# Patient Record
Sex: Female | Born: 1935 | Race: White | Hispanic: No | Marital: Single | Smoking: Never smoker
Health system: Southern US, Community
[De-identification: ages and names within clinical notes are randomized; demographics above are authoritative.]

## PROBLEM LIST (undated history)

## (undated) DIAGNOSIS — C55 Malignant neoplasm of uterus, part unspecified: Secondary | ICD-10-CM

## (undated) DIAGNOSIS — E039 Hypothyroidism, unspecified: Secondary | ICD-10-CM

## (undated) DIAGNOSIS — I6529 Occlusion and stenosis of unspecified carotid artery: Secondary | ICD-10-CM

## (undated) DIAGNOSIS — Z923 Personal history of irradiation: Secondary | ICD-10-CM

## (undated) DIAGNOSIS — G319 Degenerative disease of nervous system, unspecified: Secondary | ICD-10-CM

## (undated) DIAGNOSIS — N189 Chronic kidney disease, unspecified: Secondary | ICD-10-CM

## (undated) HISTORY — PX: EYE SURGERY: SHX253

## (undated) HISTORY — PX: ABDOMINAL HYSTERECTOMY: SHX81

## (undated) HISTORY — PX: THYROID SURGERY: SHX805

## (undated) HISTORY — DX: Malignant neoplasm of uterus, part unspecified: C55

---

## 1998-06-21 ENCOUNTER — Other Ambulatory Visit: Admission: RE | Admit: 1998-06-21 | Discharge: 1998-06-21 | Payer: Self-pay | Admitting: Family Medicine

## 2004-12-27 ENCOUNTER — Ambulatory Visit: Payer: Self-pay | Admitting: Internal Medicine

## 2005-01-27 ENCOUNTER — Other Ambulatory Visit: Payer: Self-pay

## 2005-01-27 ENCOUNTER — Emergency Department: Payer: Self-pay | Admitting: General Practice

## 2005-11-15 ENCOUNTER — Encounter: Payer: Self-pay | Admitting: Unknown Physician Specialty

## 2005-11-19 ENCOUNTER — Encounter: Payer: Self-pay | Admitting: Unknown Physician Specialty

## 2005-12-29 ENCOUNTER — Ambulatory Visit: Payer: Self-pay | Admitting: Internal Medicine

## 2006-01-08 ENCOUNTER — Ambulatory Visit: Payer: Self-pay | Admitting: Internal Medicine

## 2006-04-24 ENCOUNTER — Ambulatory Visit: Payer: Self-pay | Admitting: Gastroenterology

## 2006-07-13 ENCOUNTER — Ambulatory Visit: Payer: Self-pay | Admitting: Internal Medicine

## 2006-12-05 ENCOUNTER — Encounter: Payer: Self-pay | Admitting: Unknown Physician Specialty

## 2006-12-27 ENCOUNTER — Encounter: Payer: Self-pay | Admitting: Unknown Physician Specialty

## 2007-01-07 ENCOUNTER — Ambulatory Visit: Payer: Self-pay | Admitting: Internal Medicine

## 2007-01-08 ENCOUNTER — Ambulatory Visit: Payer: Self-pay | Admitting: Ophthalmology

## 2007-01-21 ENCOUNTER — Encounter: Payer: Self-pay | Admitting: Unknown Physician Specialty

## 2008-01-08 ENCOUNTER — Ambulatory Visit: Payer: Self-pay | Admitting: Internal Medicine

## 2008-11-18 ENCOUNTER — Ambulatory Visit: Payer: Self-pay | Admitting: Internal Medicine

## 2008-11-24 ENCOUNTER — Ambulatory Visit: Payer: Self-pay | Admitting: Unknown Physician Specialty

## 2009-01-11 ENCOUNTER — Ambulatory Visit: Payer: Self-pay | Admitting: Internal Medicine

## 2009-04-22 ENCOUNTER — Ambulatory Visit: Payer: Self-pay | Admitting: Ophthalmology

## 2009-04-28 ENCOUNTER — Ambulatory Visit: Payer: Self-pay | Admitting: Ophthalmology

## 2009-07-13 ENCOUNTER — Ambulatory Visit: Payer: Self-pay | Admitting: Gastroenterology

## 2009-08-09 ENCOUNTER — Other Ambulatory Visit: Payer: Self-pay | Admitting: Gastroenterology

## 2010-01-14 ENCOUNTER — Ambulatory Visit: Payer: Self-pay | Admitting: Internal Medicine

## 2011-01-16 ENCOUNTER — Ambulatory Visit: Payer: Self-pay | Admitting: Internal Medicine

## 2012-01-23 ENCOUNTER — Ambulatory Visit: Payer: Self-pay | Admitting: Internal Medicine

## 2013-01-21 DIAGNOSIS — R498 Other voice and resonance disorders: Secondary | ICD-10-CM | POA: Insufficient documentation

## 2013-01-23 ENCOUNTER — Ambulatory Visit: Payer: Self-pay | Admitting: Internal Medicine

## 2013-11-02 DIAGNOSIS — I739 Peripheral vascular disease, unspecified: Secondary | ICD-10-CM

## 2013-11-02 DIAGNOSIS — G250 Essential tremor: Secondary | ICD-10-CM | POA: Insufficient documentation

## 2013-11-02 DIAGNOSIS — I779 Disorder of arteries and arterioles, unspecified: Secondary | ICD-10-CM | POA: Insufficient documentation

## 2013-11-02 DIAGNOSIS — E039 Hypothyroidism, unspecified: Secondary | ICD-10-CM | POA: Insufficient documentation

## 2013-11-02 DIAGNOSIS — G629 Polyneuropathy, unspecified: Secondary | ICD-10-CM | POA: Insufficient documentation

## 2013-11-18 DIAGNOSIS — N183 Chronic kidney disease, stage 3 unspecified: Secondary | ICD-10-CM | POA: Insufficient documentation

## 2014-01-27 ENCOUNTER — Ambulatory Visit: Payer: Self-pay | Admitting: Internal Medicine

## 2014-12-01 DIAGNOSIS — F5101 Primary insomnia: Secondary | ICD-10-CM | POA: Insufficient documentation

## 2014-12-07 ENCOUNTER — Encounter: Payer: Self-pay | Admitting: *Deleted

## 2014-12-08 ENCOUNTER — Ambulatory Visit: Payer: Medicare Other | Admitting: Anesthesiology

## 2014-12-08 ENCOUNTER — Encounter: Payer: Self-pay | Admitting: Anesthesiology

## 2014-12-08 ENCOUNTER — Ambulatory Visit
Admission: RE | Admit: 2014-12-08 | Discharge: 2014-12-08 | Disposition: A | Discharge status: Home / Self Care | Payer: Medicare Other | Source: Ambulatory Visit | Attending: Gastroenterology | Admitting: Gastroenterology

## 2014-12-08 ENCOUNTER — Encounter
Admission: RE | Disposition: A | Discharge status: Home / Self Care | Payer: Self-pay | Source: Ambulatory Visit | Attending: Gastroenterology

## 2014-12-08 DIAGNOSIS — G319 Degenerative disease of nervous system, unspecified: Secondary | ICD-10-CM | POA: Diagnosis not present

## 2014-12-08 DIAGNOSIS — K5730 Diverticulosis of large intestine without perforation or abscess without bleeding: Secondary | ICD-10-CM | POA: Diagnosis not present

## 2014-12-08 DIAGNOSIS — K621 Rectal polyp: Secondary | ICD-10-CM | POA: Insufficient documentation

## 2014-12-08 DIAGNOSIS — E039 Hypothyroidism, unspecified: Secondary | ICD-10-CM | POA: Insufficient documentation

## 2014-12-08 DIAGNOSIS — Z86010 Personal history of colonic polyps: Secondary | ICD-10-CM | POA: Diagnosis present

## 2014-12-08 DIAGNOSIS — Z79899 Other long term (current) drug therapy: Secondary | ICD-10-CM | POA: Insufficient documentation

## 2014-12-08 DIAGNOSIS — D128 Benign neoplasm of rectum: Secondary | ICD-10-CM | POA: Insufficient documentation

## 2014-12-08 DIAGNOSIS — K5900 Constipation, unspecified: Secondary | ICD-10-CM | POA: Diagnosis not present

## 2014-12-08 DIAGNOSIS — K635 Polyp of colon: Secondary | ICD-10-CM | POA: Diagnosis not present

## 2014-12-08 DIAGNOSIS — Z8542 Personal history of malignant neoplasm of other parts of uterus: Secondary | ICD-10-CM | POA: Diagnosis not present

## 2014-12-08 DIAGNOSIS — N183 Chronic kidney disease, stage 3 (moderate): Secondary | ICD-10-CM | POA: Insufficient documentation

## 2014-12-08 HISTORY — PX: COLONOSCOPY WITH PROPOFOL: SHX5780

## 2014-12-08 HISTORY — DX: Occlusion and stenosis of unspecified carotid artery: I65.29

## 2014-12-08 HISTORY — DX: Hypothyroidism, unspecified: E03.9

## 2014-12-08 HISTORY — DX: Degenerative disease of nervous system, unspecified: G31.9

## 2014-12-08 HISTORY — DX: Chronic kidney disease, unspecified: N18.9

## 2014-12-08 SURGERY — COLONOSCOPY WITH PROPOFOL
Anesthesia: General

## 2014-12-08 MED ORDER — MIDAZOLAM HCL 5 MG/5ML IJ SOLN
INTRAMUSCULAR | Status: DC | PRN
  Administered 2014-12-08: 1 mg via INTRAVENOUS

## 2014-12-08 MED ORDER — PROPOFOL INFUSION 10 MG/ML OPTIME
INTRAVENOUS | Status: DC | PRN
  Administered 2014-12-08: 140 ug/kg/min via INTRAVENOUS

## 2014-12-08 MED ORDER — FENTANYL CITRATE (PF) 100 MCG/2ML IJ SOLN
INTRAMUSCULAR | Status: DC | PRN
  Administered 2014-12-08: 50 ug via INTRAVENOUS

## 2014-12-08 MED ORDER — EPHEDRINE SULFATE 50 MG/ML IJ SOLN
INTRAMUSCULAR | Status: DC | PRN
  Administered 2014-12-08 (×2): 10 mg via INTRAVENOUS

## 2014-12-08 MED ORDER — LIDOCAINE HCL (CARDIAC) 20 MG/ML IV SOLN
INTRAVENOUS | Status: DC | PRN
  Administered 2014-12-08: 30 mg via INTRAVENOUS

## 2014-12-08 MED ORDER — PROPOFOL INFUSION 10 MG/ML OPTIME: INTRAVENOUS | Status: DC | PRN

## 2014-12-08 MED ORDER — PROPOFOL 10 MG/ML IV BOLUS
INTRAVENOUS | Status: DC | PRN
  Administered 2014-12-08: 20 mg via INTRAVENOUS
  Administered 2014-12-08: 40 mg via INTRAVENOUS
  Administered 2014-12-08: 20 mg via INTRAVENOUS

## 2014-12-08 MED ORDER — SODIUM CHLORIDE 0.9 % IV SOLN
INTRAVENOUS | Status: DC
  Administered 2014-12-08: 10:00:00 via INTRAVENOUS
  Administered 2014-12-08: 1000 mL via INTRAVENOUS

## 2014-12-08 MED ORDER — SODIUM CHLORIDE 0.9 % IV SOLN: INTRAVENOUS | Status: DC

## 2014-12-08 NOTE — Anesthesia Procedure Notes (Addendum)
Date/Time: 12/08/2014 10:15 AM Performed by: Allean Found Pre-anesthesia Checklist: Patient identified, Emergency Drugs available, Suction available, Patient being monitored and Timeout performed Intubation Type: IV induction   Performed by: Marcelle Hepner Oxygen Delivery Method: Nasal cannula

## 2014-12-08 NOTE — H&P (Signed)
Outpatient short stay form Pre-procedure 12/08/2014 10:13 AM Lollie Sails MD  Primary Physician: Dr. Frazier Richards  Reason for visit:  Colonoscopy  History of present illness:  Patient is a 81 year old female with a personal history of adenomatous colon polyps. She states that about 6 months ago noticed a change in her bowel habits being more irregular area she had been taking MiraLAX previously due to constipation problems however has discontinue that agent. He does eat yogurt on a daily basis. He is seen no black stool or blood in the stools. She held her 81 mg aspirin and she does not take any anticoagulates medications. She tolerated her prep except sHe did have some emesis at the end of the prep.    Current facility-administered medications:  .  0.9 %  sodium chloride infusion, , Intravenous, Continuous, Lollie Sails, MD, Last Rate: 20 mL/hr at 12/08/14 0926 .  0.9 %  sodium chloride infusion, , Intravenous, Continuous, Lollie Sails, MD  Prescriptions prior to admission  Medication Sig Dispense Refill Last Dose  . azelastine (ASTELIN) 0.1 % nasal spray Place into both nostrils 2 (two) times daily. Use in each nostril as directed   12/06/2014  . calcium carbonate (OS-CAL) 600 MG TABS tablet Take 600 mg by mouth 2 (two) times daily with a meal.   12/06/2014  . Dextromethorphan-Quinidine 20-10 MG CAPS Take by mouth.   12/06/2014  . levothyroxine (SYNTHROID, LEVOTHROID) 75 MCG tablet Take 75 mcg by mouth daily before breakfast.   12/06/2014  . Lutein 10 MG TABS Take by mouth.   12/06/2014  . Multiple Vitamin (MULTIVITAMIN) tablet Take 1 tablet by mouth daily.   12/06/2014  . primidone (MYSOLINE) 50 MG tablet Take by mouth 4 (four) times daily.   12/06/2014  . simvastatin (ZOCOR) 40 MG tablet Take 40 mg by mouth daily.   12/07/2014 at 0600     No Known Allergies   Past Medical History  Diagnosis Date  . Carotid stenosis   . Cancer     uterine  . Neuro-degenerative  disorders   . Chronic kidney disease     Stage 3, GFR30-59 ml-min  . CKD (chronic kidney disease)   . Hypothyroidism     Review of systems:      Physical Exam    Heart and lungs: Regular rate and rhythm without rub or gallop lungs bilaterally clear    HEENT: Normocephalic atraumatic eyes are anicteric    Other:     Pertinant exam for procedure: Soft nontender nondistended bowel sounds positive normoactive    Planned proceedures: Anoscopy and indicated procedures I have discussed the risks benefits and complications of procedures to include not limited to bleeding, infection, perforation and the risk of sedation and the patient wishes to proceed.    Lollie Sails, MD Gastroenterology 12/08/2014  10:13 AM

## 2014-12-08 NOTE — Op Note (Signed)
[facility] Gastroenterology Patient Name: Shelley Mahoney Procedure Date: 12/08/2014 9:49 AM MRN: [id number] Account #: [id number] Date of Birth: 16-Jul-1931 Admit Type: Outpatient Age: 81 Room: [facility] ENDO ROOM 3 Gender: Female Note Status: Finalized Procedure:         Colonoscopy Indications:       Personal history of colonic polyps, Change in bowel habits Providers:         Lollie Sails, MD Referring MD:      Ocie Cornfield. Ouida Sills, MD (Referring MD) Medicines:         Monitored Anesthesia Care Complications:     No immediate complications. Procedure:         Pre-Anesthesia Assessment:                    - ASA Grade Assessment: III - A patient with severe                     systemic disease.                    After obtaining informed consent, the colonoscope was                     passed under direct vision. Throughout the procedure, the                     patient's blood pressure, pulse, and oxygen saturations                     were monitored continuously. The Colonoscope was                     introduced through the anus and advanced to the the cecum,                     identified by appendiceal orifice and ileocecal valve. The                     quality of the bowel preparation was good. Findings:      Two sessile polyps were found in the rectum. The polyps were 1 to 2 mm       in size. These polyps were removed with a cold biopsy forceps. Resection       and retrieval were complete.      A 1 mm polyp was found in the transverse colon. The polyp was sessile.       The polyp was removed with a cold biopsy forceps. Resection and       retrieval were complete.      A 3 mm polyp was found in the rectum At the anal verge. The polyp was       sessile. The polyp was removed with a cold biopsy forceps. Resection and       retrieval were complete.      Two flat polyps were found in the rectum. The polyps were 1 to 2 mm in       size. These polyps were  removed with a cold biopsy forceps. Resection       and retrieval were complete.      Biopsies for histology were taken with a cold forceps from the right       colon and left colon for evaluation of microscopic colitis.      Multiple small-mouthed diverticula were found in the sigmoid  colon and       in the descending colon.      The retroflexed view of the distal rectum and anal verge was normal and       showed no anal or rectal abnormalities otherwise. Impression:        - Two 1 to 2 mm polyps in the rectum. Resected and                     retrieved.                    - One 1 mm polyp in the transverse colon. Resected and                     retrieved.                    - One 3 mm polyp in the rectum. Resected and retrieved.                    - Two 1 to 2 mm polyps in the rectum. Resected and                     retrieved.                    - Diverticulosis in the sigmoid colon and in the                     descending colon.                    - The distal rectum and anal verge are normal on                     retroflexion view.                    - Biopsies were taken with a cold forceps from the right                     colon and left colon for evaluation of microscopic colitis. Recommendation:    - Await pathology results.                    - [place] Procedure Code(s): --- Professional ---                    [phone], Colonoscopy, flexible; with biopsy, single or                     multiple Diagnosis Code(s): --- Professional ---                    569.0, Anal and rectal polyp                    211.3, Benign neoplasm of colon                    V12.72, Personal history of colonic polyps                    787.99, Other symptoms involving digestive system                    562.10, Diverticulosis of colon (without mention of  hemorrhage) CPT copyright 2014 American Medical Association. All rights reserved. The codes documented in this  report are preliminary and upon coder review may  be revised to meet current compliance requirements. Lollie Sails, MD 12/08/2014 10:54:43 AM This report has been signed electronically. Number of Addenda: 0 Note Initiated On: 12/08/2014 9:49 AM Scope Withdrawal Time: 0 hours 14 minutes 14 seconds  Total Procedure Duration: 0 hours 28 minutes 27 seconds       [facility]

## 2014-12-08 NOTE — Anesthesia Postprocedure Evaluation (Signed)
  Anesthesia Post-op Note  Patient: Shelley Mahoney  Procedure(s) Performed: Procedure(s): COLONOSCOPY WITH PROPOFOL (N/A)  Anesthesia type:General  Patient location: PACU  Post pain: Pain level controlled  Post assessment: Post-op Vital signs reviewed, Patient's Cardiovascular Status Stable, Respiratory Function Stable, Patent Airway and No signs of Nausea or vomiting  Post vital signs: Reviewed and stable  Last Vitals:  Filed Vitals:   12/08/14 1130  BP: 123/64  Pulse: 72  Temp:   Resp: 13    Level of consciousness: awake, alert  and patient cooperative  Complications: No apparent anesthesia complications

## 2014-12-08 NOTE — Transfer of Care (Signed)
Immediate Anesthesia Transfer of Care Note  Patient: Shelley Mahoney  Procedure(s) Performed: Procedure(s): COLONOSCOPY WITH PROPOFOL (N/A)  Patient Location: PACU  Anesthesia Type:General  Level of Consciousness: awake  Airway & Oxygen Therapy: Patient Spontanous Breathing and Patient connected to nasal cannula oxygen  Post-op Assessment: Report given to RN and Post -op Vital signs reviewed and stable  Post vital signs: Reviewed and stable  Last Vitals:  Filed Vitals:   12/08/14 0914  BP: 114/95  Pulse: 76  Temp: 36 C  Resp: 16    Complications: No apparent anesthesia complications

## 2014-12-08 NOTE — Anesthesia Preprocedure Evaluation (Addendum)
Anesthesia Evaluation  Patient identified by MRN, date of birth, ID band Patient awake    Reviewed: Allergy & Precautions, H&P , NPO status , Patient's Chart, lab work & pertinent test results, reviewed documented beta blocker date and time   Airway Mallampati: III  TM Distance: >3 FB Neck ROM: limited    Dental no notable dental hx. (+) Teeth Intact   Pulmonary neg pulmonary ROS,  breath sounds clear to auscultation  Pulmonary exam normal       Cardiovascular negative cardio ROS Normal cardiovascular examRhythm:regular Rate:Normal     Neuro/Psych negative neurological ROS  negative psych ROS   GI/Hepatic negative GI ROS, Neg liver ROS,   Endo/Other  Hypothyroidism   Renal/GU CRFRenal disease  negative genitourinary   Musculoskeletal   Abdominal   Peds  Hematology negative hematology ROS (+)   Anesthesia Other Findings Past Medical History:   Carotid stenosis                                             Cancer                                                         Comment:uterine   Neuro-degenerative disorders                                 Chronic kidney disease                                         Comment:Stage 3, GFR30-59 ml-min   CKD (chronic kidney disease)                                 Hypothyroidism                                               Reproductive/Obstetrics negative OB ROS                             Anesthesia Physical Anesthesia Plan  ASA: III  Anesthesia Plan: General   Post-op Pain Management:    Induction:   Airway Management Planned:   Additional Equipment:   Intra-op Plan:   Post-operative Plan:   Informed Consent: I have reviewed the patients History and Physical, chart, labs and discussed the procedure including the risks, benefits and alternatives for the proposed anesthesia with the patient or authorized representative who has indicated  his/her understanding and acceptance.   Dental Advisory Given  Plan Discussed with: Anesthesiologist, CRNA and Surgeon  Anesthesia Plan Comments:        Anesthesia Quick Evaluation

## 2014-12-10 LAB — SURGICAL PATHOLOGY

## 2014-12-14 ENCOUNTER — Encounter: Payer: Self-pay | Admitting: Gastroenterology

## 2016-01-13 DIAGNOSIS — G2581 Restless legs syndrome: Secondary | ICD-10-CM | POA: Insufficient documentation

## 2016-02-08 DIAGNOSIS — Z0000 Encounter for general adult medical examination without abnormal findings: Secondary | ICD-10-CM | POA: Insufficient documentation

## 2016-03-28 ENCOUNTER — Other Ambulatory Visit: Payer: Self-pay | Admitting: Internal Medicine

## 2016-03-28 DIAGNOSIS — R918 Other nonspecific abnormal finding of lung field: Secondary | ICD-10-CM

## 2016-03-31 ENCOUNTER — Ambulatory Visit
Admission: RE | Admit: 2016-03-31 | Discharge: 2016-03-31 | Disposition: A | Discharge status: Home / Self Care | Payer: Medicare Other | Source: Ambulatory Visit | Attending: Internal Medicine | Admitting: Internal Medicine

## 2016-03-31 DIAGNOSIS — R932 Abnormal findings on diagnostic imaging of liver and biliary tract: Secondary | ICD-10-CM | POA: Diagnosis not present

## 2016-03-31 DIAGNOSIS — R918 Other nonspecific abnormal finding of lung field: Secondary | ICD-10-CM | POA: Insufficient documentation

## 2016-03-31 MED ORDER — IOPAMIDOL (ISOVUE-300) INJECTION 61%
75.0000 mL | Freq: Once | INTRAVENOUS | Status: AC | PRN
  Administered 2016-03-31: 75 mL via INTRAVENOUS

## 2016-04-11 ENCOUNTER — Inpatient Hospital Stay: Payer: Medicare Other | Attending: Internal Medicine | Admitting: Internal Medicine

## 2016-04-11 ENCOUNTER — Other Ambulatory Visit: Payer: Self-pay

## 2016-04-11 ENCOUNTER — Telehealth: Payer: Self-pay | Admitting: Internal Medicine

## 2016-04-11 ENCOUNTER — Encounter: Payer: Self-pay | Admitting: Internal Medicine

## 2016-04-11 DIAGNOSIS — N183 Chronic kidney disease, stage 3 (moderate): Secondary | ICD-10-CM | POA: Insufficient documentation

## 2016-04-11 DIAGNOSIS — Z90710 Acquired absence of both cervix and uterus: Secondary | ICD-10-CM

## 2016-04-11 DIAGNOSIS — R918 Other nonspecific abnormal finding of lung field: Secondary | ICD-10-CM | POA: Diagnosis present

## 2016-04-11 DIAGNOSIS — Z8542 Personal history of malignant neoplasm of other parts of uterus: Secondary | ICD-10-CM | POA: Diagnosis not present

## 2016-04-11 DIAGNOSIS — J984 Other disorders of lung: Secondary | ICD-10-CM

## 2016-04-11 DIAGNOSIS — Z79899 Other long term (current) drug therapy: Secondary | ICD-10-CM | POA: Diagnosis not present

## 2016-04-11 DIAGNOSIS — Z7982 Long term (current) use of aspirin: Secondary | ICD-10-CM | POA: Diagnosis not present

## 2016-04-11 DIAGNOSIS — E039 Hypothyroidism, unspecified: Secondary | ICD-10-CM | POA: Diagnosis not present

## 2016-04-11 NOTE — Telephone Encounter (Signed)
Shelley Mahoney- Can you please check if pt could be seen sooner? Thx

## 2016-04-11 NOTE — Progress Notes (Signed)
  Oncology Nurse Navigator Documentation Per Dr. Rogue Bussing referral sent to Dr. Mortimer Fries for ENB biopsy of lung mass. Will follow up on appt. She has already been placed on tumor conference for 11/29 as requested.  Navigator Location: [facility] (04/11/16 1400)   )Navigator Encounter Type: Other (04/11/16 1400)                       Treatment Phase: Abnormal Scans (04/11/16 1400) Barriers/Navigation Needs: Coordination of Care (04/11/16 1400)   Interventions: Coordination of Care;Referrals (04/11/16 1400)   Coordination of Care:  (ENB) (04/11/16 1400)                  Time Spent with Patient: 15 (04/11/16 1400)

## 2016-04-11 NOTE — Progress Notes (Signed)
Patient is here today for a new evaluation

## 2016-04-11 NOTE — Progress Notes (Signed)
[place] NOTE  Patient Care Team: Kirk Ruths, MD as PCP - General (Internal Medicine)  CHIEF COMPLAINTS/PURPOSE OF CONSULTATION:    # NOV 2017- Left Upper Lobe lung mass 2.8 x 2.6 cm spiculated nodule [incidental]  # UTERINE CANCER Shelley Mahoney; [id number] s/p TAH & BSO s/p RT; No chemo]; Essential Tremor   No history exists.     HISTORY OF PRESENTING ILLNESS:  Shelley Mahoney 82 y.o.  female with a remote history of uterine cancer noted to have incidental left upper lobe lung nodule- referred to [place] for further evaluation and recommendations. Patient was in [place] for mission through church- when she had episode of gastroenteritis; when she had a chest x-ray at that showed the lung nodule. This further followed by further imaging with the PCP that showed a lung mass.   Patient denies history of smoking. No cough no dyspnea; no swelling in legs. She has chronic essential benign tremors.   ROS: A complete 10 point review of system is done which is negative except mentioned above in history of present illness  MEDICAL HISTORY:  Past Medical History:  Diagnosis Date  . Carotid stenosis   . Chronic kidney disease    Stage 3, GFR30-59 ml-min  . CKD (chronic kidney disease)   . Hypothyroidism   . Neuro-degenerative disorders   . Uterine cancer [facility])     SURGICAL HISTORY: Past Surgical History:  Procedure Laterality Date  . ABDOMINAL HYSTERECTOMY    . COLONOSCOPY WITH PROPOFOL N/A 12/08/2014   Procedure: COLONOSCOPY WITH PROPOFOL;  Surgeon: Lollie Sails, MD;  Location: [facility] ENDOSCOPY;  Service: Endoscopy;  Laterality: N/A;  . EYE SURGERY     cataracts ou  . THYROID SURGERY      SOCIAL HISTORY: no smoking/ no alcohol; lives in [place]. 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  Social History   Social History  . Marital status: Single    Spouse name: N/A  . Number of children: N/A  . Years of education: N/A   Occupational History   . Not on file.   Social History Main Topics  . Smoking status: Never Smoker  . Smokeless tobacco: Never Used  . Alcohol use No  . Drug use: No  . Sexual activity: Not on file   Other Topics Concern  . Not on file   Social History Narrative  . No narrative on file    FAMILY HISTORY: mom- breast early stage/ 90s.  Family History  Problem Relation Age of Onset  . Breast cancer Mother   . Other Father     erythrocytosis   . Colon cancer Maternal Grandmother     ALLERGIES:  has No Known Allergies.  MEDICATIONS:  Current Outpatient Prescriptions  Medication Sig Dispense Refill  . ASPIRIN 81 PO Take 81 mg by mouth daily.    Marland Kitchen azelastine (ASTELIN) 0.1 % nasal spray Place into both nostrils 2 (two) times daily. Use in each nostril as directed    . cholecalciferol (VITAMIN D) 1000 units tablet Take 1,000 Units by mouth daily.    Marland Kitchen Dextromethorphan-Quinidine 20-10 MG CAPS Take by mouth.    . gabapentin (NEURONTIN) 100 MG capsule Take by mouth.    . levothyroxine (SYNTHROID, LEVOTHROID) 75 MCG tablet Take 75 mcg by mouth daily before breakfast.    . Lutein 10 MG TABS Take by mouth.    Marland Kitchen MAGNESIUM GLUCONATE PO Take 400 mg by mouth 2 (two) times daily.    Marland Kitchen  Multiple Vitamin (MULTIVITAMIN) tablet Take 1 tablet by mouth daily.    . primidone (MYSOLINE) 50 MG tablet Take by mouth 3 (three) times daily.     . Probiotic Product (Dyess) Take by mouth.    . simvastatin (ZOCOR) 40 MG tablet Take 40 mg by mouth daily.    Marland Kitchen zolpidem (AMBIEN) 5 MG tablet Take 5 mg by mouth daily as needed.      No current facility-administered medications for this visit.       Marland Kitchen  PHYSICAL EXAMINATION: ECOG PERFORMANCE STATUS: 0 - Asymptomatic  Vitals:   04/11/16 1103  BP: 120/71  Pulse: 73  Resp: 18  Temp: 97.3 F (36.3 C)   Filed Weights   04/11/16 1103  Weight: 165 lb 10.8 oz (75.1 kg)    GENERAL: Well-nourished well-developed; Alert, no distress and comfortable.   With  friend. Essential tremors noted. Patient is able to sit on the exam table by herself. EYES: no pallor or icterus OROPHARYNX: no thrush or ulceration; good dentition  NECK: supple, no masses felt LYMPH:  no palpable lymphadenopathy in the cervical, axillary or inguinal regions LUNGS: clear to auscultation and  No wheeze or crackles HEART/CVS: regular rate & rhythm and no murmurs; No lower extremity edema ABDOMEN: abdomen soft, non-tender and normal bowel sounds Musculoskeletal:no cyanosis of digits and no clubbing  PSYCH: alert & oriented x 3 with fluent speech NEURO: no focal motor/sensory deficits SKIN:  no rashes or significant lesions  LABORATORY DATA:  I have reviewed the data as listed No results found for: WBC, HGB, HCT, MCV, PLT No results for input(s): NA, K, CL, CO2, GLUCOSE, BUN, CREATININE, CALCIUM, GFRNONAA, GFRAA, PROT, ALBUMIN, AST, ALT, ALKPHOS, BILITOT, BILIDIR, IBILI in the last 8760 hours.  RADIOGRAPHIC STUDIES: I have personally reviewed the radiological images as listed and agreed with the findings in the report. Ct Chest W Contrast  Result Date: 03/31/2016 CLINICAL DATA:  History of uterine carcinoma with hysterectomy, abnormal chest x-ray EXAM: CT CHEST WITH CONTRAST TECHNIQUE: Multidetector CT imaging of the chest was performed during intravenous contrast administration. CONTRAST:  34m ISOVUE-300 IOPAMIDOL (ISOVUE-300) INJECTION 61% COMPAR[facility]  [facility] FINDINGS: Cardiovascular: The heart is within normal limits in size. No pericardial effusion is seen. The mid ascending thoracic aorta measures 30 mm in diameter. The pulmonary arteries are well opacified with no central abnormality noted. Mediastinum/Nodes: No mediastinal or hilar adenopathy is seen. Lungs/Pleura: On lung window images, there is a spiculated solid appearing mass within the anterior inferior left upper lobe of 2.8 x 2.3 x 2.6 cm, consistent with primary  lung carcinoma. There is a linear extension from this lesion toward the pleura and also toward the right heart border. A 3 mm noncalcified nodule is noted subpleural in location within the left lower lobe on image 118 series 3. Calcified granuloma is present within the right lower lobe, and there are calcified right hilar nodes from prior granulomatous disease. No additional lung lesion is seen. No pleural effusion is noted. The central airway is patent. Upper Abdomen: There are several low-attenuation liver lesions most consistent with cysts but difficult to characterize on this CT chest exam. No calcified gallstones are seen. The pancreas appears unremarkable. The adrenal glands are symmetrical and normal. Musculoskeletal: The thoracic vertebrae are in normal alignment with mild degenerative disc disease within the mid to lower thoracic spine. IMPRESSION: 1. 2.8 cm irregularly marginated solid appearing lesion within the anterior inferior left  upper lobe consistent with primary lung carcinoma. 2. 3 mm noncalcified nodule subpleural within the left lower lobe. 3. Changes of prior granulomatous disease with calcified right lower lobe granuloma and calcified right hilar nodes. 4. Probable small hepatic cysts, difficult to characterize on this exam. Electronically Signed   By: Ivar Drape M.D.   On: 03/31/2016 16:23    ASSESSMENT & PLAN:   Mass of upper lobe of left lung # Left upper lobe lung mass highly concerning for malignancy based on imaging. Recommend a PET scan for further evaluation. Discussed with Dr.Kasa from pulmonary; who feels ENB could be done to confirm the diagnosis of malignancy.  # Discussed with the patient that if it is truly malignancy the option would be surgery versus radiation [if patient is not a surgical candidate for any reason]. Based upon current imaging- appears to be stage I malignancy. We'll check PFT. Will also present the tumor conference next week.   # Will refer pt to  Dr.Kasa; follow-up with me in 2 weeks/ labs. PET scan ASAP.  Ph: [phone] [cell/ leaving to [facility] tomorrow].   All questions were answered. The patient knows to call the clinic with any problems, questions or concerns.   Cammie Sickle, MD 04/11/2016 1:28 PM

## 2016-04-11 NOTE — Telephone Encounter (Signed)
Call from [facility]. They have pt scheduled to see Dr. Alva Garnet 11/30 @ 12 noon. I contacted pt and lvm instructing her of appt date and time and that they are located on lower level of [facility]. I also left their phone number: [phone].

## 2016-04-11 NOTE — Telephone Encounter (Signed)
Spoke with Misty at [facility]. Dr. Alva Garnet can see her 11/27 at 1530. I have left Ms. Yeung a VM on her cell as requested regarding the change in her appt. Phone number left for callback with any questions.

## 2016-04-11 NOTE — Assessment & Plan Note (Addendum)
#   Left upper lobe lung mass highly concerning for malignancy based on imaging. Recommend a PET scan for further evaluation. Discussed with Dr.Kasa from pulmonary; who feels ENB could be done to confirm the diagnosis of malignancy.  # Discussed with the patient that if it is truly malignancy the option would be surgery versus radiation [if patient is not a surgical candidate for any reason]. Based upon current imaging- appears to be stage I malignancy. We'll check PFT. Will also present the tumor conference next week.   # Will refer pt to Dr.Kasa; follow-up with me in 2 weeks/ labs. PET scan ASAP.  Ph: [phone] [cell/ leaving to [facility] tomorrow].

## 2016-04-17 ENCOUNTER — Encounter: Payer: Self-pay | Admitting: Pulmonary Disease

## 2016-04-17 ENCOUNTER — Ambulatory Visit (INDEPENDENT_AMBULATORY_CARE_PROVIDER_SITE_OTHER): Payer: Medicare Other | Admitting: Pulmonary Disease

## 2016-04-17 VITALS — BP 128/78 | HR 79 | Ht 69.0 in | Wt 166.2 lb

## 2016-04-17 DIAGNOSIS — R918 Other nonspecific abnormal finding of lung field: Secondary | ICD-10-CM

## 2016-04-17 NOTE — Patient Instructions (Signed)
Bronchoscopy is scheduled for 1 PM Friday Dec 1 Stop aspirin now until the procedure is done  I will call you the next week when we know results of the biopsies  I have not scheduled follow up but if we need to, we will arrange follow up

## 2016-04-19 ENCOUNTER — Encounter
Admission: RE | Admit: 2016-04-19 | Discharge: 2016-04-19 | Disposition: A | Discharge status: Home / Self Care | Payer: Medicare Other | Source: Ambulatory Visit | Attending: Internal Medicine | Admitting: Internal Medicine

## 2016-04-19 DIAGNOSIS — J984 Other disorders of lung: Secondary | ICD-10-CM | POA: Diagnosis present

## 2016-04-19 DIAGNOSIS — R918 Other nonspecific abnormal finding of lung field: Secondary | ICD-10-CM

## 2016-04-19 LAB — GLUCOSE, CAPILLARY: GLUCOSE-CAPILLARY: 89 mg/dL (ref 65–99)

## 2016-04-19 MED ORDER — FLUDEOXYGLUCOSE F - 18 (FDG) INJECTION
13.0000 | Freq: Once | INTRAVENOUS | Status: AC | PRN
  Administered 2016-04-19: 12.35 via INTRAVENOUS

## 2016-04-19 NOTE — Progress Notes (Signed)
PULMONARY CONSULT NOTE  Requesting MD/Service: Rogue Bussing Date of initial consultation: 04/17/16 Reason for consultation: Lung mass  PT PROFILE: 67 F never smoker referred for eval of newly discovered lingular lung mass  HPI:  43 F who was on a chruch mission to [place] recently and suffered a GI illness. In the evaluation of this, she underwent a CXR which revealed a L lung mass. She returned to the [place] and was referred to Dr Rogue Bussing who has ordered a PET scan and referred to me for diagnostic bronchoscopy. She has no prior pulmonary disease and denies all chest and respiratory symptoms. She also has noted no unexplained weight loss.   Past Medical History:  Diagnosis Date  . Carotid stenosis   . Chronic kidney disease    Stage 3, GFR30-59 ml-min  . CKD (chronic kidney disease)   . Hypothyroidism   . Neuro-degenerative disorders   . Uterine cancer (HCC)   S/P thyroidectomy for thyroid cyst - reportedly noncancerous  Past Surgical History:  Procedure Laterality Date  . ABDOMINAL HYSTERECTOMY    . COLONOSCOPY WITH PROPOFOL N/A 12/08/2014   Procedure: COLONOSCOPY WITH PROPOFOL;  Surgeon: Lollie Sails, MD;  Location: [facility] ENDOSCOPY;  Service: Endoscopy;  Laterality: N/A;  . EYE SURGERY     cataracts ou  . THYROID SURGERY      MEDICATIONS: I have reviewed all medications and confirmed regimen as documented  Social History   Social History  . Marital status: Single    Spouse name: N/A  . Number of children: N/A  . Years of education: N/A   Occupational History  . Not on file.   Social History Main Topics  . Smoking status: Never Smoker  . Smokeless tobacco: Never Used  . Alcohol use No  . Drug use: No  . Sexual activity: Not on file   Other Topics Concern  . Not on file   Social History Narrative  . No narrative on file    Family History  Problem Relation Age of Onset  . Breast cancer Mother   . Other Father     erythrocytosis   . Colon cancer  Maternal Grandmother     ROS: No fever, myalgias/arthralgias, unexplained weight loss or weight gain No new focal weakness or sensory deficits No otalgia, hearing loss, visual changes, nasal and sinus symptoms, mouth and throat problems No neck pain or adenopathy No abdominal pain, N/V/D, diarrhea, change in bowel pattern No dysuria, change in urinary pattern   Vitals:   04/17/16 1543  BP: 128/78  Pulse: 79  SpO2: 100%  Weight: 166 lb 3.2 oz (75.4 kg)  Height: '5\' 9"'$  (1.753 m)     EXAM:  Gen: WDWN, No overt respiratory distress HEENT: NCAT, sclera white, oropharynx normal Neck: Supple without LAN, thyromegaly, JVD Lungs: breath sounds: full, percussion: normal, No adventitious sounds Cardiovascular: RRR, no murmurs noted Abdomen: Soft, nontender, normal BS Ext: without clubbing, cyanosis, edema Neuro: CNs grossly intact, motor and sensory intact Skin: Limited exam, no lesions noted  DATA:   No flowsheet data found.  No flowsheet data found.  CT chest 03/31/16:  3 cm lingular mass. Prior granulomatous disease  IMPRESSION:   Lung mass - appearance worrisome for malignancy. Given evidence of old granulomatous disease and given her history of repeated trips to endemic areas, this could be a tuberculoma  PLAN:  1) Bronchoscopy scheduled for 04/21/16. I have discussed the procedure, its indications, risks and alternatives in detail with the patient 2) Although it  wasn't done this visit, we will get a quantiferon gold blood test 12/01 3) Follow up will be determined after procedure if needed   Merton Border, MD PCCM service Mobile [phone] Pager [phone] 04/19/2016

## 2016-04-20 ENCOUNTER — Institutional Professional Consult (permissible substitution): Payer: Medicare Other | Admitting: Pulmonary Disease

## 2016-04-20 ENCOUNTER — Telehealth: Payer: Self-pay | Admitting: Pulmonary Disease

## 2016-04-20 DIAGNOSIS — G4761 Periodic limb movement disorder: Secondary | ICD-10-CM | POA: Insufficient documentation

## 2016-04-20 DIAGNOSIS — E611 Iron deficiency: Secondary | ICD-10-CM | POA: Insufficient documentation

## 2016-04-20 NOTE — Telephone Encounter (Signed)
I have made pt aware of DS recommendations (verbally). Pt aware and voiced understanding. Pt also had questions about her upcoming PFT, pt wanted to know if DS has communicated this with Dr. Jacinto Reap?  DS please advise. Thanks.

## 2016-04-20 NOTE — Telephone Encounter (Signed)
Pt calling stating she has a Bronc scheduled Friday and she was to stop taking her baby Asprin...and she did not take it Tuesday. But on accidentally took it on Wednesday Would like to know if it is still okay to do the bronc. Please advise.

## 2016-04-21 ENCOUNTER — Ambulatory Visit: Payer: Medicare Other

## 2016-04-21 ENCOUNTER — Ambulatory Visit
Admission: RE | Admit: 2016-04-21 | Discharge: 2016-04-21 | Disposition: A | Discharge status: Home / Self Care | Payer: Medicare Other | Source: Ambulatory Visit | Attending: Pulmonary Disease | Admitting: Pulmonary Disease

## 2016-04-21 ENCOUNTER — Encounter: Payer: Self-pay | Admitting: *Deleted

## 2016-04-21 ENCOUNTER — Encounter
Admission: RE | Disposition: A | Discharge status: Home / Self Care | Payer: Self-pay | Source: Ambulatory Visit | Attending: Pulmonary Disease

## 2016-04-21 DIAGNOSIS — Z8542 Personal history of malignant neoplasm of other parts of uterus: Secondary | ICD-10-CM | POA: Diagnosis not present

## 2016-04-21 DIAGNOSIS — Z9889 Other specified postprocedural states: Secondary | ICD-10-CM

## 2016-04-21 DIAGNOSIS — C3412 Malignant neoplasm of upper lobe, left bronchus or lung: Secondary | ICD-10-CM | POA: Insufficient documentation

## 2016-04-21 DIAGNOSIS — N189 Chronic kidney disease, unspecified: Secondary | ICD-10-CM | POA: Diagnosis not present

## 2016-04-21 DIAGNOSIS — R918 Other nonspecific abnormal finding of lung field: Secondary | ICD-10-CM | POA: Diagnosis not present

## 2016-04-21 HISTORY — PX: FLEXIBLE BRONCHOSCOPY: SHX5094

## 2016-04-21 SURGERY — BRONCHOSCOPY, FLEXIBLE
Anesthesia: Moderate Sedation

## 2016-04-21 MED ORDER — FENTANYL CITRATE (PF) 100 MCG/2ML IJ SOLN
INTRAMUSCULAR | Status: DC | PRN
  Administered 2016-04-21 (×2): 25 ug via INTRAVENOUS

## 2016-04-21 MED ORDER — FENTANYL CITRATE (PF) 100 MCG/2ML IJ SOLN
INTRAMUSCULAR | Status: AC
  Filled 2016-04-21: qty 2

## 2016-04-21 MED ORDER — MIDAZOLAM HCL 2 MG/2ML IJ SOLN
INTRAMUSCULAR | Status: DC | PRN
  Administered 2016-04-21: 2 mg via INTRAVENOUS

## 2016-04-21 MED ORDER — BUTAMBEN-TETRACAINE-BENZOCAINE 2-2-14 % EX AERO
INHALATION_SPRAY | CUTANEOUS | Status: AC
  Filled 2016-04-21: qty 20

## 2016-04-21 MED ORDER — MIDAZOLAM HCL 2 MG/2ML IJ SOLN
INTRAMUSCULAR | Status: AC
  Filled 2016-04-21: qty 10

## 2016-04-21 MED ORDER — PHENYLEPHRINE HCL 0.25 % NA SOLN
2.0000 | Freq: Once | NASAL | Status: DC
  Filled 2016-04-21: qty 15

## 2016-04-21 MED ORDER — LIDOCAINE HCL 2 % EX GEL: 1.0000 "application " | Freq: Once | CUTANEOUS | Status: DC

## 2016-04-21 MED ORDER — BUTAMBEN-TETRACAINE-BENZOCAINE 2-2-14 % EX AERO
1.0000 | INHALATION_SPRAY | Freq: Once | CUTANEOUS | Status: DC
  Filled 2016-04-21: qty 20

## 2016-04-21 MED ORDER — MIDAZOLAM HCL 5 MG/5ML IJ SOLN
INTRAMUSCULAR | Status: DC | PRN
  Administered 2016-04-21: 1 mg via INTRAVENOUS

## 2016-04-21 NOTE — H&P (View-Only) (Signed)
PULMONARY CONSULT NOTE  Requesting MD/Service: Rogue Bussing Date of initial consultation: 04/17/16 Reason for consultation: Lung mass  PT PROFILE: 42 F never smoker referred for eval of newly discovered lingular lung mass  HPI:  65 F who was on a chruch mission to [place] recently and suffered a GI illness. In the evaluation of this, she underwent a CXR which revealed a L lung mass. She returned to the [place] and was referred to Dr Rogue Bussing who has ordered a PET scan and referred to me for diagnostic bronchoscopy. She has no prior pulmonary disease and denies all chest and respiratory symptoms. She also has noted no unexplained weight loss.   Past Medical History:  Diagnosis Date  . Carotid stenosis   . Chronic kidney disease    Stage 3, GFR30-59 ml-min  . CKD (chronic kidney disease)   . Hypothyroidism   . Neuro-degenerative disorders   . Uterine cancer (HCC)   S/P thyroidectomy for thyroid cyst - reportedly noncancerous  Past Surgical History:  Procedure Laterality Date  . ABDOMINAL HYSTERECTOMY    . COLONOSCOPY WITH PROPOFOL N/A 12/08/2014   Procedure: COLONOSCOPY WITH PROPOFOL;  Surgeon: Lollie Sails, MD;  Location: [facility] ENDOSCOPY;  Service: Endoscopy;  Laterality: N/A;  . EYE SURGERY     cataracts ou  . THYROID SURGERY      MEDICATIONS: I have reviewed all medications and confirmed regimen as documented  Social History   Social History  . Marital status: Single    Spouse name: N/A  . Number of children: N/A  . Years of education: N/A   Occupational History  . Not on file.   Social History Main Topics  . Smoking status: Never Smoker  . Smokeless tobacco: Never Used  . Alcohol use No  . Drug use: No  . Sexual activity: Not on file   Other Topics Concern  . Not on file   Social History Narrative  . No narrative on file    Family History  Problem Relation Age of Onset  . Breast cancer Mother   . Other Father     erythrocytosis   . Colon cancer  Maternal Grandmother     ROS: No fever, myalgias/arthralgias, unexplained weight loss or weight gain No new focal weakness or sensory deficits No otalgia, hearing loss, visual changes, nasal and sinus symptoms, mouth and throat problems No neck pain or adenopathy No abdominal pain, N/V/D, diarrhea, change in bowel pattern No dysuria, change in urinary pattern   Vitals:   04/17/16 1543  BP: 128/78  Pulse: 79  SpO2: 100%  Weight: 166 lb 3.2 oz (75.4 kg)  Height: '5\' 9"'$  (1.753 m)     EXAM:  Gen: WDWN, No overt respiratory distress HEENT: NCAT, sclera white, oropharynx normal Neck: Supple without LAN, thyromegaly, JVD Lungs: breath sounds: full, percussion: normal, No adventitious sounds Cardiovascular: RRR, no murmurs noted Abdomen: Soft, nontender, normal BS Ext: without clubbing, cyanosis, edema Neuro: CNs grossly intact, motor and sensory intact Skin: Limited exam, no lesions noted  DATA:   No flowsheet data found.  No flowsheet data found.  CT chest 03/31/16:  3 cm lingular mass. Prior granulomatous disease  IMPRESSION:   Lung mass - appearance worrisome for malignancy. Given evidence of old granulomatous disease and given her history of repeated trips to endemic areas, this could be a tuberculoma  PLAN:  1) Bronchoscopy scheduled for 04/21/16. I have discussed the procedure, its indications, risks and alternatives in detail with the patient 2) Although it  wasn't done this visit, we will get a quantiferon gold blood test 12/01 3) Follow up will be determined after procedure if needed   Merton Border, MD PCCM service Mobile [phone] Pager [phone] 04/19/2016

## 2016-04-21 NOTE — Procedures (Signed)
Indication:   Lung mass  Premedication:   Fentanyl 50 mcg Midaz 3 mg  Anesthesia: Topical to nose and throat 30 cc of 1% lidocaine used during the course of procedure  Procedure: After adequate sedation and anesthesia, the bronchoscope was introduced via the R naris and advanced into the posterior pharynx. Further anesthesia was obtained with 1% lidocaine and the scope was advanced into the trachea. Complete airway anesthesia was achieved with 1% lidocaine and a thorough airway examination was performed. This revealed the following findings:  Findings:  Upper airway - normal Tracheobronchial anatomy - normal Bronchial mucosa - normal Other - no abnormalities noted on airway exam  Specimens:   BAL from lingula Cytology brushings from lingula Transbronchial biopsy (with flouroscopic guidance) from lingula (7 or 8 passes)   Post procedure evaluation:  The patient tolerated the procedure well with no moderate bleeding that resolved prior to removal of the scope CXR - pending   Merton Border, MD PCCM service Mobile [phone] Pager [phone]  04/21/2016

## 2016-04-21 NOTE — Interval H&P Note (Signed)
History and Physical Interval Note:  04/21/2016 1:12 PM  Shelley Mahoney  has presented today for surgery, with the diagnosis of Flexible bronchoscopy per Tammy B    [facility] Yes C-arm Yes per [facility]     The various methods of treatment have been discussed with the patient and family. After consideration of risks, benefits and other options for treatment, the patient has consented to  Procedure(s): FLEXIBLE BRONCHOSCOPY (N/A) as a surgical intervention .  The patient's history has been reviewed, patient examined, no change in status, stable for surgery.  I have reviewed the patient's chart and labs.  Questions were answered to the patient's satisfaction.     Wilhelmina Mcardle

## 2016-04-22 LAB — ACID FAST SMEAR (AFB): ACID FAST SMEAR - AFSCU2: NEGATIVE

## 2016-04-22 LAB — ACID FAST SMEAR (AFB, MYCOBACTERIA)

## 2016-04-24 ENCOUNTER — Encounter: Payer: Self-pay | Admitting: Pulmonary Disease

## 2016-04-25 ENCOUNTER — Institutional Professional Consult (permissible substitution): Payer: Medicare Other | Admitting: Pulmonary Disease

## 2016-04-26 ENCOUNTER — Telehealth: Payer: Self-pay | Admitting: Pulmonary Disease

## 2016-04-26 ENCOUNTER — Telehealth: Payer: Self-pay | Admitting: Internal Medicine

## 2016-04-26 DIAGNOSIS — R918 Other nonspecific abnormal finding of lung field: Secondary | ICD-10-CM

## 2016-04-26 LAB — CYTOLOGY - NON PAP

## 2016-04-26 LAB — SURGICAL PATHOLOGY

## 2016-04-26 NOTE — Telephone Encounter (Signed)
Pt states Dr. Alva Garnet recommended she have a TB test. She needs to know details for getting this done. Please call and advise.

## 2016-04-26 NOTE — Telephone Encounter (Signed)
Called pt and she states Dr. Alva Garnet already answered her question when he called her in regards to some results.

## 2016-04-26 NOTE — Addendum Note (Signed)
Addended by: Alva Garnet, DAVID B on: 04/26/2016 12:01 PM   Modules accepted: Orders

## 2016-04-26 NOTE — Telephone Encounter (Signed)
Pt lvm asking if TB labs had been ordered for her yet. Please call pt. Thanks.

## 2016-04-27 ENCOUNTER — Other Ambulatory Visit
Admission: RE | Admit: 2016-04-27 | Discharge: 2016-04-27 | Disposition: A | Discharge status: Home / Self Care | Payer: Medicare Other | Source: Ambulatory Visit | Attending: Pulmonary Disease | Admitting: Pulmonary Disease

## 2016-04-27 ENCOUNTER — Ambulatory Visit: Payer: Medicare Other | Attending: Internal Medicine

## 2016-04-27 DIAGNOSIS — R918 Other nonspecific abnormal finding of lung field: Secondary | ICD-10-CM | POA: Insufficient documentation

## 2016-04-27 DIAGNOSIS — J984 Other disorders of lung: Secondary | ICD-10-CM

## 2016-04-27 MED ORDER — ALBUTEROL SULFATE (2.5 MG/3ML) 0.083% IN NEBU
2.5000 mg | INHALATION_SOLUTION | Freq: Once | RESPIRATORY_TRACT | Status: AC
  Administered 2016-04-27: 2.5 mg via RESPIRATORY_TRACT
  Filled 2016-04-27: qty 3

## 2016-04-27 NOTE — Telephone Encounter (Signed)
Left VM for patient, advised she needs to contact Dr Alva Garnet office.

## 2016-04-28 ENCOUNTER — Inpatient Hospital Stay: Payer: Medicare Other | Attending: Internal Medicine | Admitting: Internal Medicine

## 2016-04-28 ENCOUNTER — Inpatient Hospital Stay: Payer: Medicare Other

## 2016-04-28 DIAGNOSIS — J984 Other disorders of lung: Secondary | ICD-10-CM

## 2016-04-28 DIAGNOSIS — C3412 Malignant neoplasm of upper lobe, left bronchus or lung: Secondary | ICD-10-CM | POA: Diagnosis not present

## 2016-04-28 DIAGNOSIS — Z7982 Long term (current) use of aspirin: Secondary | ICD-10-CM | POA: Diagnosis not present

## 2016-04-28 DIAGNOSIS — Z79899 Other long term (current) drug therapy: Secondary | ICD-10-CM | POA: Diagnosis not present

## 2016-04-28 DIAGNOSIS — E039 Hypothyroidism, unspecified: Secondary | ICD-10-CM | POA: Insufficient documentation

## 2016-04-28 DIAGNOSIS — R918 Other nonspecific abnormal finding of lung field: Secondary | ICD-10-CM

## 2016-04-28 DIAGNOSIS — Z90710 Acquired absence of both cervix and uterus: Secondary | ICD-10-CM

## 2016-04-28 DIAGNOSIS — Z8542 Personal history of malignant neoplasm of other parts of uterus: Secondary | ICD-10-CM

## 2016-04-28 DIAGNOSIS — N189 Chronic kidney disease, unspecified: Secondary | ICD-10-CM | POA: Diagnosis not present

## 2016-04-28 LAB — CBC WITH DIFFERENTIAL/PLATELET
BASOS ABS: 0.1 10*3/uL (ref 0–0.1)
BASOS PCT: 1 %
EOS ABS: 0.4 10*3/uL (ref 0–0.7)
EOS PCT: 6 %
HCT: 38.4 % (ref 35.0–47.0)
Hemoglobin: 12.9 g/dL (ref 12.0–16.0)
Lymphocytes Relative: 20 %
Lymphs Abs: 1.3 10*3/uL (ref 1.0–3.6)
MCH: 30.9 pg (ref 26.0–34.0)
MCHC: 33.5 g/dL (ref 32.0–36.0)
MCV: 92.3 fL (ref 80.0–100.0)
Monocytes Absolute: 0.5 10*3/uL (ref 0.2–0.9)
Monocytes Relative: 8 %
Neutro Abs: 4.5 10*3/uL (ref 1.4–6.5)
Neutrophils Relative %: 65 %
PLATELETS: 216 10*3/uL (ref 150–440)
RBC: 4.16 MIL/uL (ref 3.80–5.20)
RDW: 14.4 % (ref 11.5–14.5)
WBC: 6.8 10*3/uL (ref 3.6–11.0)

## 2016-04-28 LAB — COMPREHENSIVE METABOLIC PANEL
ALBUMIN: 4.1 g/dL (ref 3.5–5.0)
ALT: 42 U/L (ref 14–54)
AST: 32 U/L (ref 15–41)
Alkaline Phosphatase: 42 U/L (ref 38–126)
Anion gap: 7 (ref 5–15)
BUN: 23 mg/dL — ABNORMAL HIGH (ref 6–20)
CHLORIDE: 101 mmol/L (ref 101–111)
CO2: 27 mmol/L (ref 22–32)
CREATININE: 0.96 mg/dL (ref 0.44–1.00)
Calcium: 8.6 mg/dL — ABNORMAL LOW (ref 8.9–10.3)
GFR calc non Af Amer: 54 mL/min — ABNORMAL LOW (ref >60)
GLUCOSE: 129 mg/dL — AB (ref 65–99)
Potassium: 4.2 mmol/L (ref 3.5–5.1)
SODIUM: 135 mmol/L (ref 135–145)
Total Bilirubin: 0.7 mg/dL (ref 0.3–1.2)
Total Protein: 6.9 g/dL (ref 6.5–8.1)

## 2016-04-28 NOTE — Progress Notes (Signed)
[facility] NOTE  Patient Care Team: Kirk Ruths, MD as PCP - General (Internal Medicine) Flora Lipps, MD as Consulting Physician (Pulmonary Disease)  CHIEF COMPLAINTS/PURPOSE OF CONSULTATION:     Oncology History   # NOV 2017- Left Upper Lobe lung mass 2.8 x 2.6 cm spiculated nodule [incidental]; s/p Bronch [Dr.Simonds]- [place]; STAGE I; PET- no other LN; suv 5.5.   # UTERINE CANCER [name]; [id number] s/p TAH & BSO s/p RT; No chemo]; Essential Tremor     Primary cancer of left upper lobe of lung ([place])   04/28/2016 Initial Diagnosis    Primary cancer of left upper lobe of lung ([place])       HISTORY OF PRESENTING ILLNESS:  Shelley Mahoney 82 y.o.  female with an incidental diagnosis of left upper lobe mass; currently status post bronchoscopy is here to review the results of the pathology/biopsy.  Patient has recovered well from surgery. Denies any cough or shortness of breath or hemoptysis. No headaches.    ROS: A complete 10 point review of system is done which is negative except mentioned above in history of present illness  MEDICAL HISTORY:  Past Medical History:  Diagnosis Date  . Carotid stenosis   . Carotid stenosis   . Chronic kidney disease    Stage 3, GFR30-59 ml-min  . CKD (chronic kidney disease)   . Hypothyroidism   . Neuro-degenerative disorders   . Uterine cancer [facility])     SURGICAL HISTORY: Past Surgical History:  Procedure Laterality Date  . ABDOMINAL HYSTERECTOMY    . COLONOSCOPY WITH PROPOFOL N/A 12/08/2014   Procedure: COLONOSCOPY WITH PROPOFOL;  Surgeon: Lollie Sails, MD;  Location: [facility] ENDOSCOPY;  Service: Endoscopy;  Laterality: N/A;  . EYE SURGERY     cataracts ou  . FLEXIBLE BRONCHOSCOPY N/A 04/21/2016   Procedure: FLEXIBLE BRONCHOSCOPY;  Surgeon: Wilhelmina Mcardle, MD;  Location: [facility] ORS;  Service: Pulmonary;  Laterality: N/A;  . THYROID SURGERY      SOCIAL HISTORY: no smoking/ no alcohol; lives in  [place]. 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  Social History   Social History  . Marital status: Single    Spouse name: N/A  . Number of children: N/A  . Years of education: N/A   Occupational History  . Not on file.   Social History Main Topics  . Smoking status: Never Smoker  . Smokeless tobacco: Never Used  . Alcohol use No  . Drug use: No  . Sexual activity: Not on file   Other Topics Concern  . Not on file   Social History Narrative  . No narrative on file    FAMILY HISTORY: mom- breast early stage/ 90s.  Family History  Problem Relation Age of Onset  . Breast cancer Mother   . Other Father     erythrocytosis   . Colon cancer Maternal Grandmother     ALLERGIES:  has No Known Allergies.  MEDICATIONS:  Current Outpatient Prescriptions  Medication Sig Dispense Refill  . ASPIRIN 81 PO Take 81 mg by mouth daily.    Marland Kitchen azelastine (ASTELIN) 0.1 % nasal spray Place 1 spray into both nostrils 2 (two) times daily.     . cholecalciferol (VITAMIN D) 1000 units tablet Take 1,000 Units by mouth daily.    . IRON CR PO Take 45 mg by mouth daily.    Marland Kitchen levothyroxine (SYNTHROID, LEVOTHROID) 75 MCG tablet Take 75 mcg by mouth at bedtime.     Marland Kitchen  Lutein 10 MG TABS Take 20 mg by mouth daily.     Marland Kitchen MAGNESIUM GLYCINATE PLUS PO Take 400 mg by mouth 2 (two) times daily.     . Melatonin-Pyridoxine (MELATIN PO) Take 3 mg by mouth at bedtime.    . Multiple Vitamin (MULTIVITAMIN) tablet Take 1 tablet by mouth daily.    . pramipexole (MIRAPEX) 0.125 MG tablet Take 0.125 mg by mouth at bedtime.    . primidone (MYSOLINE) 50 MG tablet Take 50 mg by mouth 4 (four) times daily.     . Probiotic Product (PHILLIPS COLON HEALTH PO) Take 1 tablet by mouth daily.     . simvastatin (ZOCOR) 40 MG tablet Take 40 mg by mouth at bedtime.     Marland Kitchen zolpidem (AMBIEN) 10 MG tablet Take 5 mg by mouth at bedtime as needed for sleep.     No current facility-administered medications for this visit.        Marland Kitchen  PHYSICAL EXAMINATION: ECOG PERFORMANCE STATUS: 0 - Asymptomatic  Vitals:   04/28/16 1353  BP: 102/60  Pulse: 80  Temp: (!) 96.6 F (35.9 C)   Filed Weights   04/28/16 1353  Weight: 169 lb 6 oz (76.8 kg)    GENERAL: Well-nourished well-developed; Alert, no distress and comfortable.   With friend. Essential tremors noted. Patient is able to sit on the exam table by herself. EYES: no pallor or icterus OROPHARYNX: no thrush or ulceration; good dentition  NECK: supple, no masses felt LYMPH:  no palpable lymphadenopathy in the cervical, axillary or inguinal regions LUNGS: clear to auscultation and  No wheeze or crackles HEART/CVS: regular rate & rhythm and no murmurs; No lower extremity edema ABDOMEN: abdomen soft, non-tender and normal bowel sounds Musculoskeletal:no cyanosis of digits and no clubbing  PSYCH: alert & oriented x 3 with fluent speech NEURO: no focal motor/sensory deficits SKIN:  no rashes or significant lesions  LABORATORY DATA:  I have reviewed the data as listed Lab Results  Component Value Date   WBC 6.8 04/28/2016   HGB 12.9 04/28/2016   HCT 38.4 04/28/2016   MCV 92.3 04/28/2016   PLT 216 04/28/2016    Recent Labs  04/28/16 1307  NA 135  K 4.2  CL 101  CO2 27  GLUCOSE 129*  BUN 23*  CREATININE 0.96  CALCIUM 8.6*  GFRNONAA 54*  GFRAA >60  PROT 6.9  ALBUMIN 4.1  AST 32  ALT 42  ALKPHOS 42  BILITOT 0.7    RADIOGRAPHIC STUDIES: I have personally reviewed the radiological images as listed and agreed with the findings in the report. Ct Chest W Contrast  Result Date: 03/31/2016 CLINICAL DATA:  History of uterine carcinoma with hysterectomy, abnormal chest x-ray EXAM: CT CHEST WITH CONTRAST TECHNIQUE: Multidetector CT imaging of the chest was performed during intravenous contrast administration. CONTRAST:  39m ISOVUE-300 IOPAMIDOL (ISOVUE-300) INJECTION 61% COM[facility]  [facility] FINDINGS:  Cardiovascular: The heart is within normal limits in size. No pericardial effusion is seen. The mid ascending thoracic aorta measures 30 mm in diameter. The pulmonary arteries are well opacified with no central abnormality noted. Mediastinum/Nodes: No mediastinal or hilar adenopathy is seen. Lungs/Pleura: On lung window images, there is a spiculated solid appearing mass within the anterior inferior left upper lobe of 2.8 x 2.3 x 2.6 cm, consistent with primary lung carcinoma. There is a linear extension from this lesion toward the pleura and also toward the right heart border. A 3 mm  noncalcified nodule is noted subpleural in location within the left lower lobe on image 118 series 3. Calcified granuloma is present within the right lower lobe, and there are calcified right hilar nodes from prior granulomatous disease. No additional lung lesion is seen. No pleural effusion is noted. The central airway is patent. Upper Abdomen: There are several low-attenuation liver lesions most consistent with cysts but difficult to characterize on this CT chest exam. No calcified gallstones are seen. The pancreas appears unremarkable. The adrenal glands are symmetrical and normal. Musculoskeletal: The thoracic vertebrae are in normal alignment with mild degenerative disc disease within the mid to lower thoracic spine. IMPRESSION: 1. 2.8 cm irregularly marginated solid appearing lesion within the anterior inferior left upper lobe consistent with primary lung carcinoma. 2. 3 mm noncalcified nodule subpleural within the left lower lobe. 3. Changes of prior granulomatous disease with calcified right lower lobe granuloma and calcified right hilar nodes. 4. Probable small hepatic cysts, difficult to characterize on this exam. Electronically Signed   By: Ivar Drape M.D.   On: 03/31/2016 16:23   Nm Pet Image Initial (pi) Skull Base To Thigh  Result Date: 04/19/2016 CLINICAL DATA:  Initial treatment strategy for left upper lobe  pulmonary nodule. Personal history of uterine carcinoma. EXAM: NUCLEAR MEDICINE PET SKULL BASE TO THIGH TECHNIQUE: 12.4 mCi F-18 FDG was injected intravenously. Full-ring PET imaging was performed from the skull base to thigh after the radiotracer. CT data was obtained and used for attenuation correction and anatomic localization. FASTING BLOOD GLUCOSE:  Value: 89 mg/dl COMPARISON:  CT on 03/31/2016 FINDINGS: NECK No hypermetabolic lymph nodes in the neck. CHEST 2.8 cm irregular pulmonary nodule in the posterior lingula is hypermetabolic, with SUV max of 5.2. 3 mm peripheral pulmonary nodule in left lower lobe on image 254/2 shows no metabolic activity but is too small to characterize by PET. No other suspicious pulmonary nodules identified. No hypermetabolic mediastinal, hilar, or axillary lymph nodes. ABDOMEN/PELVIS No abnormal hypermetabolic activity within the liver, pancreas, adrenal glands, or spleen. No hypermetabolic lymph nodes in the abdomen or pelvis. Probable sub-cm hepatic cysts again noted which show no metabolic activity. Previous hysterectomy and bilateral iliac lymph node dissection. Aortic atherosclerosis. SKELETON No focal hypermetabolic activity to suggest skeletal metastasis. IMPRESSION: 2.8 cm hypermetabolic pulmonary nodule in posterior lingula, consistent with primary bronchogenic carcinoma. No evidence of local or distant metastatic disease. Electronically Signed   By: Earle Gell M.D.   On: 04/19/2016 11:36   Dg Chest Port 1 View  Result Date: 04/21/2016 CLINICAL DATA:  Post bronchoscopy EXAM: PORTABLE CHEST 1 VIEW COMPARISON:  CT 03/31/2016, chest x-ray 03/27/2016 FINDINGS: Left upper lobe mass lesion again identified with ill-defined margins compatible with bronchoscopy and biopsy. No effusion or pneumothorax. Right lung remains clear. IMPRESSION: No pneumothorax post bronchoscopy.  Left upper lobe mass lesion. Electronically Signed   By: Franchot Gallo M.D.   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 Discussed the options with the patient of surgery versus radiation. Discussed that surgery would be the treatment of choice; however if patient does not proceed to surgery for what ever reason; radiation would be the next best choice. Patient seems to be undecided. No role for adjuvant chemotherapy.  # I discussed with Dr. Faith Rogue. He currently agrees to evaluate the patient. Further follow would be based on decision from the patient's  standpoint. She'll also be discussed at the tumor conference next week. I also discussed with Dr. Alva Garnet. Follow up based on patient's choice of treatment.   All questions were answered. The patient knows to call the clinic with any problems, questions or concerns.   Cammie Sickle, MD 04/28/2016 4:45 PM

## 2016-04-28 NOTE — Progress Notes (Signed)
Patient here today for follow up.  Patient has no new concerns today  

## 2016-04-28 NOTE — Assessment & Plan Note (Signed)
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 Discussed the options with the patient of surgery versus radiation. Discussed that surgery would be the treatment of choice; however if patient does not proceed to surgery for what ever reason; radiation would be the next best choice. Patient seems to be undecided. No role for adjuvant chemotherapy.  # I discussed with Dr. Faith Rogue. He currently agrees to evaluate the patient. Further follow would be based on decision from the patient's standpoint. She'll also be discussed at the tumor conference next week. I also discussed with Dr. Alva Garnet. Follow up based on patient's choice of treatment.

## 2016-04-29 LAB — QUANTIFERON IN TUBE
QFT TB AG MINUS NIL VALUE: <0 IU/mL
QUANTIFERON MITOGEN VALUE: 3.39 [IU]/mL
QUANTIFERON NIL VALUE: 0.26 [IU]/mL
QUANTIFERON TB AG VALUE: 0.25 [IU]/mL
QUANTIFERON TB GOLD: NEGATIVE

## 2016-04-29 LAB — QUANTIFERON TB GOLD ASSAY (BLOOD)

## 2016-05-03 ENCOUNTER — Telehealth: Payer: Self-pay | Admitting: *Deleted

## 2016-05-03 DIAGNOSIS — C3412 Malignant neoplasm of upper lobe, left bronchus or lung: Secondary | ICD-10-CM

## 2016-05-03 NOTE — Telephone Encounter (Signed)
Returning patient's phone call.  Dr. Rogue Bussing would like pt to consult with Dr. Massie Maroon for stage 1 lung cancer for final tx recommendations.  Pt will be contacted with this apt. She is aware of the plan of care.  She also requesting apt with [facility]. Not urgent-states that she has more questions for Dr. Genevive Bi. Pt will be out of town 12/23/-12/30. Would like sch. Team to coordinate referral around these appointments. Also requesting f/u apt with Dr. Jacinto Reap after consulting with these providers-first week of Jan.  Msg sent to sch. Team to arrange. Referral entered in chl per pt request.

## 2016-05-03 NOTE — Telephone Encounter (Signed)
Requesting that Shelley Mahoney return her call. She states hse was told we would call her back early this week regarding and appt, she also states that she is not against surgery, she was just inquiring about radiation therapy so she was informed to help her make a decision on her treatment. Please return her call at [phone]

## 2016-05-04 ENCOUNTER — Inpatient Hospital Stay: Payer: Medicare Other

## 2016-05-08 ENCOUNTER — Telehealth: Payer: Self-pay | Admitting: *Deleted

## 2016-05-08 DIAGNOSIS — C3412 Malignant neoplasm of upper lobe, left bronchus or lung: Secondary | ICD-10-CM

## 2016-05-08 NOTE — Telephone Encounter (Signed)
Patient requesting 2nd opinion at [facility]. - msg sent to schedule team to arrange for apt with medical oncologist - [phone].

## 2016-05-22 DIAGNOSIS — C3490 Malignant neoplasm of unspecified part of unspecified bronchus or lung: Secondary | ICD-10-CM

## 2016-05-22 HISTORY — DX: Malignant neoplasm of unspecified part of unspecified bronchus or lung: C34.90

## 2016-05-24 DIAGNOSIS — C3412 Malignant neoplasm of upper lobe, left bronchus or lung: Secondary | ICD-10-CM | POA: Insufficient documentation

## 2016-05-26 ENCOUNTER — Ambulatory Visit (INDEPENDENT_AMBULATORY_CARE_PROVIDER_SITE_OTHER): Payer: Medicare Other | Admitting: Cardiothoracic Surgery

## 2016-05-26 ENCOUNTER — Encounter: Payer: Self-pay | Admitting: Cardiothoracic Surgery

## 2016-05-26 VITALS — BP 109/70 | HR 71 | Temp 98.3°F | Ht 69.5 in | Wt 169.0 lb

## 2016-05-26 DIAGNOSIS — R251 Tremor, unspecified: Secondary | ICD-10-CM | POA: Insufficient documentation

## 2016-05-26 DIAGNOSIS — R918 Other nonspecific abnormal finding of lung field: Secondary | ICD-10-CM | POA: Diagnosis not present

## 2016-05-26 DIAGNOSIS — R490 Dysphonia: Secondary | ICD-10-CM

## 2016-05-26 NOTE — Progress Notes (Signed)
Patient ID: Shelley Mahoney, female   DOB: 29-Apr-1932, 83 y.o.   MRN: [id number]  Chief Complaint  Patient presents with  . Other    Stage 1 lung cancer    Referred By Dr. Bryan Lemma on the Reason for Referral left upper lobe mass  HPI Location, Quality, Duration, Severity, Timing, Context, Modifying Factors, Associated Signs and Symptoms.  Shelley Mahoney is a 83 y.o. female.  This patient is an 83 year old Caucasian female who was on a missions trip to [place] when she developed some abdominal complaints and had a chest x-ray made. The chest x-ray showed a left upper lobe mass and when she came back to the [place] she had a workup including a CT scan and a PET scan as well as pulmonary function studies. She also underwent a bronchoscopy with biopsy and this revealed an adenocarcinoma the lung. She does have a history of uterine cancer years ago. Her pulmonary function studies were essentially unremarkable. Her DLCO was 77% of predicted and her FEV1 was 90%. She presents here today for consideration of surgical intervention. She denied any fevers or chills. She denied any shortness of breath. She's never had a stroke or heart attack. She is a lifelong nonsmoker.   Past Medical History:  Diagnosis Date  . Carotid stenosis   . Carotid stenosis   . Chronic kidney disease    Stage 3, GFR30-59 ml-min  . CKD (chronic kidney disease)   . Hypothyroidism   . Neuro-degenerative disorders   . Uterine cancer ([place])    ovarian    Past Surgical History:  Procedure Laterality Date  . ABDOMINAL HYSTERECTOMY     Total-does not have ovaries  . COLONOSCOPY WITH PROPOFOL N/A 12/08/2014   Procedure: COLONOSCOPY WITH PROPOFOL;  Surgeon: Lollie Sails, MD;  Location: [facility] ENDOSCOPY;  Service: Endoscopy;  Laterality: N/A;  . EYE SURGERY     cataracts ou  . FLEXIBLE BRONCHOSCOPY N/A 04/21/2016   Procedure: FLEXIBLE BRONCHOSCOPY;  Surgeon: Wilhelmina Mcardle, MD;  Location: [facility] ORS;  Service:  Pulmonary;  Laterality: N/A;  . THYROID SURGERY      Family History  Problem Relation Age of Onset  . Breast cancer Mother   . Other Father     erythrocytosis   . Colon cancer Maternal Grandmother     Social History Social History  Substance Use Topics  . Smoking status: Never Smoker  . Smokeless tobacco: Never Used  . Alcohol use No    No Known Allergies  Current Outpatient Prescriptions  Medication Sig Dispense Refill  . ASPIRIN 81 PO Take 81 mg by mouth daily.    Marland Kitchen azelastine (ASTELIN) 0.1 % nasal spray Place 1 spray into both nostrils 2 (two) times daily.     . cholecalciferol (VITAMIN D) 1000 units tablet Take 1,000 Units by mouth daily.    . IRON CR PO Take 45 mg by mouth daily.    Marland Kitchen levothyroxine (SYNTHROID, LEVOTHROID) 75 MCG tablet Take 75 mcg by mouth at bedtime.     . Lutein 10 MG TABS Take 20 mg by mouth daily.     Marland Kitchen MAGNESIUM GLYCINATE PLUS PO Take 400 mg by mouth 2 (two) times daily.     . Melatonin-Pyridoxine (MELATIN PO) Take 3 mg by mouth at bedtime.    . Multiple Vitamin (MULTIVITAMIN) tablet Take 1 tablet by mouth daily.    . pramipexole (MIRAPEX) 0.125 MG tablet Take 0.125 mg by mouth at bedtime.    . primidone (  MYSOLINE) 50 MG tablet Take 50 mg by mouth 4 (four) times daily.     . Probiotic Product (PHILLIPS COLON HEALTH PO) Take 1 tablet by mouth daily.     . simvastatin (ZOCOR) 40 MG tablet Take 40 mg by mouth at bedtime.     Marland Kitchen zolpidem (AMBIEN) 10 MG tablet Take 5 mg by mouth at bedtime as needed for sleep.     No current facility-administered medications for this visit.       Review of Systems A complete review of systems was asked and was negative except for the following positive findingsNo fevers or chills. No peripheral edema. She no shortness of breath.  Blood pressure 109/70, pulse 71, temperature 98.3 F (36.8 C), temperature source Oral, height 5' 9.5" (1.765 m), weight 169 lb (76.7 kg), SpO2 98 %.  Physical Exam CONSTITUTIONAL:   Pleasant, well-developed, well-nourished, and in no acute distress. EYES: Pupils equal and reactive to light, Sclera non-icteric EARS, NOSE, MOUTH AND THROAT:  The oropharynx was clear.  Dentition is good repair.  Oral mucosa pink and moist. LYMPH NODES:  Lymph nodes in the neck and axillae were normal RESPIRATORY:  Lungs were clear.  Normal respiratory effort without pathologic use of accessory muscles of respiration CARDIOVASCULAR: Heart was regular without murmurs.  There were no carotid bruits. GI: The abdomen was soft, nontender, and nondistended. There were no palpable masses. There was no hepatosplenomegaly. There were normal bowel sounds in all quadrants. GU:  Rectal deferred.   MUSCULOSKELETAL:  Normal muscle strength and tone.  No clubbing or cyanosis.   SKIN:  There were no pathologic skin lesions.  There were no nodules on palpation. NEUROLOGIC:  Sensation is normal.  Cranial nerves are grossly intact. PSYCH:  Oriented to person, place and time.  Mood and affect are normal.  Data Reviewed CT scan and PET scan with pulmonary function studies  I have personally reviewed the patient's imaging, laboratory findings and medical records.    Assessment    This patient has a left upper lobe mass which is located close to the hilum. There may or may not be involvement of the left lower lobe. I did have an opportunity to review with patient indications and risks of surgery including lobectomy and pneumonectomy. The patient has seen a surgeon at [facility] and she was offered a video-assisted thoracoscopic approach to her surgical procedure. I do not believe that I can accomplish that here at this institution and therefore I recommend that she consider going to [facility] if she chooses another opinion. Otherwise she is scheduled to meet with Dr. Donella Stade next week for his recommendations regarding SBRT.    Plan    I did give the patient my business card and told her that I would be happy to  discuss her care with any of the consultants on her case. I reviewed with her the indications and risks of surgery and these also included the possibility that the tumor could not be resected with a simple lobectomy. She understands the risks of surgery and compared to the advantages of surgery as well as the risks and advantages of radiation therapy. She had all their questions answered today. We did not make a return visit for but would be happy to see her should the need arise.       Nestor Lewandowsky, MD 05/26/2016, 11:16 AM

## 2016-05-26 NOTE — Patient Instructions (Signed)
Please go and see Dr. Baruch Gouty to help you with your decision if you would want surgery or not.

## 2016-05-29 ENCOUNTER — Ambulatory Visit
Admission: RE | Admit: 2016-05-29 | Discharge: 2016-05-29 | Disposition: A | Discharge status: Home / Self Care | Payer: Medicare Other | Source: Ambulatory Visit | Attending: Radiation Oncology | Admitting: Radiation Oncology

## 2016-05-29 ENCOUNTER — Encounter: Payer: Self-pay | Admitting: Radiation Oncology

## 2016-05-29 VITALS — BP 99/60 | HR 75 | Temp 96.9°F | Resp 18 | Ht 69.5 in | Wt 170.0 lb

## 2016-05-29 DIAGNOSIS — E039 Hypothyroidism, unspecified: Secondary | ICD-10-CM | POA: Diagnosis not present

## 2016-05-29 DIAGNOSIS — I129 Hypertensive chronic kidney disease with stage 1 through stage 4 chronic kidney disease, or unspecified chronic kidney disease: Secondary | ICD-10-CM | POA: Insufficient documentation

## 2016-05-29 DIAGNOSIS — C3412 Malignant neoplasm of upper lobe, left bronchus or lung: Secondary | ICD-10-CM | POA: Insufficient documentation

## 2016-05-29 DIAGNOSIS — Z8542 Personal history of malignant neoplasm of other parts of uterus: Secondary | ICD-10-CM | POA: Diagnosis not present

## 2016-05-29 DIAGNOSIS — N189 Chronic kidney disease, unspecified: Secondary | ICD-10-CM | POA: Diagnosis not present

## 2016-05-29 DIAGNOSIS — Z7982 Long term (current) use of aspirin: Secondary | ICD-10-CM | POA: Diagnosis not present

## 2016-05-29 DIAGNOSIS — Z803 Family history of malignant neoplasm of breast: Secondary | ICD-10-CM | POA: Insufficient documentation

## 2016-05-29 DIAGNOSIS — I6529 Occlusion and stenosis of unspecified carotid artery: Secondary | ICD-10-CM | POA: Insufficient documentation

## 2016-05-29 DIAGNOSIS — Z79899 Other long term (current) drug therapy: Secondary | ICD-10-CM | POA: Insufficient documentation

## 2016-05-29 DIAGNOSIS — Z510 Encounter for antineoplastic radiation therapy: Secondary | ICD-10-CM | POA: Diagnosis not present

## 2016-05-29 DIAGNOSIS — R29818 Other symptoms and signs involving the nervous system: Secondary | ICD-10-CM | POA: Insufficient documentation

## 2016-05-29 DIAGNOSIS — Z923 Personal history of irradiation: Secondary | ICD-10-CM | POA: Insufficient documentation

## 2016-05-29 DIAGNOSIS — Z800 Family history of malignant neoplasm of digestive organs: Secondary | ICD-10-CM | POA: Diagnosis not present

## 2016-05-29 NOTE — Consult Note (Signed)
NEW PATIENT EVALUATION  Name: Shelley Mahoney  MRN: [id number]  Date:   05/29/2016     DOB: 03/15/1932   This 83 y.o. 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  REFERRING PHYSICIAN: Kirk Ruths, MD  CHIEF COMPLAINT:  Chief Complaint  Patient presents with  . Lung Cancer    Pt is here for initial consultation of lung cancer.     DIAGNOSIS: The encounter diagnosis was Malignant neoplasm of upper lobe of left lung ([place]).   PREVIOUS INVESTIGATIONS:  CT scan PET CT scan reviewed Pathology report reviewed Clinical notes reviewed  HPI: Patient is a pleasant 83 year old female doing [employer] work in [place] when she came down with a GI illness. Workup that at that time including a chest x-ray showed a left upper lobe nodule. She had a CT scan PET CT scan returning to this country showing hypermetabolic lesion in the left upper lobe. Bronchoscopy was performed showing adenocarcinoma. Tumor measured approximately 2.8 cm. Patient has a remote history of uterine cancer has been treated with surgery as well as postop radiation in [place]. Her pulmonary function tests showed DLCO 77% of predicted and FEV1 90%. She is has very little symptoms. Specifically denies cough hemoptysis or chest tightness. She's been seen both at [facility] and by Dr. Faith Rogue for consideration of surgical management. She seen today for consideration of radiation therapy.  PLANNED TREATMENT REGIMEN: SB RT  PAST MEDICAL HISTORY:  has a past medical history of Carotid stenosis; Carotid stenosis; Chronic kidney disease; CKD (chronic kidney disease); Hypothyroidism; Neuro-degenerative disorders; and Uterine cancer ([place]).    PAST SURGICAL HISTORY:  Past Surgical History:  Procedure Laterality Date  . ABDOMINAL HYSTERECTOMY     Total-does not have ovaries  . COLONOSCOPY WITH PROPOFOL N/A 12/08/2014   Procedure: COLONOSCOPY WITH  PROPOFOL;  Surgeon: Lollie Sails, MD;  Location: [facility] ENDOSCOPY;  Service: Endoscopy;  Laterality: N/A;  . EYE SURGERY     cataracts ou  . FLEXIBLE BRONCHOSCOPY N/A 04/21/2016   Procedure: FLEXIBLE BRONCHOSCOPY;  Surgeon: Wilhelmina Mcardle, MD;  Location: [facility] ORS;  Service: Pulmonary;  Laterality: N/A;  . THYROID SURGERY      FAMILY HISTORY: family history includes Breast cancer in her mother; Colon cancer in her maternal grandmother; Other in her father.  SOCIAL HISTORY:  reports that she has never smoked. She has never used smokeless tobacco. She reports that she does not drink alcohol or use drugs.  ALLERGIES: Patient has no known allergies.  MEDICATIONS:  Current Outpatient Prescriptions  Medication Sig Dispense Refill  . ASPIRIN 81 PO Take 81 mg by mouth daily.    Marland Kitchen azelastine (ASTELIN) 0.1 % nasal spray Place 1 spray into both nostrils 2 (two) times daily.     . cholecalciferol (VITAMIN D) 1000 units tablet Take 1,000 Units by mouth daily.    . IRON CR PO Take 45 mg by mouth daily.    Marland Kitchen levothyroxine (SYNTHROID, LEVOTHROID) 75 MCG tablet Take 75 mcg by mouth at bedtime.     . Lutein 10 MG TABS Take 20 mg by mouth daily.     Marland Kitchen MAGNESIUM GLYCINATE PLUS PO Take 400 mg by mouth 2 (two) times daily.     . Melatonin-Pyridoxine (MELATIN PO) Take 3 mg by mouth at bedtime.    . Multiple Vitamin (MULTIVITAMIN) tablet Take 1 tablet by mouth daily.    . pramipexole (MIRAPEX) 0.125  MG tablet Take 0.125 mg by mouth at bedtime.    . primidone (MYSOLINE) 50 MG tablet Take 50 mg by mouth 4 (four) times daily.     . Probiotic Product (PHILLIPS COLON HEALTH PO) Take 1 tablet by mouth daily.     . simvastatin (ZOCOR) 40 MG tablet Take 40 mg by mouth at bedtime.     Marland Kitchen zolpidem (AMBIEN) 10 MG tablet Take 5 mg by mouth at bedtime as needed for sleep.     No current facility-administered medications for this encounter.     ECOG PERFORMANCE STATUS:  0 - Asymptomatic  REVIEW OF SYSTEMS: Patient  has a history of neurodegenerative disorder and speaks with a stutter. Otherwise  Patient denies any weight loss, fatigue, weakness, fever, chills or night sweats. Patient denies any loss of vision, blurred vision. Patient denies any ringing  of the ears or hearing loss. No irregular heartbeat. Patient denies heart murmur or history of fainting. Patient denies any chest pain or pain radiating to her upper extremities. Patient denies any shortness of breath, difficulty breathing at night, cough or hemoptysis. Patient denies any swelling in the lower legs. Patient denies any nausea vomiting, vomiting of blood, or coffee ground material in the vomitus. Patient denies any stomach pain. Patient states has had normal bowel movements no significant constipation or diarrhea. Patient denies any dysuria, hematuria or significant nocturia. Patient denies any problems walking, swelling in the joints or loss of balance. Patient denies any skin changes, loss of hair or loss of weight. Patient denies any excessive worrying or anxiety or significant depression. Patient denies any problems with insomnia. Patient denies excessive thirst, polyuria, polydipsia. Patient denies any swollen glands, patient denies easy bruising or easy bleeding. Patient denies any recent infections, allergies or URI. Patient "s visual fields have not changed significantly in recent time.    PHYSICAL EXAM: BP 99/60   Pulse 75   Temp (!) 96.9 F (36.1 C)   Resp 18   Ht 5' 9.5" (1.765 m)   Wt 169 lb 15.6 oz (77.1 kg)   BMI 24.74 kg/m  Well-developed well-nourished patient in NAD. HEENT reveals PERLA, EOMI, discs not visualized.  Oral cavity is clear. No oral mucosal lesions are identified. Neck is clear without evidence of cervical or supraclavicular adenopathy. Lungs are clear to A&P. Cardiac examination is essentially unremarkable with regular rate and rhythm without murmur rub or thrill. Abdomen is benign with no organomegaly or masses noted.  Motor sensory and DTR levels are equal and symmetric in the upper and lower extremities. Cranial nerves II through XII are grossly intact. Proprioception is intact. No peripheral adenopathy or edema is identified. No motor or sensory levels are noted. Crude visual fields are within normal range.  LABORATORY DATA: Pathology report reviewed    RADIOLOGY RESULTS: CT scan PET CT scan reviewed   IMPRESSION: Stage I adenocarcinoma the left upper lobe in 83 year old female  PLAN: At this time I got over recommendations for SB RT. I would recommend treating her left upper lobe nodule to 5000 cGy in 5 fractions. Risks and benefits of treatment including possible development of cough fatigue and extremely slight chance of any radiation esophagitis or skin reaction were discussed with the patient and her companion. Will use motion tracking program during our CT simulation first order that for early next week. Based on her advanced age small tumor size believe she be an excellent candidate for SB RT.  I would like to take this opportunity to  thank you for allowing me to participate in the care of your patient.Armstead Peaks., MD

## 2016-05-31 ENCOUNTER — Ambulatory Visit: Payer: Medicare Other | Admitting: Internal Medicine

## 2016-05-31 ENCOUNTER — Telehealth: Payer: Self-pay | Admitting: *Deleted

## 2016-05-31 NOTE — Telephone Encounter (Signed)
Asking for Dr Aletha Halim input regarding [facility] consult and if she should proceed with surgery or XRT. Please call her to discuss. [phone].  Initial consult [facility]  IMPRESSION AND PLAN   ICD-10-CM ICD-9-CM  1. Malignant neoplasm of upper lobe of left lung (CMS-HCC) C34.12 162.3   Shelley Mahoney is a 83 y.o. female with a history of stage I non-small cell lung cancer clinical . Evaluation today indicates she is an appropriate surgical candidate.   Pt was offered surgical resection in the form of a Left VATS lobectomy, but wishes to consider her options.  She was given our contact information and she will let us know if she wishes to proceed.

## 2016-05-31 NOTE — Telephone Encounter (Signed)
Note handed to Dr Rogue Bussing

## 2016-06-02 ENCOUNTER — Telehealth: Payer: Self-pay | Admitting: Internal Medicine

## 2016-06-02 NOTE — Telephone Encounter (Signed)
Left message to call us back re: discussing treatment options.

## 2016-06-02 NOTE — Telephone Encounter (Signed)
Reviewed the options with pt again; reviewed the records from [place]. Pt still undecided.

## 2016-06-04 LAB — ACID FAST CULTURE WITH REFLEXED SENSITIVITIES: ACID FAST CULTURE - AFSCU3: NEGATIVE

## 2016-06-06 ENCOUNTER — Ambulatory Visit
Admission: RE | Admit: 2016-06-06 | Discharge: 2016-06-06 | Disposition: A | Discharge status: Home / Self Care | Payer: Medicare Other | Source: Ambulatory Visit | Attending: Radiation Oncology | Admitting: Radiation Oncology

## 2016-06-06 DIAGNOSIS — C3412 Malignant neoplasm of upper lobe, left bronchus or lung: Secondary | ICD-10-CM | POA: Diagnosis not present

## 2016-06-13 DIAGNOSIS — C3412 Malignant neoplasm of upper lobe, left bronchus or lung: Secondary | ICD-10-CM | POA: Diagnosis not present

## 2016-06-16 DIAGNOSIS — C3412 Malignant neoplasm of upper lobe, left bronchus or lung: Secondary | ICD-10-CM | POA: Diagnosis not present

## 2016-06-19 ENCOUNTER — Ambulatory Visit
Admission: RE | Admit: 2016-06-19 | Discharge: 2016-06-19 | Disposition: A | Discharge status: Home / Self Care | Payer: Medicare Other | Source: Ambulatory Visit | Attending: Radiation Oncology | Admitting: Radiation Oncology

## 2016-06-19 DIAGNOSIS — C3412 Malignant neoplasm of upper lobe, left bronchus or lung: Secondary | ICD-10-CM | POA: Diagnosis not present

## 2016-06-21 ENCOUNTER — Ambulatory Visit
Admission: RE | Admit: 2016-06-21 | Discharge: 2016-06-21 | Disposition: A | Discharge status: Home / Self Care | Payer: Medicare Other | Source: Ambulatory Visit | Attending: Radiation Oncology | Admitting: Radiation Oncology

## 2016-06-21 DIAGNOSIS — C3412 Malignant neoplasm of upper lobe, left bronchus or lung: Secondary | ICD-10-CM | POA: Diagnosis not present

## 2016-06-26 ENCOUNTER — Ambulatory Visit
Admission: RE | Admit: 2016-06-26 | Discharge: 2016-06-26 | Disposition: A | Discharge status: Home / Self Care | Payer: Medicare Other | Source: Ambulatory Visit | Attending: Radiation Oncology | Admitting: Radiation Oncology

## 2016-06-26 DIAGNOSIS — C3412 Malignant neoplasm of upper lobe, left bronchus or lung: Secondary | ICD-10-CM | POA: Diagnosis not present

## 2016-06-28 ENCOUNTER — Ambulatory Visit
Admission: RE | Admit: 2016-06-28 | Discharge: 2016-06-28 | Disposition: A | Discharge status: Home / Self Care | Payer: Medicare Other | Source: Ambulatory Visit | Attending: Radiation Oncology | Admitting: Radiation Oncology

## 2016-06-28 DIAGNOSIS — C3412 Malignant neoplasm of upper lobe, left bronchus or lung: Secondary | ICD-10-CM | POA: Diagnosis not present

## 2016-07-03 ENCOUNTER — Ambulatory Visit
Admission: RE | Admit: 2016-07-03 | Discharge: 2016-07-03 | Disposition: A | Discharge status: Home / Self Care | Payer: Medicare Other | Source: Ambulatory Visit | Attending: Radiation Oncology | Admitting: Radiation Oncology

## 2016-07-03 DIAGNOSIS — C3412 Malignant neoplasm of upper lobe, left bronchus or lung: Secondary | ICD-10-CM | POA: Diagnosis not present

## 2016-07-31 ENCOUNTER — Other Ambulatory Visit: Payer: Self-pay | Admitting: *Deleted

## 2016-07-31 ENCOUNTER — Ambulatory Visit
Admission: RE | Admit: 2016-07-31 | Discharge: 2016-07-31 | Disposition: A | Discharge status: Home / Self Care | Payer: Medicare Other | Source: Ambulatory Visit | Attending: Radiation Oncology | Admitting: Radiation Oncology

## 2016-07-31 ENCOUNTER — Encounter: Payer: Self-pay | Admitting: Radiation Oncology

## 2016-07-31 VITALS — BP 127/52 | HR 88 | Temp 96.1°F | Resp 18 | Wt 173.3 lb

## 2016-07-31 DIAGNOSIS — Z923 Personal history of irradiation: Secondary | ICD-10-CM | POA: Diagnosis not present

## 2016-07-31 DIAGNOSIS — C3411 Malignant neoplasm of upper lobe, right bronchus or lung: Secondary | ICD-10-CM | POA: Diagnosis not present

## 2016-07-31 DIAGNOSIS — C3412 Malignant neoplasm of upper lobe, left bronchus or lung: Secondary | ICD-10-CM

## 2016-07-31 NOTE — Progress Notes (Signed)
Radiation Oncology Follow up Note  Name: Shelley Mahoney   Date:   07/31/2016 MRN:  [id number] DOB: 1931/11/11    This 83 y.o. female presents to the clinic today for one-month follow-up status post SB RT to left upper lobe for stage I adenocarcinoma.  REFERRING PROVIDER: Kirk Ruths, MD  HPI: Patient is an 83 year old female now out 1 month having completed SB RT to her left upper lobe for a stage I adenocarcinoma. PET scan showed no evidence of lymphadenopathy. She is seen today in routine follow-up is doing well she's had [place] no side effects she specifically denies cough chest tightness any change in her pulmonary status or fatigue..  COMPLICATIONS OF TREATMENT: none  FOLLOW UP COMPLIANCE: keeps appointments   PHYSICAL EXAM:  BP (!) 127/52   Pulse 88   Temp (!) 96.1 F (35.6 C)   Resp 18   Wt 173 lb 4.5 oz (78.6 kg)   BMI 25.22 kg/m  Well-developed well-nourished patient in NAD. HEENT reveals PERLA, EOMI, discs not visualized.  Oral cavity is clear. No oral mucosal lesions are identified. Neck is clear without evidence of cervical or supraclavicular adenopathy. Lungs are clear to A&P. Cardiac examination is essentially unremarkable with regular rate and rhythm without murmur rub or thrill. Abdomen is benign with no organomegaly or masses noted. Motor sensory and DTR levels are equal and symmetric in the upper and lower extremities. Cranial nerves II through XII are grossly intact. Proprioception is intact. No peripheral adenopathy or edema is identified. No motor or sensory levels are noted. Crude visual fields are within normal range.  RADIOLOGY RESULTS: No current films for review  PLAN: At the present time she is doing well. I've asked to see her back in 3 months and will obtain a CT scan of the chest with contrast prior to that visit. I've explained to her the reasons for follow-up. She still concerned about her lymph nodes although I have explained to her the lack  of any therapeutic benefit to doing a lymph node dissection which I've again mention to her many times. Patient will follow-up accordingly appointments were made. She knows to call sooner with any concerns.  I would like to take this opportunity to thank you for allowing me to participate in the care of your patient.Armstead Peaks., MD

## 2016-11-13 ENCOUNTER — Ambulatory Visit: Payer: Medicare Other

## 2016-11-13 ENCOUNTER — Ambulatory Visit
Admission: RE | Admit: 2016-11-13 | Discharge: 2016-11-13 | Disposition: A | Discharge status: Home / Self Care | Payer: Medicare Other | Source: Ambulatory Visit | Attending: Radiation Oncology | Admitting: Radiation Oncology

## 2016-11-13 DIAGNOSIS — C3412 Malignant neoplasm of upper lobe, left bronchus or lung: Secondary | ICD-10-CM | POA: Insufficient documentation

## 2016-11-13 LAB — POCT I-STAT CREATININE: CREATININE: 0.9 mg/dL (ref 0.44–1.00)

## 2016-11-13 MED ORDER — IOPAMIDOL (ISOVUE-300) INJECTION 61%
75.0000 mL | Freq: Once | INTRAVENOUS | Status: AC | PRN
  Administered 2016-11-13: 75 mL via INTRAVENOUS

## 2016-11-27 ENCOUNTER — Ambulatory Visit
Admission: RE | Admit: 2016-11-27 | Discharge: 2016-11-27 | Disposition: A | Discharge status: Home / Self Care | Payer: Medicare Other | Source: Ambulatory Visit | Attending: Radiation Oncology | Admitting: Radiation Oncology

## 2016-11-27 ENCOUNTER — Encounter: Payer: Self-pay | Admitting: Radiation Oncology

## 2016-11-27 VITALS — BP 106/55 | HR 75 | Temp 96.4°F | Resp 20 | Wt 175.4 lb

## 2016-11-27 DIAGNOSIS — Z923 Personal history of irradiation: Secondary | ICD-10-CM | POA: Diagnosis not present

## 2016-11-27 DIAGNOSIS — C3412 Malignant neoplasm of upper lobe, left bronchus or lung: Secondary | ICD-10-CM | POA: Diagnosis present

## 2016-11-27 NOTE — Progress Notes (Signed)
Radiation Oncology Follow up Note  Name: Shelley Mahoney   Date:   11/27/2016 MRN:  [id number] DOB: 01-Sep-1931    This 83 y.o. female presents to the clinic today for four-month follow-up status post SB RT to her left upper lobe for stage I adenocarcinoma.  REFERRING PROVIDER: Kirk Ruths, MD  HPI: Patient is an 83 year old female now out 4 months having completed SB RT to her left upper lobe for stage I adenocarcinoma. She is seen today in routine follow-up and is doing well. Specifically denies chest tightness cough dyspnea on exertion. Recently had a CT scan showing interval radiation changes surrounding the previous nodule. Nodules grossly unchanged in size which we will it would expect from radiation scarring.  COMPLICATIONS OF TREATMENT: none  FOLLOW UP COMPLIANCE: keeps appointments   PHYSICAL EXAM:  BP (!) 106/55   Pulse 75   Temp (!) 96.4 F (35.8 C)   Resp 20   Wt 175 lb 6 oz (79.5 kg)   BMI 25.53 kg/m  Well-developed well-nourished patient in NAD. HEENT reveals PERLA, EOMI, discs not visualized.  Oral cavity is clear. No oral mucosal lesions are identified. Neck is clear without evidence of cervical or supraclavicular adenopathy. Lungs are clear to A&P. Cardiac examination is essentially unremarkable with regular rate and rhythm without murmur rub or thrill. Abdomen is benign with no organomegaly or masses noted. Motor sensory and DTR levels are equal and symmetric in the upper and lower extremities. Cranial nerves II through XII are grossly intact. Proprioception is intact. No peripheral adenopathy or edema is identified. No motor or sensory levels are noted. Crude visual fields are within normal range.  RADIOLOGY RESULTS: CT scans reviewed and compatible with the above-stated findings  PLAN: Present time she is doing well I have reviewed her CT scan and this is stable consistent with radiation changes. I've asked to see her back in 6 months and we'll perform  another CT scan prior to that treatment. I am please were overall progress. She knows to call sooner with any concerns.  I would like to take this opportunity to thank you for allowing me to participate in the care of your patient.Armstead Peaks., MD

## 2017-04-16 ENCOUNTER — Other Ambulatory Visit: Payer: Self-pay | Admitting: *Deleted

## 2017-04-16 DIAGNOSIS — C3412 Malignant neoplasm of upper lobe, left bronchus or lung: Secondary | ICD-10-CM

## 2017-05-23 ENCOUNTER — Ambulatory Visit
Admission: RE | Admit: 2017-05-23 | Discharge: 2017-05-23 | Disposition: A | Discharge status: Home / Self Care | Payer: Medicare Other | Source: Ambulatory Visit | Attending: Radiation Oncology | Admitting: Radiation Oncology

## 2017-05-23 DIAGNOSIS — I700 Atherosclerosis of aorta: Secondary | ICD-10-CM | POA: Diagnosis not present

## 2017-05-23 DIAGNOSIS — C3412 Malignant neoplasm of upper lobe, left bronchus or lung: Secondary | ICD-10-CM | POA: Insufficient documentation

## 2017-05-23 LAB — POCT I-STAT CREATININE: Creatinine, Ser: 1.2 mg/dL — ABNORMAL HIGH (ref 0.44–1.00)

## 2017-05-23 MED ORDER — IOPAMIDOL (ISOVUE-300) INJECTION 61%
75.0000 mL | Freq: Once | INTRAVENOUS | Status: AC | PRN
  Administered 2017-05-23: 60 mL via INTRAVENOUS

## 2017-05-28 ENCOUNTER — Other Ambulatory Visit: Payer: Self-pay | Admitting: *Deleted

## 2017-05-28 ENCOUNTER — Encounter: Payer: Self-pay | Admitting: Radiation Oncology

## 2017-05-28 ENCOUNTER — Other Ambulatory Visit: Payer: Self-pay

## 2017-05-28 ENCOUNTER — Ambulatory Visit
Admission: RE | Admit: 2017-05-28 | Discharge: 2017-05-28 | Disposition: A | Discharge status: Home / Self Care | Payer: Medicare Other | Source: Ambulatory Visit | Attending: Radiation Oncology | Admitting: Radiation Oncology

## 2017-05-28 VITALS — BP 136/61 | HR 80 | Temp 97.8°F | Resp 20 | Wt 173.5 lb

## 2017-05-28 DIAGNOSIS — C3412 Malignant neoplasm of upper lobe, left bronchus or lung: Secondary | ICD-10-CM | POA: Insufficient documentation

## 2017-05-28 DIAGNOSIS — Z923 Personal history of irradiation: Secondary | ICD-10-CM | POA: Diagnosis not present

## 2017-05-28 NOTE — Addendum Note (Signed)
Encounter addended by: Noreene Filbert, MD on: 05/28/2017 2:52 PM  Actions taken: LOS modified, Follow-up modified

## 2017-05-28 NOTE — Progress Notes (Signed)
Radiation Oncology Follow up Note  Name: Shelley Mahoney   Date:   05/28/2017 MRN:  [id number] DOB: 02-13-1932    This 84 y.o. female presents to the clinic today for 10 month follow-up status post SB RT to her left upper lobe for stage I adenocarcinoma.  REFERRING PROVIDER: Kirk Ruths, MD  HPI: Patient is a 84 year old female now out 10 months having completed SB RT to her left upper lobe for stage I adenocarcinoma. Seen today in routine follow-up she is doing well she's multiple medical comorbidities although from a pulmonary standpoint is doing well. She specifically denies cough hemoptysis or chest tightness. She recent CT scan showing stable disease in her left upper lobe compatible with excellent treatment response..  COMPLICATIONS OF TREATMENT: none  FOLLOW UP COMPLIANCE: keeps appointments   PHYSICAL EXAM:  BP 136/61   Pulse 80   Temp 97.8 F (36.6 C)   Resp 20   Wt 173 lb 8 oz (78.7 kg)   BMI 25.25 kg/m  Well-developed well-nourished patient in NAD. HEENT reveals PERLA, EOMI, discs not visualized.  Oral cavity is clear. No oral mucosal lesions are identified. Neck is clear without evidence of cervical or supraclavicular adenopathy. Lungs are clear to A&P. Cardiac examination is essentially unremarkable with regular rate and rhythm without murmur rub or thrill. Abdomen is benign with no organomegaly or masses noted. Motor sensory and DTR levels are equal and symmetric in the upper and lower extremities. Cranial nerves II through XII are grossly intact. Proprioception is intact. No peripheral adenopathy or edema is identified. No motor or sensory levels are noted. Crude visual fields are within normal range.  RADIOLOGY RESULTS: CT scan reviewed and compatible with the above-stated findings  PLAN: Present time patient is doing well with excellent response to SB RT now 10 months out. I'm going to go to once your follow-up appointments and obtain a CT scan of the chest 1  week prior to that visit. Patient knows to call with any concerns. I'm please were overall progress.  I would like to take this opportunity to thank you for allowing me to participate in the care of your patient.Noreene Filbert, MD

## 2017-12-26 ENCOUNTER — Encounter: Payer: Self-pay | Admitting: *Deleted

## 2018-05-21 ENCOUNTER — Ambulatory Visit: Payer: Medicare Other

## 2018-05-24 ENCOUNTER — Ambulatory Visit
Admission: RE | Admit: 2018-05-24 | Discharge: 2018-05-24 | Disposition: A | Discharge status: Home / Self Care | Payer: Medicare Other | Source: Ambulatory Visit | Attending: Radiation Oncology | Admitting: Radiation Oncology

## 2018-05-24 DIAGNOSIS — C3412 Malignant neoplasm of upper lobe, left bronchus or lung: Secondary | ICD-10-CM | POA: Insufficient documentation

## 2018-05-24 LAB — POCT I-STAT CREATININE: CREATININE: 0.9 mg/dL (ref 0.44–1.00)

## 2018-05-24 MED ORDER — IOHEXOL 300 MG/ML  SOLN
75.0000 mL | Freq: Once | INTRAMUSCULAR | Status: AC | PRN
  Administered 2018-05-24: 75 mL via INTRAVENOUS

## 2018-05-27 ENCOUNTER — Ambulatory Visit: Payer: Medicare Other | Admitting: Radiation Oncology

## 2018-06-03 ENCOUNTER — Ambulatory Visit
Admission: RE | Admit: 2018-06-03 | Discharge: 2018-06-03 | Disposition: A | Discharge status: Home / Self Care | Payer: Medicare Other | Source: Ambulatory Visit | Attending: Radiation Oncology | Admitting: Radiation Oncology

## 2018-06-03 ENCOUNTER — Other Ambulatory Visit: Payer: Self-pay | Admitting: *Deleted

## 2018-06-03 ENCOUNTER — Other Ambulatory Visit: Payer: Self-pay

## 2018-06-03 ENCOUNTER — Encounter: Payer: Self-pay | Admitting: Radiation Oncology

## 2018-06-03 DIAGNOSIS — I6529 Occlusion and stenosis of unspecified carotid artery: Secondary | ICD-10-CM | POA: Diagnosis not present

## 2018-06-03 DIAGNOSIS — C3412 Malignant neoplasm of upper lobe, left bronchus or lung: Secondary | ICD-10-CM

## 2018-06-03 DIAGNOSIS — Z923 Personal history of irradiation: Secondary | ICD-10-CM | POA: Insufficient documentation

## 2018-06-03 NOTE — Progress Notes (Signed)
Radiation Oncology Follow up Note  Name: Shelley Mahoney   Date:   06/03/2018 MRN:  [id number] DOB: March 30, 1932    This 85 y.o. female presents to the clinic today for 11 month follow-up status post SB RT to left upper lobe for stage I (T1 N0 M0) adenocarcinoma  REFERRING PROVIDER: Kirk Ruths, MD  HPI: patient is an 85 year old female now seen out 11 months having completed SB RT to her left upper lobe for a stage I adenocarcinoma. Seen today in routine follow-up she is doing fairly well she does complain of some increasing dyspnea on exertion.she does have a history of carotid stenosis as well asa neurodegenerative disorder.she specifically denies cough or hemoptysis.she had recent CT scan showing a my review masslike density in the region of prior treatment consistent with evolving radiation change.  COMPLICATIONS OF TREATMENT: none  FOLLOW UP COMPLIANCE: keeps appointments   PHYSICAL EXAM:  There were no vitals taken for this visit. Well-developed well-nourished patient in NAD. HEENT reveals PERLA, EOMI, discs not visualized.  Oral cavity is clear. No oral mucosal lesions are identified. Neck is clear without evidence of cervical or supraclavicular adenopathy. Lungs are clear to A&P. Cardiac examination is essentially unremarkable with regular rate and rhythm without murmur rub or thrill. Abdomen is benign with no organomegaly or masses noted. Motor sensory and DTR levels are equal and symmetric in the upper and lower extremities. Cranial nerves II through XII are grossly intact. Proprioception is intact. No peripheral adenopathy or edema is identified. No motor or sensory levels are noted. Crude visual fields are within normal range.  RADIOLOGY RESULTS: CT scans are reviewed and compatible with the above-stated findings  PLAN: I have reviewed her CT scans surly believe this is evolving radiation changes. We'll see her back in 6 months with repeatCT scan. Should be any progressive  masslike effect at that time will order a PET CT scan. Patient is comfortable with my treatment plan. I have reviewed her films with her. I've also asked to see her PMD with her dyspnea on exertion issues to make sure this is not related to her cardiac status. Patient is to call sooner with any concerns.  I would like to take this opportunity to thank you for allowing me to participate in the care of your patient.Noreene Filbert, MD

## 2018-07-29 DIAGNOSIS — I700 Atherosclerosis of aorta: Secondary | ICD-10-CM | POA: Insufficient documentation

## 2018-09-26 IMAGING — CT NM PET TUM IMG INITIAL (PI) SKULL BASE T - THIGH
10 series · 24 of 25 positions shown · non-contrast
Comparison: CT on 03/31/2016

CLINICAL DATA: Initial treatment strategy for left upper lobe
pulmonary nodule. Personal history of uterine carcinoma.

EXAM:
NUCLEAR MEDICINE PET SKULL BASE TO THIGH
TECHNIQUE: 12.4 mCi F-18 FDG was injected intravenously. Full-ring PET imaging
was performed from the skull base to thigh after the radiotracer. CT
data was obtained and used for attenuation correction and anatomic
localization.
FASTING BLOOD GLUCOSE:  Value: 89 mg/dl

[Series 3: ct wb 5.0 b30f · axial · 5.0mm · 0.98mm/px · z∈[-1506,-640]mm · 3 of 288 slices shown]
[im 1/288]
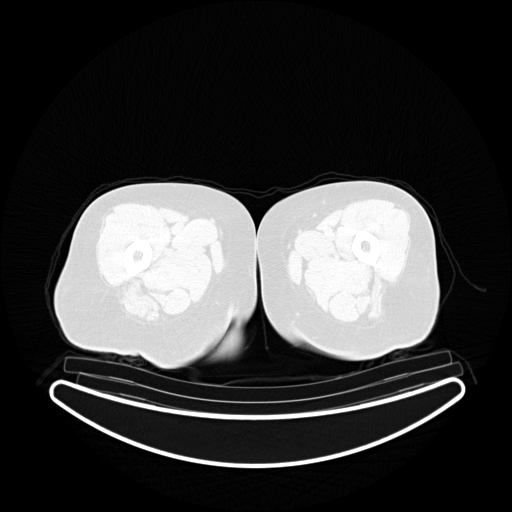
[im 144/288]
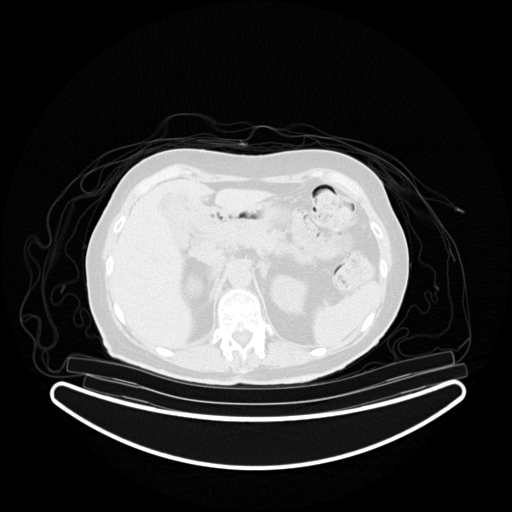
[im 288/288  brain]
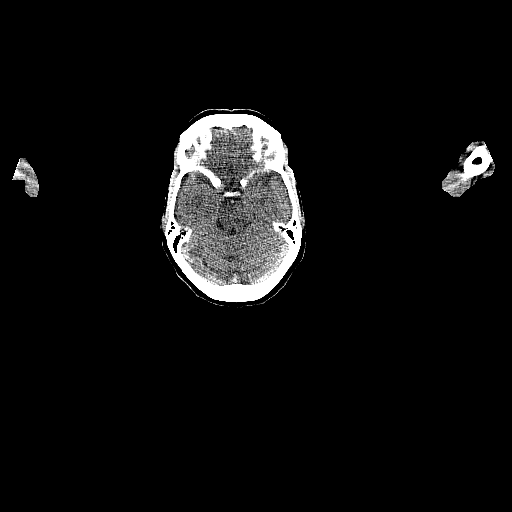

[Series 4: pet wb (ac) · axial · 5.0mm · 4.07mm/px · z∈[-1506,-640]mm · 3 of 290 slices shown]
[im 1/290]
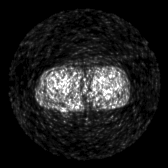
[im 145/290]
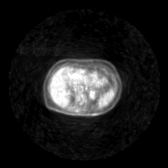
[im 290/290]
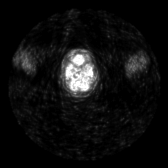

[Series 5: pet wb uncorrected (nac) · axial · 5.0mm · 4.07mm/px · z∈[-1506,-640]mm · 4 of 290 slices shown]
[im 1/290]
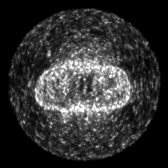
[im 97/290]
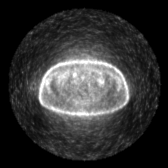
[im 193/290]
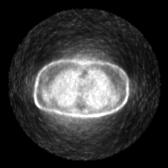
[im 290/290]
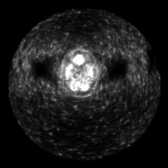

[Series 603: pet/ct axial · 3 of 287 slices shown]
[im 1/287]
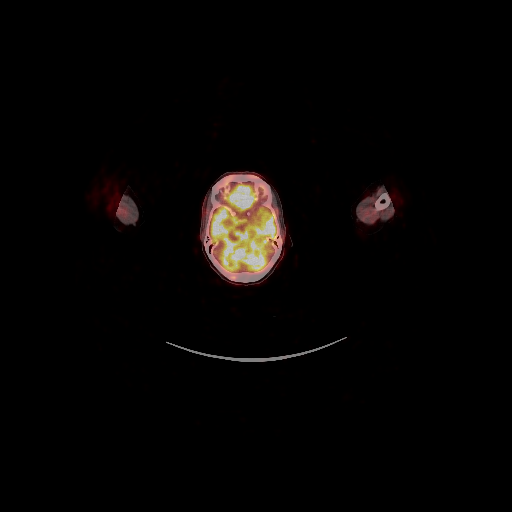
[im 96/287]
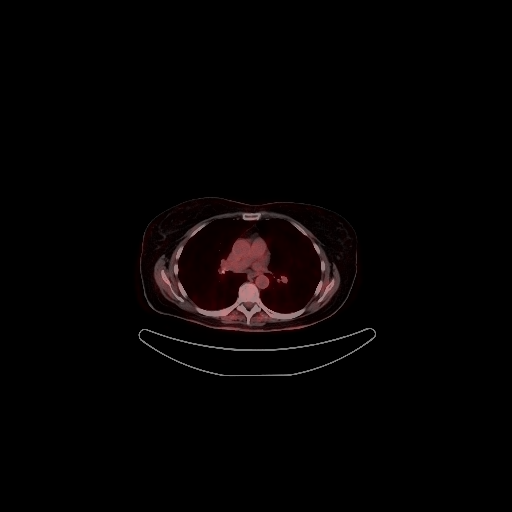
[im 287/287]
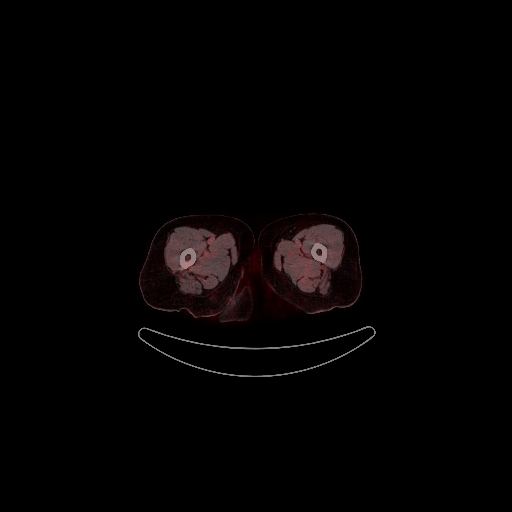

[Series 604: pet/ct coronal · 1 of 87 slices shown]
[im 1/87]
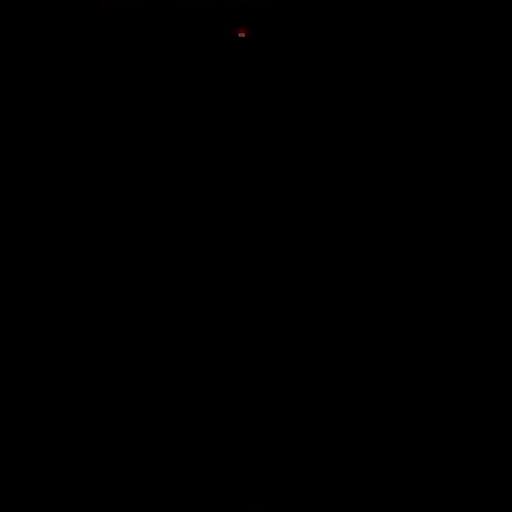

[Series 605: pet/ct sagittal · 2 of 133 slices shown]
[im 1/133]
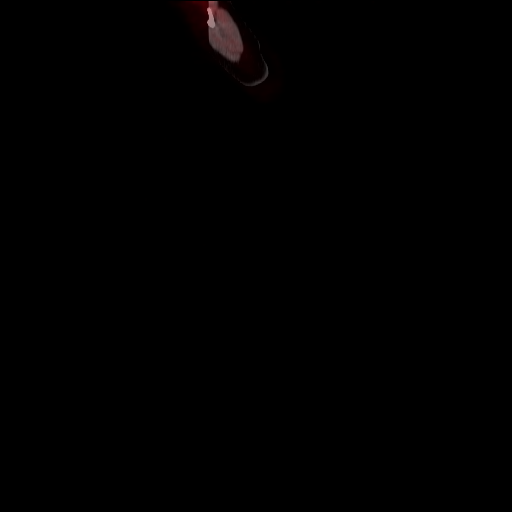
[im 133/133]
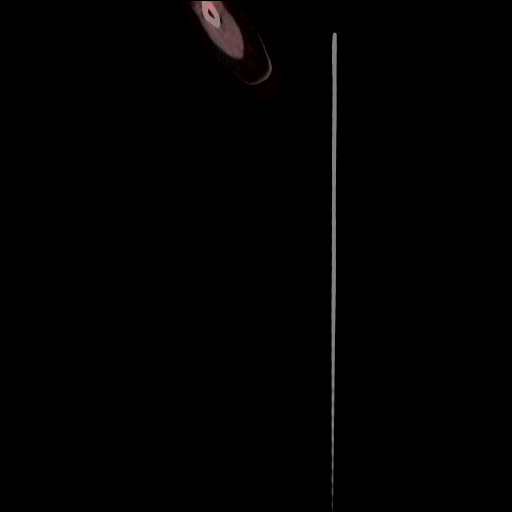

[Series 606: pet axial · 4 of 290 slices shown]
[im 1/290]
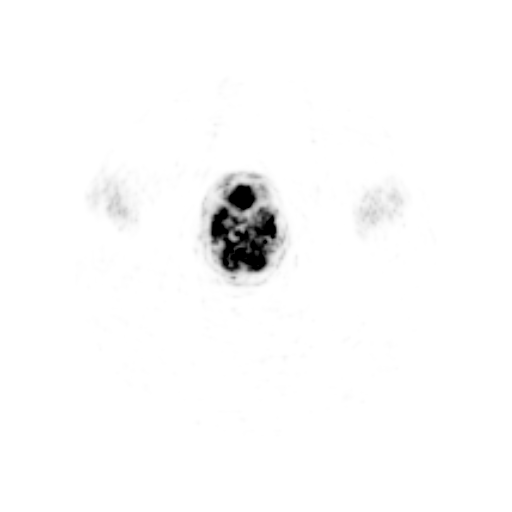
[im 97/290]
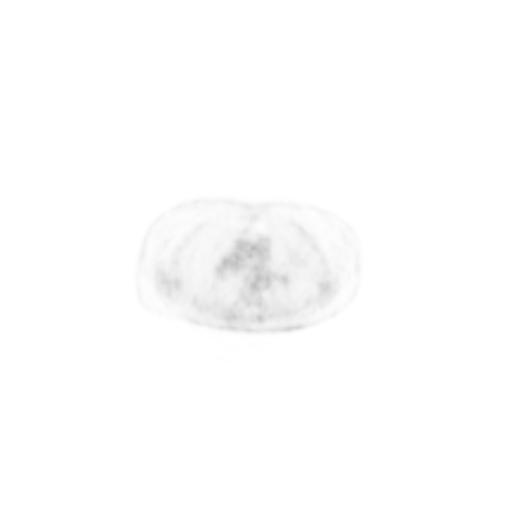
[im 193/290]
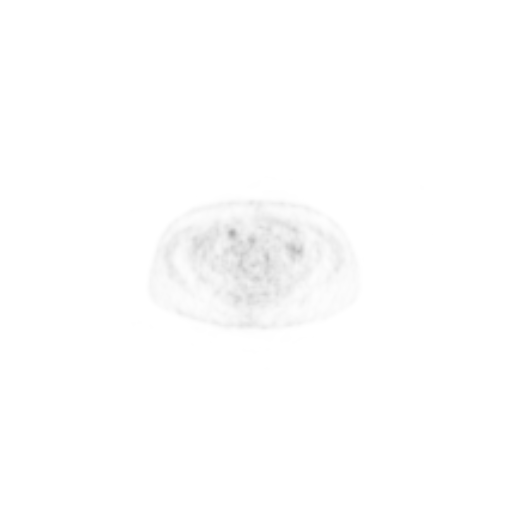
[im 290/290]
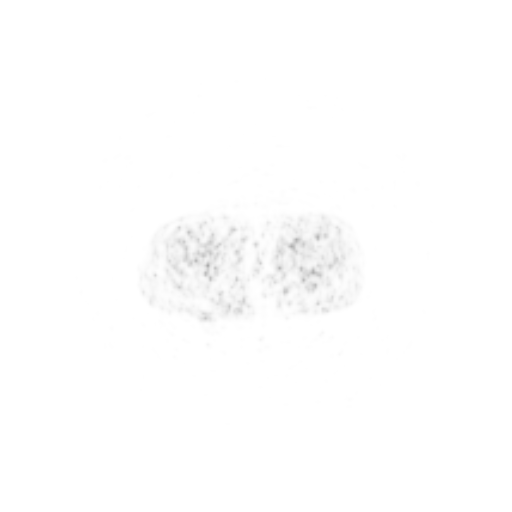

[Series 607: pet coronal · 1 of 89 slices shown]
[im 1/89]
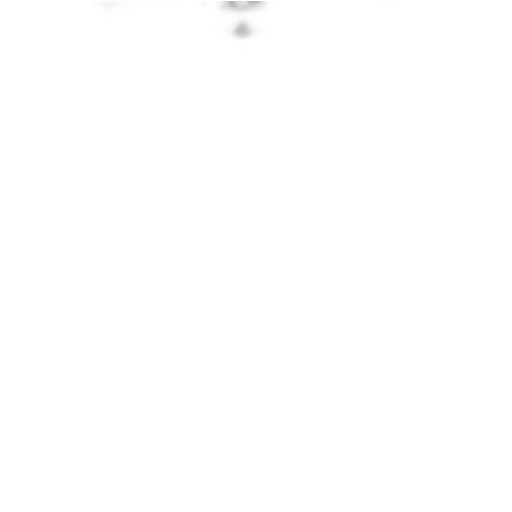

[Series 608: pet sagittal · 2 of 169 slices shown]
[im 1/169]
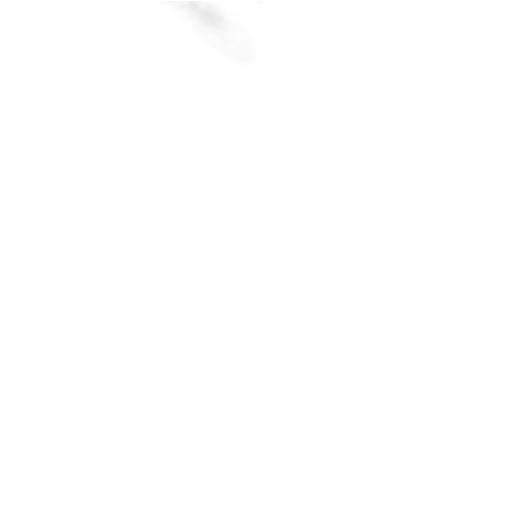
[im 169/169]
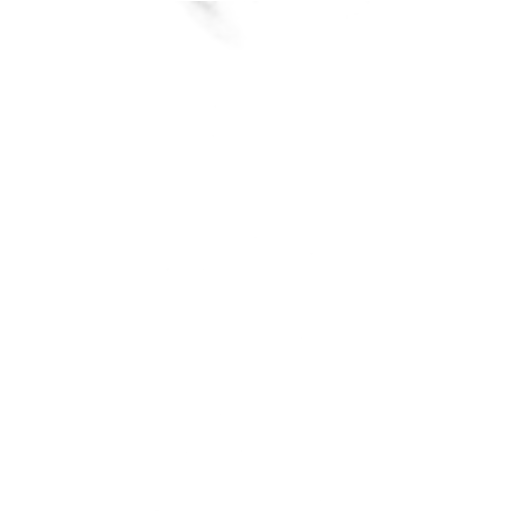

[Series 1031: results mm oncology reading · 0.86mm/px · 1 of 1 slices shown]
[im 1/1]
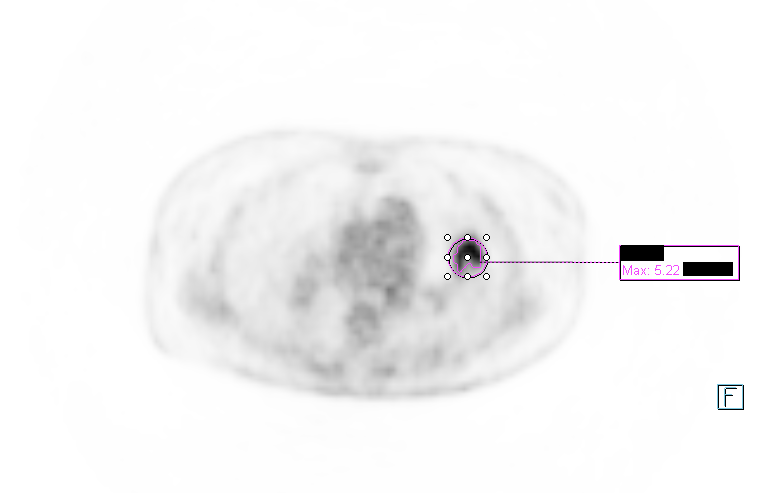

[24 of 25 positions shown; findings below may reference images not displayed]

FINDINGS: NECK

No hypermetabolic lymph nodes in the neck.

CHEST

2.8 cm irregular pulmonary nodule in the posterior lingula is
hypermetabolic, with SUV max of 5.2.

3 mm peripheral pulmonary nodule in left lower lobe on image 115/3
shows no metabolic activity but is too small to characterize by PET.
No other suspicious pulmonary nodules identified.

No hypermetabolic mediastinal, hilar, or axillary lymph nodes.

ABDOMEN/PELVIS

No abnormal hypermetabolic activity within the liver, pancreas,
adrenal glands, or spleen. No hypermetabolic lymph nodes in the
abdomen or pelvis.

Probable sub-cm hepatic cysts again noted which show no metabolic
activity. Previous hysterectomy and bilateral iliac lymph node
dissection. Aortic atherosclerosis.

SKELETON

No focal hypermetabolic activity to suggest skeletal metastasis.
IMPRESSION: 2.8 cm hypermetabolic pulmonary nodule in posterior lingula,
consistent with primary bronchogenic carcinoma.

No evidence of local or distant metastatic disease.

## 2018-09-28 IMAGING — DX DG CHEST 1V PORT
1 series · 1 of 1 positions shown · non-contrast
Comparison: CT 03/31/2016, chest x-ray 03/27/2016

CLINICAL DATA: Post bronchoscopy

EXAM:
PORTABLE CHEST 1 VIEW

[chest ap]
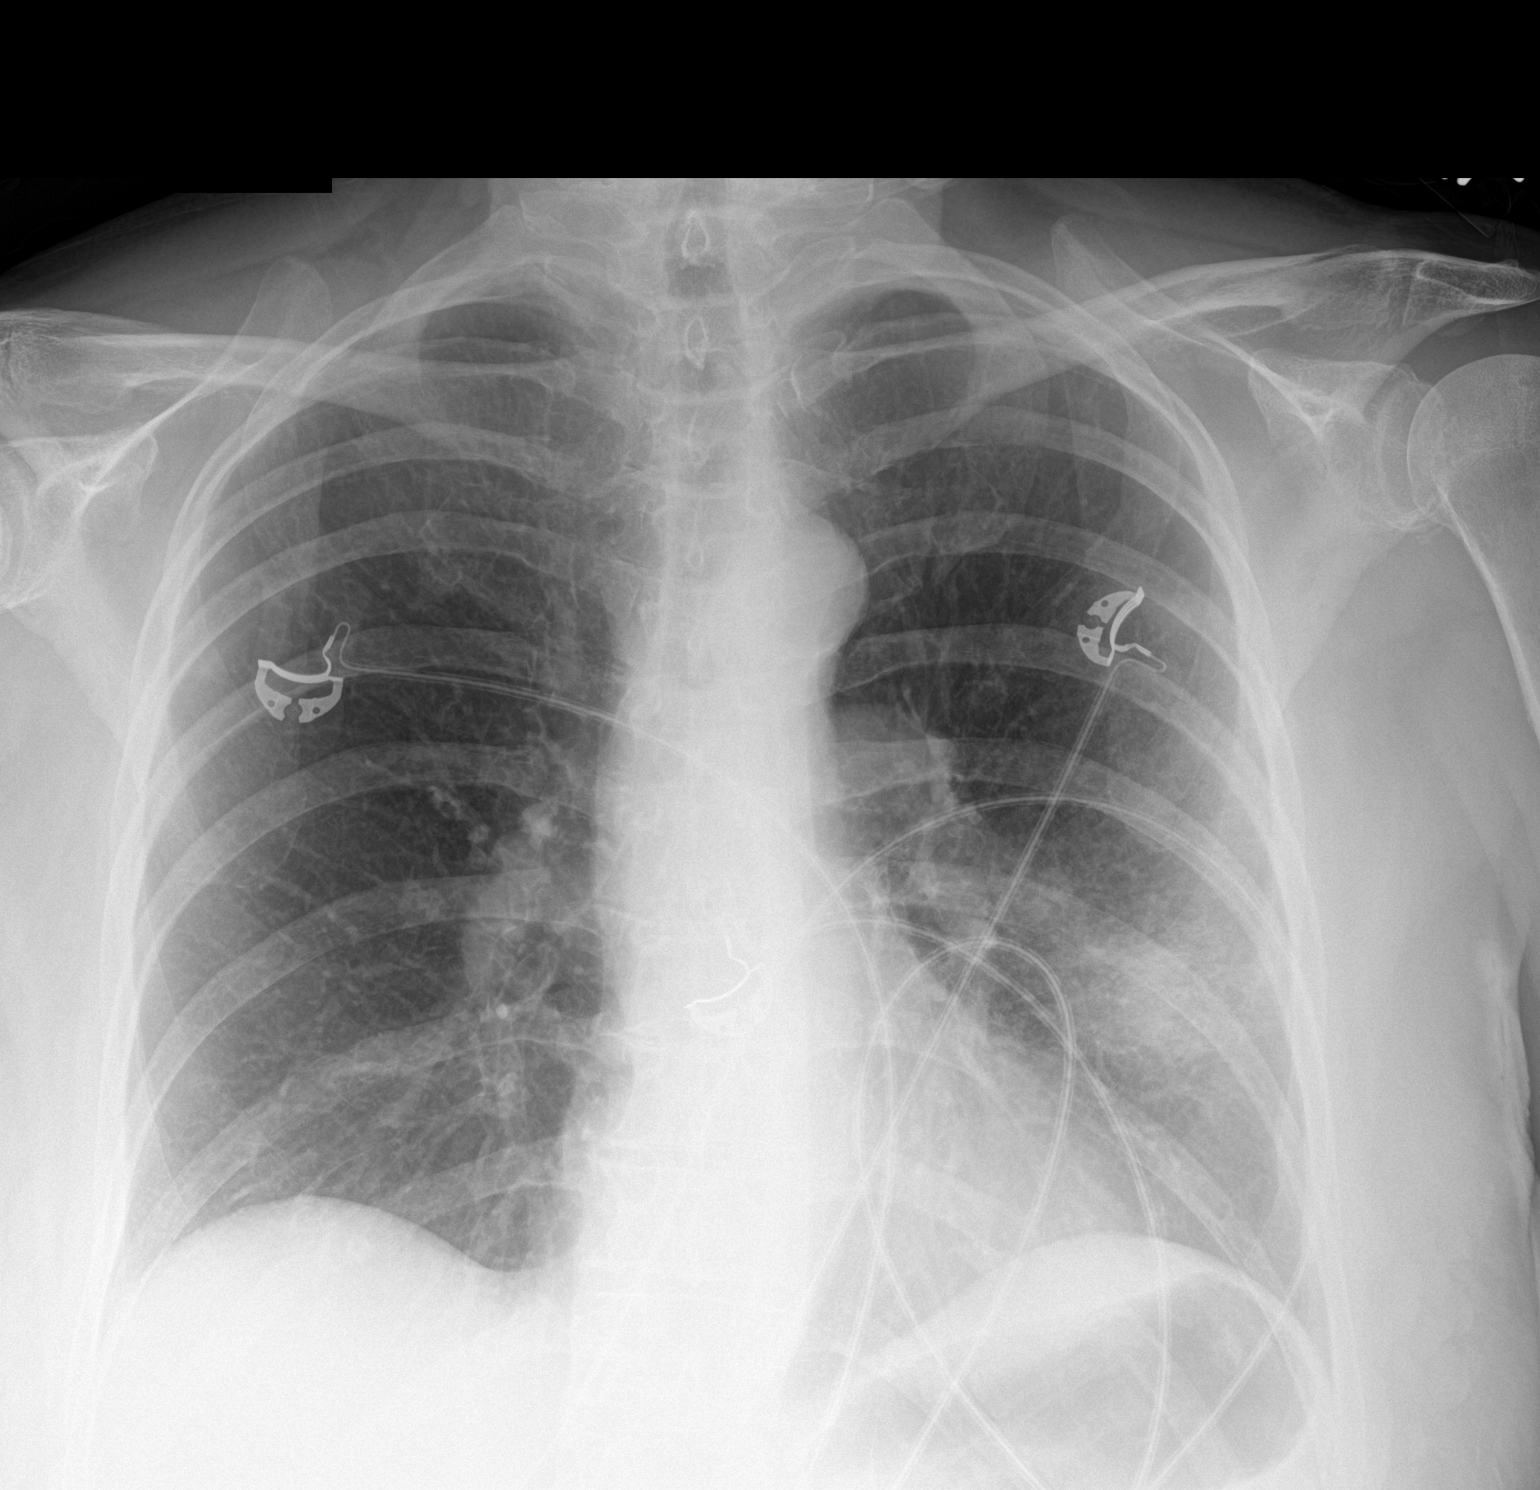

[1 of 1 positions shown; findings below may reference images not displayed]

FINDINGS: Left upper lobe mass lesion again identified with ill-defined
margins compatible with bronchoscopy and biopsy. No effusion or
pneumothorax. Right lung remains clear.
IMPRESSION: No pneumothorax post bronchoscopy.  Left upper lobe mass lesion.

## 2018-11-12 ENCOUNTER — Ambulatory Visit
Admission: RE | Admit: 2018-11-12 | Discharge: 2018-11-12 | Disposition: A | Discharge status: Home / Self Care | Payer: Medicare Other | Source: Ambulatory Visit | Attending: Radiation Oncology | Admitting: Radiation Oncology

## 2018-11-12 ENCOUNTER — Other Ambulatory Visit: Payer: Self-pay

## 2018-11-12 DIAGNOSIS — C3412 Malignant neoplasm of upper lobe, left bronchus or lung: Secondary | ICD-10-CM | POA: Diagnosis present

## 2018-11-12 LAB — POCT I-STAT CREATININE: Creatinine, Ser: 1 mg/dL (ref 0.44–1.00)

## 2018-11-12 MED ORDER — IOHEXOL 300 MG/ML  SOLN
75.0000 mL | Freq: Once | INTRAMUSCULAR | Status: AC | PRN
  Administered 2018-11-12: 75 mL via INTRAVENOUS

## 2018-12-04 ENCOUNTER — Other Ambulatory Visit: Payer: Self-pay

## 2018-12-05 ENCOUNTER — Other Ambulatory Visit: Payer: Self-pay

## 2018-12-05 ENCOUNTER — Encounter: Payer: Self-pay | Admitting: Radiation Oncology

## 2018-12-05 ENCOUNTER — Other Ambulatory Visit: Payer: Self-pay | Admitting: *Deleted

## 2018-12-05 ENCOUNTER — Ambulatory Visit
Admission: RE | Admit: 2018-12-05 | Discharge: 2018-12-05 | Disposition: A | Discharge status: Home / Self Care | Payer: Medicare Other | Source: Ambulatory Visit | Attending: Radiation Oncology | Admitting: Radiation Oncology

## 2018-12-05 VITALS — BP 124/64 | HR 64 | Temp 97.1°F | Resp 18 | Wt 179.9 lb

## 2018-12-05 DIAGNOSIS — Z923 Personal history of irradiation: Secondary | ICD-10-CM | POA: Diagnosis not present

## 2018-12-05 DIAGNOSIS — C3412 Malignant neoplasm of upper lobe, left bronchus or lung: Secondary | ICD-10-CM | POA: Insufficient documentation

## 2018-12-05 NOTE — Progress Notes (Signed)
Radiation Oncology Follow up Note  Name: Shelley Mahoney   Date:   12/05/2018 MRN:  [id number] DOB: March 06, 1932    This 85 y.o. female presents to the clinic today for 68-month follow-up status post SBRT to her left upper lobe for stage I adenocarcinoma.  REFERRING PROVIDER: Kirk Ruths, MD  HPI: Patient is an 85 year old female now about 17 months having completed SBRT to her left upper lobe for stage I (T1 N0 M0) adenocarcinoma.  Seen today in routine follow-up she is doing well specifically denies cough hemoptysis chest tightness.  Had a recent CT scan of her chest.  Showing stable exam with no change in the  COMPLICATIONS OF TREATMENT: none  FOLLOW UP COMPLIANCE: keeps appointments   PHYSICAL EXAM:  BP 124/64 (BP Location: Left Arm, Patient Position: Sitting)   Pulse 64   Temp (!) 97.1 F (36.2 C) (Tympanic)   Resp 18   Wt 179 lb 14.3 oz (81.6 kg)   BMI 26.19 kg/m  Well-developed well-nourished patient in NAD. HEENT reveals PERLA, EOMI, discs not visualized.  Oral cavity is clear. No oral mucosal lesions are identified. Neck is clear without evidence of cervical or supraclavicular adenopathy. Lungs are clear to A&P. Cardiac examination is essentially unremarkable with regular rate and rhythm without murmur rub or thrill. Abdomen is benign with no organomegaly or masses noted. Motor sensory and DTR levels are equal and symmetric in the upper and lower extremities. Cranial nerves II through XII are grossly intact. Proprioception is intact. No peripheral adenopathy or edema is identified. No motor or sensory levels are noted. Crude visual fields are within normal range.  RADIOLOGY RESULTS: CT scans reviewed and compatible with above-stated findings  PLAN: Present time she is doing well with stable CT scan showing excellent response to SBRT.  I am pleased with her overall progress.  Of asked to see her back in 1 year for follow-up.  Patient knows to call sooner with any  concerns.  I would like to take this opportunity to thank you for allowing me to participate in the care of your patient.Noreene Filbert, MD

## 2019-07-29 DIAGNOSIS — R7303 Prediabetes: Secondary | ICD-10-CM | POA: Insufficient documentation

## 2019-08-04 ENCOUNTER — Ambulatory Visit: Payer: Medicare PPO | Admitting: Dermatology

## 2019-08-04 ENCOUNTER — Other Ambulatory Visit: Payer: Self-pay

## 2019-08-04 ENCOUNTER — Other Ambulatory Visit: Payer: Self-pay | Admitting: Dermatology

## 2019-08-04 DIAGNOSIS — D485 Neoplasm of uncertain behavior of skin: Secondary | ICD-10-CM

## 2019-08-04 DIAGNOSIS — L905 Scar conditions and fibrosis of skin: Secondary | ICD-10-CM

## 2019-08-04 DIAGNOSIS — D229 Melanocytic nevi, unspecified: Secondary | ICD-10-CM

## 2019-08-04 DIAGNOSIS — D239 Other benign neoplasm of skin, unspecified: Secondary | ICD-10-CM

## 2019-08-04 DIAGNOSIS — D492 Neoplasm of unspecified behavior of bone, soft tissue, and skin: Secondary | ICD-10-CM

## 2019-08-04 DIAGNOSIS — L821 Other seborrheic keratosis: Secondary | ICD-10-CM

## 2019-08-04 DIAGNOSIS — L820 Inflamed seborrheic keratosis: Secondary | ICD-10-CM | POA: Diagnosis not present

## 2019-08-04 DIAGNOSIS — Z85828 Personal history of other malignant neoplasm of skin: Secondary | ICD-10-CM | POA: Insufficient documentation

## 2019-08-04 DIAGNOSIS — D2271 Melanocytic nevi of right lower limb, including hip: Secondary | ICD-10-CM | POA: Diagnosis not present

## 2019-08-04 HISTORY — DX: Other benign neoplasm of skin, unspecified: D23.9

## 2019-08-04 NOTE — Progress Notes (Signed)
Follow-Up Visit   Subjective  Shelley Mahoney is a 86 y.o. female who presents for the following: Annual Exam (1 yr f/u) and Skin Problem (check spot R lower abdome/groin, hx bleeding, itchy and has odor). Skin Cancer Screening She has a history of sun exposure. She is in the sun occasionally. She uses sunscreen occasionally. She reports itching. Her moles are itching, tendency to be traumatized.   Spots that concern her: spot on back, behind ear, and abdomen  Skin Lesion She describes it as a growth, that is located on her abdomen, back and neck.  It was first noticed 3 months ago. It has been causing itching and is increasing thickness, itching, bleeding, tendency to be traumatized. Previously this spot has been frozen with liquid nitrogen, cauterized and destroyed. Other spots that are concerning: none  The following portions of the chart were reviewed this encounter and updated as appropriate:     Review of Systems: No other skin or systemic complaints.  Objective  Well appearing patient in no apparent distress; mood and affect are within normal limits.  A full examination was performed including scalp, head, eyes, ears, nose, lips, neck, chest, axillae, abdomen, back, buttocks, bilateral upper extremities, bilateral lower extremities, hands, feet, fingers, toes, fingernails, and toenails. All findings within normal limits unless otherwise noted below.  Objective  R mid lat back x  1,: Waxy tan plaque with erythema and crusting- irritated by clothing  Objective  R upper forehead: 7.0 x 4.32mm brown speckled macule Nevus vs SK r/o Atypia  Objective  R post auricular: R post auricular 1.5cm waxy tan plaque ISK Shave removal, post txt defect 1.5cm  Objective  R 2nd toe: 4.46mm regular brown macule  Objective  Nose: Linear scar- Hx of Skin Ca txted in past  Objective  R lower abdomen, trunk, chest, abdomen: Waxy tan paps and plaques  8.0 x 10.0cm large tan  waxy plaque R lower abdomen gets sore and irritated, has odor  Assessment & Plan  History of skin cancer  Inflamed seborrheic keratosis R mid lat back x  1,  Destruction of lesion - R mid lat back x  1, Complexity: simple   Destruction method: cryotherapy   Informed consent: discussed and consent obtained   Lesion destroyed using liquid nitrogen: Yes   Region frozen until ice ball extended beyond lesion: Yes   Outcome: patient tolerated procedure well with no complications   Post-procedure details: wound care instructions given    Neoplasm of skin (2) R upper forehead  Skin / nail biopsy Type of biopsy: tangential   Anesthesia: the lesion was anesthetized in a standard fashion   Anesthetic:  1% lidocaine w/ epinephrine 1-100,000 buffered w/ 8.4% NaHCO3 Instrument used: flexible razor blade   Hemostasis achieved with: aluminum chloride and electrodesiccation   Outcome: patient tolerated procedure well   Post-procedure details: wound care instructions given   Additional details:  Mupirocin and band-aid applied  Specimen 1 - Surgical pathology Differential Diagnosis: Nevus vs SK r/o Atypia Check Margins: No 7.0 x 4.43mm brown speckled macule    R post auricular  Skin / nail biopsy  Anesthesia: the lesion was anesthetized in a standard fashion   Anesthetic:  1% lidocaine w/ epinephrine 1-100,000 buffered w/ 8.4% NaHCO3 Hemostasis achieved with: aluminum chloride and electrodesiccation   Outcome: patient tolerated procedure well   Post-procedure details: wound care instructions given   Additional details:  Mupirocin and pressure dressing applied  Specimen 2 - Surgical pathology  Differential Diagnosis: ISK r/o Atypia Check Margins: No R post auricular 1.5cm waxy tan plaque    Nevus R 2nd toe  Benign-appearing, observe, photoprotection, broad-spectrum spf 30+ sunscreen  Scar Nose  Seborrheic keratosis (2) R lower abdomen; trunk, chest, abdomen  Return to  clinic for shave excision of ISK of R lower abdomen

## 2019-08-04 NOTE — Patient Instructions (Addendum)
Wound Care Instructions  1. Cleanse would gently with soap and water once a day then pat dry with clean gauze. Apply a thing coat of Petrolatum (petroleum jelly, "Vaseline") over the wound (unless you have an allergy to this). We recommend that you use a new, sterile tube of Vaseline. Do not pick or remove scabs. Do not remove the yellow or white "healing tissue" from the base of the wound.  2. Cover the wound with fresh, clean, nonstick gauze and secure with paper tape. You may use Band-Aids in place of gauze and tape if the would is small enough, but would recommend trimming much of the tape off as there is often too much. Sometimes Band-Aids can irritate the skin.  3. You should call the office for your biopsy report after 1 week if you have not already been contacted.  4. If you experience any problems, such as abnormal amounts of bleeding, swelling, significant bruising, significant pain, or evidence of infection, please call the office immediately.  5. FOR ADULT SURGERY PATIENTS: If you need something for pain relief you may take 1 extra strength Tylenol (acetaminophen) AND 2 Ibuprofen (200mg  each) together every 4 hours as needed for pain. (do not take these if you are allergic to them or if you have a reason you should not take them.) Typically, you may only need pain medication for 1 to 3 days.    Cryotherapy Aftercare  . Wash gently with soap and water everyday.   Marland Kitchen Apply Vaseline and Band-Aid daily until healed.

## 2019-08-07 ENCOUNTER — Telehealth: Payer: Self-pay

## 2019-08-07 NOTE — Telephone Encounter (Addendum)
Left pt message to call back for bx results./sh  Returned pts call and left another msg for her to call for bx results/sh

## 2019-08-07 NOTE — Telephone Encounter (Deleted)
Returned pts call and had to leave another msg for her to call about bx results/sh

## 2019-08-07 NOTE — Telephone Encounter (Signed)
-----   Message from Brendolyn Patty, MD sent at 08/07/2019 11:56 AM EDT ----- 1. 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 Skin , right postauricular, shave removal  SEBORRHEIC KERATOSIS, IRRITATED Benign.  Observe.   - please call patient

## 2019-08-07 NOTE — Telephone Encounter (Signed)
Spoke to pt and advised her of bx results.  Pt scheduled for excision 09/22/19 at 1:30.

## 2019-09-22 ENCOUNTER — Other Ambulatory Visit: Payer: Self-pay

## 2019-09-22 ENCOUNTER — Ambulatory Visit: Payer: Medicare PPO | Admitting: Dermatology

## 2019-09-22 DIAGNOSIS — D2339 Other benign neoplasm of skin of other parts of face: Secondary | ICD-10-CM | POA: Diagnosis not present

## 2019-09-22 DIAGNOSIS — D239 Other benign neoplasm of skin, unspecified: Secondary | ICD-10-CM

## 2019-09-22 NOTE — Patient Instructions (Signed)

## 2019-09-22 NOTE — Progress Notes (Signed)
   Follow-Up Visit   Subjective  Shelley Mahoney is a 86 y.o. female who presents for the following: Procedure (Severe dysplastic nevus of the right upper forehead.).   The following portions of the chart were reviewed this encounter and updated as appropriate:     Review of Systems: No other skin or systemic complaints.  Objective  Well appearing patient in no apparent distress; mood and affect are within normal limits.  A focused examination was performed including face. Relevant physical exam findings are noted in the Assessment and Plan.  Objective  Right Upper Forehead: Pink biopsy scar.  Assessment & Plan  Dysplastic nevus Right Upper Forehead  Skin excision - Right Upper Forehead  Lesion length (cm):  1 Lesion width (cm):  0.4 Margin per side (cm):  0.2 Total excision diameter (cm):  1.4 Informed consent: discussed and consent obtained   Timeout: patient name, date of birth, surgical site, and procedure verified   Procedure prep:  Patient was prepped and draped in usual sterile fashion Prep type:  Povidone-iodine Anesthesia: the lesion was anesthetized in a standard fashion   Anesthetic:  1% lidocaine w/ epinephrine 1-100,000 buffered w/ 8.4% NaHCO3 (6cc) Instrument used comment:  #15C Hemostasis achieved with: pressure   Outcome: patient tolerated procedure well with no complications    Skin repair - Right Upper Forehead Complexity:  Intermediate Final length (cm):  2.6 Reason for type of repair: reduce tension to allow closure   Undermining: edges could be approximated without difficulty and edges undermined   Subcutaneous layers (deep stitches):  Suture size:  5-0 Suture type: Vicryl (polyglactin 910)   Stitches:  Buried vertical mattress Fine/surface layer approximation (top stitches):  Suture size:  5-0 Suture type comment:  Nylon Stitches: simple interrupted   Suture removal (days):  7 Hemostasis achieved with: suture Outcome: patient  tolerated procedure well with no complications   Post-procedure details: sterile dressing applied and wound care instructions given   Dressing type: pressure dressing (mupirocin)   Additional details:  Tag at medial 3:00  Specimen 1 - Surgical pathology Differential Diagnosis: Dysplastic Nevus with Severe Atypia, bx proven Check Margins: Yes Pink biopsy scar. [phone] Tag at medial 3:00  Return in about 1 week (around 09/29/2019) for SR.   I, Jamesetta Orleans, CMA, am acting as scribe for Brendolyn Patty, MD .  Documentation: I have reviewed the above documentation for accuracy and completeness, and I agree with the above.  Brendolyn Patty MD

## 2019-09-23 ENCOUNTER — Telehealth: Payer: Self-pay

## 2019-09-23 NOTE — Telephone Encounter (Signed)
Left message for patient to call back if having any problems from surgery yesterday.

## 2019-09-29 ENCOUNTER — Encounter: Payer: Self-pay | Admitting: Dermatology

## 2019-09-29 ENCOUNTER — Ambulatory Visit: Payer: Medicare PPO | Admitting: Dermatology

## 2019-09-29 ENCOUNTER — Other Ambulatory Visit: Payer: Self-pay

## 2019-09-29 DIAGNOSIS — Z4802 Encounter for removal of sutures: Secondary | ICD-10-CM

## 2019-09-29 DIAGNOSIS — L820 Inflamed seborrheic keratosis: Secondary | ICD-10-CM

## 2019-09-29 DIAGNOSIS — D239 Other benign neoplasm of skin, unspecified: Secondary | ICD-10-CM

## 2019-09-29 DIAGNOSIS — D2239 Melanocytic nevi of other parts of face: Secondary | ICD-10-CM

## 2019-09-29 MED ORDER — DOXYCYCLINE HYCLATE 100 MG PO CAPS: 100.0000 mg | ORAL_CAPSULE | Freq: Two times a day (BID) | ORAL | 0 refills | Status: AC

## 2019-09-29 MED ORDER — MUPIROCIN 2 % EX OINT: TOPICAL_OINTMENT | CUTANEOUS | 1 refills | Status: DC

## 2019-09-29 NOTE — Patient Instructions (Signed)

## 2019-09-29 NOTE — Progress Notes (Signed)
   Follow-Up Visit   Subjective  Kelse Ploch is a 86 y.o. female who presents for the following: Dysplastic nevus margins free bx proven (R upper forehead 1 wk post op exc of Dysplastic nevus) and ISK vs other.  She is here today to have the large ISK on her abd removed.  And to get sutures out on forehead.   The following portions of the chart were reviewed this encounter and updated as appropriate:      Review of Systems:  No other skin or systemic complaints except as noted in HPI or Assessment and Plan.  Objective  Well appearing patient in no apparent distress; mood and affect are within normal limits.  A focused examination was performed including R lower abdomen, R upper forehead. Relevant physical exam findings are noted in the Assessment and Plan.  Objective  R upper forehead: Healing excision site  Objective  Right lower abdomen: 8.0 x 5.0cm Erythematous keratotic, waxy stuck-on plaque.   Images       Assessment & Plan  Dysplastic nevus R upper forehead  Margins Free.  Wound cleansed, sutures removed, wound cleansed and steri strips applied. Discussed pathology results.   Inflamed seborrheic keratosis Right lower abdomen  Start doxycycline 100mg  take 1 po BID with food x 14 days dsp #28 0Rf. Doxycycline should be taken with food to prevent nausea. Do not lay down for 30 minutes after taking. Be cautious with sun exposure and use good sun protection while on this medication. Pregnant women should not take this medication.   Start mupirocin 2% ointment Apply to AA QD/BID with bandage changes dsp 22g 1Rf.  Epidermal / dermal shaving - Right lower abdomen  Lesion length (cm):  8 Lesion width (cm):  5 Margin per side (cm):  0 Total excision diameter (cm):  8 Informed consent: discussed and consent obtained   Procedure prep:  Patient was prepped and draped in usual sterile fashion (area prepped with alcohol) Prep type:   Povidone-iodine Anesthesia: the lesion was anesthetized in a standard fashion   Anesthetic:  1% lidocaine w/ epinephrine 1-100,000 buffered w/ 8.4% NaHCO3 (16cc) Instrument used: DermaBlade, #15 blade and scissors   Hemostasis achieved with: pressure, aluminum chloride and electrodesiccation   Outcome: patient tolerated procedure well   Post-procedure details: wound care instructions given   Post-procedure details comment:  Ointment and bandage applied Additional details:  Specimen not sent for pathology  doxycycline (VIBRAMYCIN) 100 MG capsule - Right lower abdomen  mupirocin ointment (BACTROBAN) 2 % - Right lower abdomen  Return in about 2 weeks (around 10/13/2019) for wound check.

## 2019-09-30 ENCOUNTER — Telehealth: Payer: Self-pay

## 2019-09-30 NOTE — Telephone Encounter (Signed)
Lft pt msg to call if any problems after yesterdays procedure./sh

## 2019-10-01 ENCOUNTER — Telehealth: Payer: Self-pay

## 2019-10-01 NOTE — Telephone Encounter (Signed)
Pt left voicemail please call her, I returned pt's call she was concerned about draining, but its seems to be getting better now, discussed with pt cont Doxycycline and cont Mupirocin and call back if needed

## 2019-10-01 NOTE — Telephone Encounter (Signed)
Called pt again to make sure she was not draining blood like she report she was Monday after her surgery she report she is not draining blood,she feel the area may be getting better today, discussed with pt we would have like to seen her in the office today if she was still actively bleeding, pt declines

## 2019-10-06 ENCOUNTER — Other Ambulatory Visit: Payer: Self-pay | Admitting: Physical Medicine & Rehabilitation

## 2019-10-06 ENCOUNTER — Other Ambulatory Visit (HOSPITAL_COMMUNITY): Payer: Self-pay | Admitting: Physical Medicine & Rehabilitation

## 2019-10-06 DIAGNOSIS — M5441 Lumbago with sciatica, right side: Secondary | ICD-10-CM

## 2019-10-06 DIAGNOSIS — G8929 Other chronic pain: Secondary | ICD-10-CM

## 2019-10-06 NOTE — Telephone Encounter (Signed)
Thanks for checking up on her.  We will plan on seeing her at her 2 week post-op visit.

## 2019-10-13 ENCOUNTER — Ambulatory Visit (INDEPENDENT_AMBULATORY_CARE_PROVIDER_SITE_OTHER): Payer: Medicare PPO | Admitting: Dermatology

## 2019-10-13 ENCOUNTER — Other Ambulatory Visit: Payer: Self-pay

## 2019-10-13 DIAGNOSIS — L821 Other seborrheic keratosis: Secondary | ICD-10-CM

## 2019-10-13 NOTE — Progress Notes (Signed)
   Follow-Up Visit   Subjective  Elizzie Westergard is a 86 y.o. female who presents for the following: Follow-up (SK removal on right lower abdomen). She is using mupirocin 2% ointment daily and will finish doxycycline tonight.  Post-op wound check today following shave removal 2 weeks ago.   The following portions of the chart were reviewed this encounter and updated as appropriate:      Review of Systems:  No other skin or systemic complaints except as noted in HPI or Assessment and Plan.  Objective  Well appearing patient in no apparent distress; mood and affect are within normal limits.  A focused examination was performed including right lower abdomen. Relevant physical exam findings are noted in the Assessment and Plan.  Objective  Right Lower Abdomen: Large ulceration with some focal yellow adherent exudate, no evidence of infection.  Images     Assessment & Plan  Seborrheic keratosis Right Lower Abdomen  Continue doxycycline 100mg  until finished. Doxycycline should be taken with food to prevent nausea. Do not lay down for 30 minutes after taking. Be cautious with sun exposure and use good sun protection while on this medication. Pregnant women should not take this medication.   Cleanse area daily with soap and water Cont mupirocin 2% ointment with daily bandage changes.   Return in about 6 weeks (around 11/24/2019) for f/u SK R lower abd.   IJamesetta Orleans, CMA, am acting as scribe for Brendolyn Patty, MD .  Documentation: I have reviewed the above documentation for accuracy and completeness, and I agree with the above.  Brendolyn Patty MD

## 2019-10-13 NOTE — Patient Instructions (Signed)
Wound Care Instructions  1. Cleanse wound gently with soap and water once a day then pat dry with clean gauze. Apply a thing coat of Petrolatum (petroleum jelly, "Vaseline") over the wound (unless you have an allergy to this). We recommend that you use a new, sterile tube of Vaseline. Do not pick or remove scabs. Do not remove the yellow or white "healing tissue" from the base of the wound.  2. Cover the wound with fresh, clean, nonstick gauze and secure with paper tape. You may use Band-Aids in place of gauze and tape if the would is small enough, but would recommend trimming much of the tape off as there is often too much. Sometimes Band-Aids can irritate the skin.  3. If you experience any problems, such as abnormal amounts of bleeding, swelling, significant bruising, significant pain, or evidence of infection, please call the office immediately.    

## 2019-10-22 ENCOUNTER — Other Ambulatory Visit: Payer: Self-pay | Admitting: Physical Medicine & Rehabilitation

## 2019-10-22 DIAGNOSIS — G8929 Other chronic pain: Secondary | ICD-10-CM

## 2019-10-22 DIAGNOSIS — M5442 Lumbago with sciatica, left side: Secondary | ICD-10-CM

## 2019-10-28 ENCOUNTER — Ambulatory Visit
Admission: RE | Admit: 2019-10-28 | Discharge: 2019-10-28 | Disposition: A | Discharge status: Home / Self Care | Payer: Medicare PPO | Source: Ambulatory Visit | Attending: Physical Medicine & Rehabilitation | Admitting: Physical Medicine & Rehabilitation

## 2019-10-28 ENCOUNTER — Other Ambulatory Visit: Payer: Self-pay

## 2019-10-28 DIAGNOSIS — M5442 Lumbago with sciatica, left side: Secondary | ICD-10-CM

## 2019-10-28 DIAGNOSIS — M4317 Spondylolisthesis, lumbosacral region: Secondary | ICD-10-CM | POA: Insufficient documentation

## 2019-10-28 DIAGNOSIS — M5136 Other intervertebral disc degeneration, lumbar region: Secondary | ICD-10-CM | POA: Diagnosis not present

## 2019-10-28 DIAGNOSIS — M5441 Lumbago with sciatica, right side: Secondary | ICD-10-CM | POA: Diagnosis not present

## 2019-10-28 DIAGNOSIS — M419 Scoliosis, unspecified: Secondary | ICD-10-CM | POA: Insufficient documentation

## 2019-10-28 DIAGNOSIS — G8929 Other chronic pain: Secondary | ICD-10-CM | POA: Diagnosis not present

## 2019-10-28 DIAGNOSIS — M4807 Spinal stenosis, lumbosacral region: Secondary | ICD-10-CM | POA: Diagnosis not present

## 2019-10-28 DIAGNOSIS — M48061 Spinal stenosis, lumbar region without neurogenic claudication: Secondary | ICD-10-CM | POA: Diagnosis not present

## 2019-10-28 LAB — CBC WITH DIFFERENTIAL/PLATELET
Abs Immature Granulocytes: 0.02 10*3/uL (ref 0.00–0.07)
Basophils Absolute: 0 10*3/uL (ref 0.0–0.1)
Basophils Relative: 0 %
Eosinophils Absolute: 0.1 10*3/uL (ref 0.0–0.5)
Eosinophils Relative: 2 %
HCT: 40.9 % (ref 36.0–46.0)
Hemoglobin: 13.6 g/dL (ref 12.0–15.0)
Immature Granulocytes: 0 %
Lymphocytes Relative: 25 %
Lymphs Abs: 1.4 10*3/uL (ref 0.7–4.0)
MCH: 30 pg (ref 26.0–34.0)
MCHC: 33.3 g/dL (ref 30.0–36.0)
MCV: 90.3 fL (ref 80.0–100.0)
Monocytes Absolute: 0.9 10*3/uL (ref 0.1–1.0)
Monocytes Relative: 15 %
Neutro Abs: 3.3 10*3/uL (ref 1.7–7.7)
Neutrophils Relative %: 58 %
Platelets: 169 10*3/uL (ref 150–400)
RBC: 4.53 MIL/uL (ref 3.87–5.11)
RDW: 13.3 % (ref 11.5–15.5)
WBC: 5.7 10*3/uL (ref 4.0–10.5)
nRBC: 0 % (ref 0.0–0.2)

## 2019-10-28 LAB — PROTIME-INR
INR: 1.1 (ref 0.8–1.2)
Prothrombin Time: 13.7 seconds (ref 11.4–15.2)

## 2019-10-28 LAB — APTT: aPTT: 38 seconds — ABNORMAL HIGH (ref 24–36)

## 2019-10-28 MED ORDER — IOHEXOL 180 MG/ML  SOLN
10.0000 mL | Freq: Once | INTRAMUSCULAR | Status: AC | PRN
  Administered 2019-10-28: 10 mL

## 2019-10-28 MED ORDER — ACETAMINOPHEN 325 MG PO TABS: 650.0000 mg | ORAL_TABLET | ORAL | Status: DC | PRN

## 2019-10-28 NOTE — Procedures (Signed)
Lumbar puncture without difficulty for lumbar myelogram Contrast instilled without difficulty  Complications:  None  Blood Loss: none  See dictation in canopy pacs

## 2019-10-28 NOTE — Discharge Instructions (Signed)
Lumbar Puncture, Care After This sheet gives you information about how to care for yourself after your procedure. Your health care provider may also give you more specific instructions. If you have problems or questions, contact your health care provider. What can I expect after the procedure? After the procedure, it is common to have:  Mild discomfort or pain at the puncture site.  A mild headache that is relieved with pain medicines. Follow these instructions at home: Activity   Lie down flat or rest for as long as directed by your health care provider.  Return to your normal activities as told by your health care provider. Ask your health care provider what activities are safe for you.  Avoid lifting anything heavier than 10 lb (4.5 kg) for at least 12 hours after the procedure.  Do not drive for 24 hours if you were given a medicine to help you relax (sedative) during your procedure.  Do not drive or use heavy machinery while taking prescription pain medicine. Puncture site care  Remove or change your bandage (dressing) as told by your health care provider.  Check your puncture area every day for signs of infection. Check for: ? More pain. ? Redness or swelling. ? Fluid or blood leaking from the puncture site. ? Warmth. ? Pus or a bad smell. General instructions  Take over-the-counter and prescription medicines only as told by your health care provider.  Drink enough fluids to keep your urine clear or pale yellow. Your health care provider may recommend drinking caffeine to prevent a headache.  Keep all follow-up visits as told by your health care provider. This is important. Contact a health care provider if:  You have fever or chills.  You have nausea or vomiting.  You have a headache that lasts for more than 2 days or does not get better with medicine. Get help right away if:  You develop any of the following in your  legs: ? Weakness. ? Numbness. ? Tingling.  You are unable to control when you urinate or have a bowel movement (incontinence).  You have signs of infection around your puncture site, such as: ? More pain. ? Redness or swelling. ? Fluid or blood leakage. ? Warmth. ? Pus or a bad smell.  You are dizzy or you feel like you might faint.  You have a severe headache, especially when you sit or stand. Summary  A lumbar puncture is a procedure in which a small needle is inserted into the lower back to remove fluid that surrounds the brain and spinal cord.  After this procedure, it is common to have a headache and pain around the needle insertion area.  Lying flat, staying hydrated, and drinking caffeine can help prevent headaches.  Monitor your needle insertion site for signs of infection, including warmth, fluid, or more pain.  Get help right away if you develop leg weakness, leg numbness, incontinence, or severe headaches. This information is not intended to replace advice given to you by your health care provider. Make sure you discuss any questions you have with your health care provider. Document Revised: 06/21/2016 Document Reviewed: 06/21/2016 Elsevier Patient Education  2020 [employer]  

## 2019-11-07 DIAGNOSIS — M545 Low back pain, unspecified: Secondary | ICD-10-CM | POA: Insufficient documentation

## 2019-11-07 DIAGNOSIS — M48061 Spinal stenosis, lumbar region without neurogenic claudication: Secondary | ICD-10-CM | POA: Insufficient documentation

## 2019-11-07 DIAGNOSIS — G8929 Other chronic pain: Secondary | ICD-10-CM | POA: Insufficient documentation

## 2019-11-26 ENCOUNTER — Ambulatory Visit
Admission: RE | Admit: 2019-11-26 | Discharge: 2019-11-26 | Disposition: A | Discharge status: Home / Self Care | Payer: Medicare PPO | Source: Ambulatory Visit | Attending: Radiation Oncology | Admitting: Radiation Oncology

## 2019-11-26 ENCOUNTER — Other Ambulatory Visit: Payer: Self-pay

## 2019-11-26 ENCOUNTER — Ambulatory Visit: Payer: Medicare PPO | Admitting: Dermatology

## 2019-11-26 DIAGNOSIS — C3412 Malignant neoplasm of upper lobe, left bronchus or lung: Secondary | ICD-10-CM

## 2019-11-26 LAB — POCT I-STAT CREATININE: Creatinine, Ser: 1.1 mg/dL — ABNORMAL HIGH (ref 0.44–1.00)

## 2019-11-26 MED ORDER — IOHEXOL 300 MG/ML  SOLN
75.0000 mL | Freq: Once | INTRAMUSCULAR | Status: AC | PRN
  Administered 2019-11-26: 60 mL via INTRAVENOUS

## 2019-12-04 ENCOUNTER — Other Ambulatory Visit: Payer: Self-pay

## 2019-12-04 ENCOUNTER — Ambulatory Visit
Admission: RE | Admit: 2019-12-04 | Discharge: 2019-12-04 | Disposition: A | Discharge status: Home / Self Care | Payer: Medicare PPO | Source: Ambulatory Visit | Attending: Radiation Oncology | Admitting: Radiation Oncology

## 2019-12-04 ENCOUNTER — Other Ambulatory Visit: Payer: Self-pay | Admitting: *Deleted

## 2019-12-04 DIAGNOSIS — C3412 Malignant neoplasm of upper lobe, left bronchus or lung: Secondary | ICD-10-CM

## 2020-01-29 DIAGNOSIS — Z9689 Presence of other specified functional implants: Secondary | ICD-10-CM | POA: Insufficient documentation

## 2020-08-09 ENCOUNTER — Encounter: Payer: Self-pay | Admitting: Dermatology

## 2020-08-09 ENCOUNTER — Ambulatory Visit: Payer: Medicare PPO | Admitting: Dermatology

## 2020-08-09 ENCOUNTER — Other Ambulatory Visit: Payer: Self-pay

## 2020-08-09 DIAGNOSIS — L821 Other seborrheic keratosis: Secondary | ICD-10-CM

## 2020-08-09 DIAGNOSIS — L578 Other skin changes due to chronic exposure to nonionizing radiation: Secondary | ICD-10-CM

## 2020-08-09 DIAGNOSIS — Z1283 Encounter for screening for malignant neoplasm of skin: Secondary | ICD-10-CM

## 2020-08-09 DIAGNOSIS — Z86018 Personal history of other benign neoplasm: Secondary | ICD-10-CM

## 2020-08-09 DIAGNOSIS — L814 Other melanin hyperpigmentation: Secondary | ICD-10-CM

## 2020-08-09 DIAGNOSIS — D2271 Melanocytic nevi of right lower limb, including hip: Secondary | ICD-10-CM | POA: Diagnosis not present

## 2020-08-09 DIAGNOSIS — D225 Melanocytic nevi of trunk: Secondary | ICD-10-CM

## 2020-08-09 DIAGNOSIS — L820 Inflamed seborrheic keratosis: Secondary | ICD-10-CM

## 2020-08-09 DIAGNOSIS — D229 Melanocytic nevi, unspecified: Secondary | ICD-10-CM

## 2020-08-09 NOTE — Patient Instructions (Signed)
Cryotherapy Aftercare  . Wash gently with soap and water everyday.   . Apply Vaseline and Band-Aid daily until healed.  

## 2020-08-09 NOTE — Progress Notes (Signed)
Follow-Up Visit   Subjective  Shelley Mahoney is a 87 y.o. female who presents for the following: Annual Exam (Patient here for TBSE.  She has a history of severe dysplastic nevus of the right upper forehead. She has growths around the eyes that are irritated.).  Rub on her mask.   The following portions of the chart were reviewed this encounter and updated as appropriate:       Review of Systems:  No other skin or systemic complaints except as noted in HPI or Assessment and Plan.  Objective  Well appearing patient in no apparent distress; mood and affect are within normal limits.  A full examination was performed including scalp, head, eyes, ears, nose, lips, neck, chest, axillae, abdomen, back, buttocks, bilateral upper extremities, bilateral lower extremities, hands, feet, fingers, toes, fingernails, and toenails. All findings within normal limits unless otherwise noted below.  Objective  Right Upper Forehead: Scar with no evidence of recurrence.   Objective  Right infraocular x4, R upper eyelid x 1, L infraocular x 1, spinal mid upper back x 1 (7): Erythematous keratotic or waxy stuck-on plaque mid back.  Waxy pedunculated papules with erythema periocular  Objective  Left buttock: 3-3mm brown macules  Right 2nd Toe: 4.58mm regular brown macule     Assessment & Plan   Skin cancer screening performed today.  Actinic Damage - chronic, secondary to cumulative UV radiation exposure/sun exposure over time - diffuse scaly erythematous macules with underlying dyspigmentation - Recommend daily broad spectrum sunscreen SPF 30+ to sun-exposed areas, reapply every 2 hours as needed.  - Recommend staying in the shade or wearing long sleeves, sun glasses (UVA+UVB protection) and wide brim hats (4-inch brim around the entire circumference of the hat). - Call for new or changing lesions. Lentigines - Scattered tan macules - Due to sun exposure - Benign-appering,  observe - Recommend daily broad spectrum sunscreen SPF 30+ to sun-exposed areas, reapply every 2 hours as needed. - Call for any changes  Seborrheic Keratoses - Stuck-on, waxy, tan-brown papules and plaques  - Discussed benign etiology and prognosis. - Observe - Call for any changes  Melanocytic Nevi - Tan-brown and/or pink-flesh-colored symmetric macules and papules - Benign appearing on exam today - Observation - Call clinic for new or changing moles - Recommend daily use of broad spectrum spf 30+ sunscreen to sun-exposed areas.    History of dysplastic nevus Right Upper Forehead  Clear. Observe for recurrence. Call clinic for new or changing lesions.  Recommend regular skin exams, daily broad-spectrum spf 30+ sunscreen use, and photoprotection.     Inflamed seborrheic keratosis (7) Right infraocular x4, R upper eyelid x 1, L infraocular x 1, spinal mid upper back x 1  Prior to procedure, discussed risks of blister formation, small wound, skin dyspigmentation, or rare scar following cryotherapy.   LN2 x 1 performed to back. Snip excision performed to right infraocular x 4, right upper eyelid x 1, left infraocular x 1.    Destruction of lesion - Right infraocular x4, R upper eyelid x 1, L infraocular x 1, spinal mid upper back x 1  Destruction method: cryotherapy   Destruction method comment:  And snip excision to periocular Informed consent: discussed and consent obtained   Anesthesia: the lesion was anesthetized in a standard fashion   Anesthetic:  1% lidocaine w/ epinephrine 1-100,000 buffered w/ 8.4% NaHCO3 Lesion destroyed using liquid nitrogen: Yes   Region frozen until ice ball extended beyond lesion: Yes  Hemostasis achieved with:  pressure, aluminum chloride and electrodesiccation Outcome: patient tolerated procedure well with no complications   Post-procedure details: wound care instructions given    Other Related Medications mupirocin ointment (BACTROBAN) 2  %  Nevus (2) Left buttock; Right 2nd Toe  Benign-appearing.  Observation.  Call clinic for new or changing moles.  Recommend daily use of broad spectrum spf 30+ sunscreen to sun-exposed areas.    Return in about 1 year (around 08/09/2021) for TBSE.   IJamesetta Orleans, CMA, am acting as scribe for Brendolyn Patty, MD .  Documentation: I have reviewed the above documentation for accuracy and completeness, and I agree with the above.  Brendolyn Patty MD

## 2020-10-08 ENCOUNTER — Encounter: Payer: Self-pay | Admitting: *Deleted

## 2020-10-25 DIAGNOSIS — S86911A Strain of unspecified muscle(s) and tendon(s) at lower leg level, right leg, initial encounter: Secondary | ICD-10-CM | POA: Insufficient documentation

## 2020-11-25 ENCOUNTER — Ambulatory Visit
Admission: RE | Admit: 2020-11-25 | Discharge: 2020-11-25 | Disposition: A | Discharge status: Home / Self Care | Payer: Medicare PPO | Source: Ambulatory Visit | Attending: Radiation Oncology | Admitting: Radiation Oncology

## 2020-11-25 ENCOUNTER — Other Ambulatory Visit: Payer: Self-pay

## 2020-11-25 DIAGNOSIS — C3412 Malignant neoplasm of upper lobe, left bronchus or lung: Secondary | ICD-10-CM | POA: Insufficient documentation

## 2020-11-25 LAB — POCT I-STAT CREATININE: Creatinine, Ser: 0.9 mg/dL (ref 0.44–1.00)

## 2020-11-25 MED ORDER — IOHEXOL 300 MG/ML  SOLN
75.0000 mL | Freq: Once | INTRAMUSCULAR | Status: AC | PRN
  Administered 2020-11-25: 75 mL via INTRAVENOUS

## 2020-12-01 ENCOUNTER — Other Ambulatory Visit: Payer: Self-pay | Admitting: *Deleted

## 2020-12-01 ENCOUNTER — Other Ambulatory Visit: Payer: Self-pay

## 2020-12-01 ENCOUNTER — Ambulatory Visit
Admission: RE | Admit: 2020-12-01 | Discharge: 2020-12-01 | Disposition: A | Discharge status: Home / Self Care | Payer: Medicare PPO | Source: Ambulatory Visit | Attending: Radiation Oncology | Admitting: Radiation Oncology

## 2020-12-01 VITALS — BP 123/69 | HR 74 | Temp 96.4°F | Resp 18 | Wt 180.9 lb

## 2020-12-01 DIAGNOSIS — R9389 Abnormal findings on diagnostic imaging of other specified body structures: Secondary | ICD-10-CM

## 2020-12-01 DIAGNOSIS — C3412 Malignant neoplasm of upper lobe, left bronchus or lung: Secondary | ICD-10-CM | POA: Diagnosis not present

## 2020-12-01 NOTE — Progress Notes (Signed)
Radiation Oncology Follow up Note  Name: Shelley Mahoney   Date:   12/01/2020 MRN:  [id number] DOB: Jan 14, 1932    This 87 y.o. female presents to the clinic today for 3 and half year follow-up status post SBRT to her left upper lobe for stage I adenocarcinoma.  REFERRING PROVIDER: Kirk Ruths, MD  HPI: Patient is a 87 year old female now about 3-1/2 years status post SBRT to her left upper lobe for stage I adenocarcinoma.  Seen today in routine follow-up she is doing fairly well she does have significant problems with her back uses a cane for ambulatory assistance.  She specifically [place] cough hemoptysis or chest tightness.  She had a recent CT scan.  Showing a 3 mm right lower lobe nodule suggestive follow-up.  There is also lateral left subpectoral lymph node slightly enlarged from prior examination on my review.  COMPLICATIONS OF TREATMENT: none  FOLLOW UP COMPLIANCE: keeps appointments   PHYSICAL EXAM:  BP 123/69 (BP Location: Left Arm, Patient Position: Sitting)   Pulse 74   Temp (!) 96.4 F (35.8 C) (Tympanic)   Resp 18   Wt 180 lb 14.4 oz (82.1 kg)   BMI 26.71 kg/m  Well-developed well-nourished patient in NAD. HEENT reveals PERLA, EOMI, discs not visualized.  Oral cavity is clear. No oral mucosal lesions are identified. Neck is clear without evidence of cervical or supraclavicular adenopathy. Lungs are clear to A&P. Cardiac examination is essentially unremarkable with regular rate and rhythm without murmur rub or thrill. Abdomen is benign with no organomegaly or masses noted. Motor sensory and DTR levels are equal and symmetric in the upper and lower extremities. Cranial nerves II through XII are grossly intact. Proprioception is intact. No peripheral adenopathy or edema is identified. No motor or sensory levels are noted. Crude visual fields are within normal range.  RADIOLOGY RESULTS: CT scan reviewed compatible with above-stated findings  PLAN: At this time I will  see her back in 6 months with repeat CT scan of the chest at that time.  I am also ordering diagnostic mammograms based on the subpectoral lymph node enlarging although I believe the likelihood of any pathology is small.  Patient comprehends my recommendations well.  Patient knows to call with any concerns.  I would like to take this opportunity to thank you for allowing me to participate in the care of your patient.Noreene Filbert, MD

## 2020-12-02 ENCOUNTER — Ambulatory Visit: Payer: Medicare PPO | Admitting: Radiation Oncology

## 2020-12-06 ENCOUNTER — Other Ambulatory Visit: Payer: Medicare PPO

## 2020-12-08 ENCOUNTER — Ambulatory Visit
Admission: RE | Admit: 2020-12-08 | Discharge: 2020-12-08 | Disposition: A | Discharge status: Home / Self Care | Payer: Medicare PPO | Source: Ambulatory Visit | Attending: Radiation Oncology | Admitting: Radiation Oncology

## 2020-12-08 ENCOUNTER — Encounter: Payer: Self-pay | Admitting: Radiology

## 2020-12-08 ENCOUNTER — Other Ambulatory Visit: Payer: Self-pay

## 2020-12-08 DIAGNOSIS — R590 Localized enlarged lymph nodes: Secondary | ICD-10-CM | POA: Insufficient documentation

## 2020-12-08 DIAGNOSIS — R9389 Abnormal findings on diagnostic imaging of other specified body structures: Secondary | ICD-10-CM | POA: Diagnosis present

## 2020-12-08 DIAGNOSIS — C3412 Malignant neoplasm of upper lobe, left bronchus or lung: Secondary | ICD-10-CM | POA: Diagnosis present

## 2020-12-08 DIAGNOSIS — C3490 Malignant neoplasm of unspecified part of unspecified bronchus or lung: Secondary | ICD-10-CM | POA: Insufficient documentation

## 2020-12-08 HISTORY — DX: Personal history of irradiation: Z92.3

## 2021-01-27 ENCOUNTER — Encounter: Payer: Self-pay | Admitting: *Deleted

## 2021-04-09 ENCOUNTER — Emergency Department
Admission: EM | Admit: 2021-04-09 | Discharge: 2021-04-10 | Disposition: A | Discharge status: Home / Self Care | Payer: Medicare PPO | Attending: Emergency Medicine | Admitting: Emergency Medicine

## 2021-04-09 ENCOUNTER — Other Ambulatory Visit: Payer: Self-pay

## 2021-04-09 ENCOUNTER — Encounter: Payer: Self-pay | Admitting: Emergency Medicine

## 2021-04-09 DIAGNOSIS — Z20822 Contact with and (suspected) exposure to covid-19: Secondary | ICD-10-CM | POA: Insufficient documentation

## 2021-04-09 DIAGNOSIS — R197 Diarrhea, unspecified: Secondary | ICD-10-CM | POA: Insufficient documentation

## 2021-04-09 DIAGNOSIS — N183 Chronic kidney disease, stage 3 unspecified: Secondary | ICD-10-CM | POA: Diagnosis not present

## 2021-04-09 DIAGNOSIS — Z85118 Personal history of other malignant neoplasm of bronchus and lung: Secondary | ICD-10-CM | POA: Insufficient documentation

## 2021-04-09 DIAGNOSIS — Z7982 Long term (current) use of aspirin: Secondary | ICD-10-CM | POA: Insufficient documentation

## 2021-04-09 DIAGNOSIS — R112 Nausea with vomiting, unspecified: Secondary | ICD-10-CM | POA: Insufficient documentation

## 2021-04-09 DIAGNOSIS — E039 Hypothyroidism, unspecified: Secondary | ICD-10-CM | POA: Diagnosis not present

## 2021-04-09 DIAGNOSIS — Z8541 Personal history of malignant neoplasm of cervix uteri: Secondary | ICD-10-CM | POA: Insufficient documentation

## 2021-04-09 LAB — COMPREHENSIVE METABOLIC PANEL
ALT: 23 U/L (ref 0–44)
AST: 29 U/L (ref 15–41)
Albumin: 3.9 g/dL (ref 3.5–5.0)
Alkaline Phosphatase: 44 U/L (ref 38–126)
Anion gap: 7 (ref 5–15)
BUN: 29 mg/dL — ABNORMAL HIGH (ref 8–23)
CO2: 25 mmol/L (ref 22–32)
Calcium: 8 mg/dL — ABNORMAL LOW (ref 8.9–10.3)
Chloride: 105 mmol/L (ref 98–111)
Creatinine, Ser: 1.14 mg/dL — ABNORMAL HIGH (ref 0.44–1.00)
GFR, Estimated: 47 mL/min — ABNORMAL LOW (ref >60)
Glucose, Bld: 136 mg/dL — ABNORMAL HIGH (ref 70–99)
Potassium: 3.8 mmol/L (ref 3.5–5.1)
Sodium: 137 mmol/L (ref 135–145)
Total Bilirubin: 0.9 mg/dL (ref 0.3–1.2)
Total Protein: 7.2 g/dL (ref 6.5–8.1)

## 2021-04-09 LAB — CBC
HCT: 42.7 % (ref 36.0–46.0)
Hemoglobin: 14 g/dL (ref 12.0–15.0)
MCH: 30.4 pg (ref 26.0–34.0)
MCHC: 32.8 g/dL (ref 30.0–36.0)
MCV: 92.6 fL (ref 80.0–100.0)
Platelets: 170 10*3/uL (ref 150–400)
RBC: 4.61 MIL/uL (ref 3.87–5.11)
RDW: 13.9 % (ref 11.5–15.5)
WBC: 6.3 10*3/uL (ref 4.0–10.5)
nRBC: 0 % (ref 0.0–0.2)

## 2021-04-09 LAB — RESP PANEL BY RT-PCR (FLU A&B, COVID) ARPGX2
Influenza A by PCR: NEGATIVE
Influenza B by PCR: NEGATIVE
SARS Coronavirus 2 by RT PCR: NEGATIVE

## 2021-04-09 LAB — LIPASE, BLOOD: Lipase: 41 U/L (ref 11–51)

## 2021-04-09 LAB — TROPONIN I (HIGH SENSITIVITY): Troponin I (High Sensitivity): 12 ng/L (ref <18)

## 2021-04-09 NOTE — ED Triage Notes (Signed)
Pt states this morning had an episode of vomiting and diarrhea then slept all day. Pt states increased thirst from baseline. Pt denies any pain at this time.

## 2021-04-10 NOTE — ED Provider Notes (Signed)
[facility] Emergency Department Provider Note  ____________________________________________   Event Date/Time   First MD Initiated Contact with Patient 04/09/21 2339     (approximate)  I have reviewed the triage vital signs and the nursing notes.   HISTORY  Chief Complaint Emesis, Diarrhea, and Fatigue    HPI Shelley Mahoney is a 87 y.o. female with medical history as listed below who presents for evaluation of an episode of vomiting and diarrhea.  This episode happened earlier today and then she has been tired afterwards.  However at this time she says that she feels better.  She currently has no symptoms.  She has had no additional nausea, vomiting, nor diarrhea, and she specifically denies fever, neck pain, chest pain, shortness of breath, abdominal pain, and dysuria.  The episode occurred acutely and nothing in particular made it better or worse.  She is here with a representative from [facility] to provide assistance for her while she is here.  [place] the [place] side.  Her symptoms were moderate to severe and rather abrupt but she currently feels better.     Past Medical History:  Diagnosis Date   Carotid stenosis    Carotid stenosis    Chronic kidney disease    Stage 3, GFR30-59 ml-min   CKD (chronic kidney disease)    Dysplastic nevus 09/22/2019   Right upper forehead. Severe atypia.   Hypothyroidism    Lung cancer ([place]) 2018   Neuro-degenerative disorders ([place])    Personal history of radiation therapy    Uterine cancer ([place])    ovarian    Patient Active Problem List   Diagnosis Date Noted   Lung cancer ([place]) 12/08/2020   History of skin cancer 08/04/2019   Dysphonia of essential tremor 05/26/2016   Malignant neoplasm of upper lobe of left lung ([place]) 05/24/2016   Primary cancer of left upper lobe of lung ([place]) 04/28/2016   Iron deficiency 04/20/2016   Periodic limb movement sleep disorder 04/20/2016   Mass of upper lobe  of left lung 04/11/2016   Restless leg syndrome 01/13/2016   Primary insomnia 12/01/2014   CKD (chronic kidney disease) stage 3, GFR 30-59 ml/min (HCC) 11/18/2013   Carotid artery disease ([place]) 11/02/2013   Hypothyroid 11/02/2013   Neuropathy 11/02/2013   Vocal tremor 01/21/2013    Past Surgical History:  Procedure Laterality Date   ABDOMINAL HYSTERECTOMY     Total-does not have ovaries   COLONOSCOPY WITH PROPOFOL N/A 12/08/2014   Procedure: COLONOSCOPY WITH PROPOFOL;  Surgeon: Lollie Sails, MD;  Location: [facility] ENDOSCOPY;  Service: Endoscopy;  Laterality: N/A;   EYE SURGERY     cataracts ou   FLEXIBLE BRONCHOSCOPY N/A 04/21/2016   Procedure: FLEXIBLE BRONCHOSCOPY;  Surgeon: Wilhelmina Mcardle, MD;  Location: [facility] ORS;  Service: Pulmonary;  Laterality: N/A;   THYROID SURGERY      Prior to Admission medications   Medication Sig Start Date End Date Taking? Authorizing Provider  ASPIRIN 81 PO Take 81 mg by mouth daily. 11/11/09   [provider]  cholecalciferol (VITAMIN D) 1000 units tablet Take 1,000 Units by mouth daily.    [provider]  gabapentin (NEURONTIN) 100 MG capsule Take 100 mg by mouth 3 (three) times daily. 100 mg in the morning, 200 mg at noon, 300 mg at bedtime.    [provider]  IRON CR PO Take 45 mg by mouth daily.    [provider]  LEVOXYL 88  MCG tablet  08/08/19   [provider]  MAGNESIUM GLYCINATE PLUS PO Take 400 mg by mouth 2 (two) times daily.     [provider]  Multiple Vitamin (MULTIVITAMIN) tablet Take 1 tablet by mouth daily.    [provider]  Multiple Vitamins-Minerals (PRESERVISION AREDS) CAPS Take by mouth.    [provider]  mupirocin ointment (BACTROBAN) 2 % Apply to affected area on abdomen 1-2 times a day with bandage changes until healed. 09/29/19   Brendolyn Patty, MD  pramipexole (MIRAPEX) 0.125 MG tablet Take 0.25 mg by mouth at bedtime.     [provider]   primidone (MYSOLINE) 50 MG tablet Take 50 mg by mouth 3 (three) times daily. 1 in am  2 at noon 2 at hs    [provider]  Probiotic Product ([place]) Take 1 tablet by mouth daily.     [provider]  simvastatin (ZOCOR) 40 MG tablet Take 40 mg by mouth at bedtime.     [provider]  zaleplon (SONATA) 10 MG capsule  07/29/19   [provider]    Allergies Patient has no known allergies.  Family History  Problem Relation Age of Onset   Breast cancer Mother    Other Father        erythrocytosis    Colon cancer Maternal Grandmother     Social History Social History   Tobacco Use   Smoking status: Never   Smokeless tobacco: Never  Substance Use Topics   Alcohol use: No   Drug use: No    Review of Systems Constitutional: No fever/chills.  Increased fatigue today. Eyes: No visual changes. ENT: No sore throat. Cardiovascular: Denies chest pain. Respiratory: Denies shortness of breath. Gastrointestinal: Positive for 1 episode of vomiting and loose stool, now resolved.  Negative for abdominal pain. Genitourinary: Negative for dysuria. Musculoskeletal: Negative for neck pain.  Negative for back pain. Integumentary: Negative for rash. Neurological: Negative for headaches, focal weakness or numbness.   ____________________________________________   PHYSICAL EXAM:  VITAL SIGNS: ED Triage Vitals  Enc Vitals Group     BP 04/09/21 2247 (!) 99/47     Pulse Rate 04/09/21 2247 97     Resp 04/09/21 2247 20     Temp 04/09/21 2247 99 F (37.2 C)     Temp Source 04/09/21 2247 Oral     SpO2 04/09/21 2247 93 %     Weight 04/09/21 2245 79.4 kg (175 lb)     Height 04/09/21 2245 1.753 m (5\' 9" )     Head Circumference --      Peak Flow --      Pain Score 04/09/21 2245 0     Pain Loc --      Pain Edu? --      Excl. in [place]? --     Constitutional: Alert and oriented.  Elderly but in no distress.  Able to converse and laugh  and joke with me. Eyes: Conjunctivae are normal.  Head: Atraumatic. Nose: No congestion/rhinnorhea. Mouth/Throat: Patient is wearing a mask. Neck: No stridor.  No meningeal signs.   Cardiovascular: Normal rate, regular rhythm. Good peripheral circulation. Respiratory: Normal respiratory effort.  No retractions. Gastrointestinal: Soft and nontender. No distention.  Musculoskeletal: No lower extremity tenderness nor edema. No gross deformities of extremities. Neurologic:  Normal speech and language. No gross focal neurologic deficits are appreciated.  Skin:  Skin is warm, dry and intact. Psychiatric: Mood and affect are  normal. Speech and behavior are normal.  ____________________________________________   LABS (all labs ordered are listed, but only abnormal results are displayed)  Labs Reviewed  COMPREHENSIVE METABOLIC PANEL - Abnormal; Notable for the following components:      Result Value   Glucose, Bld 136 (*)    BUN 29 (*)    Creatinine, Ser 1.14 (*)    Calcium 8.0 (*)    GFR, Estimated 47 (*)    All other components within normal limits  RESP PANEL BY RT-PCR (FLU A&B, COVID) ARPGX2  LIPASE, BLOOD  CBC  URINALYSIS, ROUTINE W REFLEX MICROSCOPIC  TROPONIN I (HIGH SENSITIVITY)  TROPONIN I (HIGH SENSITIVITY)   ____________________________________________  EKG  ED ECG REPORT I, Hinda Kehr, the attending physician, personally viewed and interpreted this ECG.  Date: 04/09/2021 EKG Time: 22: 56 Rate: 93 Rhythm: normal sinus rhythm QRS Axis: normal Intervals: normal ST/T Wave abnormalities: Non-specific ST segment / T-wave changes, but no clear evidence of acute ischemia. Narrative Interpretation: no definitive evidence of acute ischemia; does not meet STEMI criteria.  ____________________________________________   INITIAL IMPRESSION / MDM / ASSESSMENT AND PLAN / ED COURSE  As part of my medical decision making, I reviewed the following data within the [place] History obtained from representative from [facility], Nursing notes reviewed and incorporated, Labs reviewed , EKG interpreted , Old chart reviewed, and Notes from prior ED visits   Differential diagnosis includes, but is not limited to, SBO/ileus, foodborne pathogen, nonspecific gastroenteritis, viral respiratory illness, acute intra-abdominal bacterial infection.  Patient's vital signs are stable with a blood pressure is slightly low but generally appropriate for her body habitus.  She has no symptoms at this point after a couple of hours of observation in the emergency department.  Nonischemic EKG.  No urinary symptoms.  Normal CBC, lipase, high-sensitivity troponin.  Comprehensive metabolic panel shows a slight elevation of creatinine but this is consistent with her prior values as well.  Clinically she does not appear dehydrated.  Her respiratory viral panel is negative.  She has no tenderness to palpation of the abdomen and no symptoms.  I asked her what her preferences and she prefers to go home.  I see no evidence of an emergent medical condition nor any indication she requires further evaluation.  Of note, the representative who came with her from [facility] to the ED asked if I would fill out an [place]-2 form so that the patient could go to the convalescent unit of [facility] for a couple of days.  I explained that ED physicians typically do not fill out these forms and that I would feel it is inappropriate to do so given that I am finding no evidence of an emergent medical condition and that she can be discharged back to her facility.  I encouraged them to follow-up with her primary care provider or any provider that provide services at [facility] to see if they still feel this is necessary but it is not appropriate for me to do so from the emergency department tonight.  The patient states that she feels comfortable returning back to her home without any additional assistance.  I  provided my usual and customary return precautions.  ____________________________________________  FINAL CLINICAL IMPRESSION(S) / ED DIAGNOSES  Final diagnoses:  Nausea vomiting and diarrhea     MEDICATIONS GIVEN DURING THIS VISIT:  Medications - No data to display   ED Discharge Orders     None  Note:  This document was prepared using Dragon voice recognition software and may include unintentional dictation errors.   Hinda Kehr, MD 04/10/21 [phone]

## 2021-04-10 NOTE — Discharge Instructions (Addendum)
Your workup in the Emergency Department today was reassuring.  We did not find any specific abnormalities.  We recommend you drink plenty of fluids, take your regular medications and/or any new ones prescribed today, and follow up with the doctor(s) listed in these documents as recommended.  Return to the Emergency Department if you develop new or worsening symptoms that concern you.  

## 2021-05-30 ENCOUNTER — Other Ambulatory Visit: Payer: Self-pay

## 2021-05-30 ENCOUNTER — Ambulatory Visit
Admission: RE | Admit: 2021-05-30 | Discharge: 2021-05-30 | Disposition: A | Discharge status: Home / Self Care | Payer: Medicare PPO | Source: Ambulatory Visit | Attending: Radiation Oncology | Admitting: Radiation Oncology

## 2021-05-30 DIAGNOSIS — C3412 Malignant neoplasm of upper lobe, left bronchus or lung: Secondary | ICD-10-CM | POA: Diagnosis not present

## 2021-05-30 LAB — POCT I-STAT CREATININE: Creatinine, Ser: 1 mg/dL (ref 0.44–1.00)

## 2021-05-30 MED ORDER — IOHEXOL 300 MG/ML  SOLN
75.0000 mL | Freq: Once | INTRAMUSCULAR | Status: AC | PRN
  Administered 2021-05-30: 75 mL via INTRAVENOUS

## 2021-06-02 ENCOUNTER — Other Ambulatory Visit: Payer: Self-pay

## 2021-06-02 ENCOUNTER — Ambulatory Visit
Admission: RE | Admit: 2021-06-02 | Discharge: 2021-06-02 | Disposition: A | Discharge status: Home / Self Care | Payer: Medicare PPO | Source: Ambulatory Visit | Attending: Radiation Oncology | Admitting: Radiation Oncology

## 2021-06-02 ENCOUNTER — Encounter: Payer: Self-pay | Admitting: Radiation Oncology

## 2021-06-02 VITALS — BP 105/51 | HR 68 | Temp 96.1°F | Resp 19 | Wt 180.6 lb

## 2021-06-02 DIAGNOSIS — C3412 Malignant neoplasm of upper lobe, left bronchus or lung: Secondary | ICD-10-CM

## 2021-06-02 NOTE — Progress Notes (Signed)
Radiation Oncology Follow up Note  Name: Shelley Mahoney   Date:   06/02/2021 MRN:  [id number] DOB: 1932-03-19    This 88 y.o. female presents to the clinic today for over 4-year follow-up status post SBRT to her left upper lobe for stage I adenocarcinoma.  REFERRING PROVIDER: Kirk Ruths, MD  HPI: Patient is an 88 year old female now out over 4 years having completed SBRT to her left upper lobe stage I adenocarcinoma.  Seen today in routine follow-up she is doing well she continues to have some shortness of breath and dyspnea dyspnea on exertion.  No hemoptysis cough or chest tightness.  Recent CT scan.  Shows stable radiation fibrosis in the mid lung no evidence of tumor progression.  No evidence of suspicious for metastatic disease in the chest.  COMPLICATIONS OF TREATMENT: none  FOLLOW UP COMPLIANCE: keeps appointments   PHYSICAL EXAM:  BP (!) 105/51    Pulse 68    Temp (!) 96.1 F (35.6 C)    Resp 19    Wt 180 lb 9.6 oz (81.9 kg)    SpO2 99%    BMI 26.67 kg/m  Well-developed well-nourished patient in NAD. HEENT reveals PERLA, EOMI, discs not visualized.  Oral cavity is clear. No oral mucosal lesions are identified. Neck is clear without evidence of cervical or supraclavicular adenopathy. Lungs are clear to A&P. Cardiac examination is essentially unremarkable with regular rate and rhythm without murmur rub or thrill. Abdomen is benign with no organomegaly or masses noted. Motor sensory and DTR levels are equal and symmetric in the upper and lower extremities. Cranial nerves II through XII are grossly intact. Proprioception is intact. No peripheral adenopathy or edema is identified. No motor or sensory levels are noted. Crude visual fields are within normal range.  RADIOLOGY RESULTS: CT scans reviewed compatible with above-stated findings  PLAN: Patient continues to do well with no evidence of progression of disease out over 4 years from SBRT.  I have suggested she may want to  see the pulmonologist to [facility] clinic to tune up some of her pulmonary functions.  I am going to discontinue follow-up care should continue close follow-up care with family practice.  Suggest at least a yearly chest x-ray.  Patient knows to call in the future with any concerns.  I would like to take this opportunity to thank you for allowing me to participate in the care of your patient.Noreene Filbert, MD

## 2021-06-08 ENCOUNTER — Ambulatory Visit: Payer: Medicare PPO | Admitting: Radiation Oncology

## 2021-08-02 ENCOUNTER — Other Ambulatory Visit: Payer: Self-pay

## 2021-08-02 ENCOUNTER — Ambulatory Visit: Payer: Medicare PPO | Admitting: Dermatology

## 2021-08-02 DIAGNOSIS — L814 Other melanin hyperpigmentation: Secondary | ICD-10-CM

## 2021-08-02 DIAGNOSIS — D2271 Melanocytic nevi of right lower limb, including hip: Secondary | ICD-10-CM

## 2021-08-02 DIAGNOSIS — Z1283 Encounter for screening for malignant neoplasm of skin: Secondary | ICD-10-CM | POA: Diagnosis not present

## 2021-08-02 DIAGNOSIS — D225 Melanocytic nevi of trunk: Secondary | ICD-10-CM

## 2021-08-02 DIAGNOSIS — Z86018 Personal history of other benign neoplasm: Secondary | ICD-10-CM

## 2021-08-02 DIAGNOSIS — L820 Inflamed seborrheic keratosis: Secondary | ICD-10-CM

## 2021-08-02 DIAGNOSIS — L821 Other seborrheic keratosis: Secondary | ICD-10-CM

## 2021-08-02 DIAGNOSIS — D229 Melanocytic nevi, unspecified: Secondary | ICD-10-CM

## 2021-08-02 DIAGNOSIS — D485 Neoplasm of uncertain behavior of skin: Secondary | ICD-10-CM

## 2021-08-02 DIAGNOSIS — D171 Benign lipomatous neoplasm of skin and subcutaneous tissue of trunk: Secondary | ICD-10-CM | POA: Diagnosis not present

## 2021-08-02 DIAGNOSIS — L578 Other skin changes due to chronic exposure to nonionizing radiation: Secondary | ICD-10-CM

## 2021-08-02 NOTE — Progress Notes (Signed)
? ?Follow-Up Visit ?  ?Subjective  ?Shelley Mahoney is a 88 y.o. female who presents for the following: Annual Exam. ? ?The patient presents for Total-Body Skin Exam (TBSE) for skin cancer screening and mole check.  The patient has spots, moles and lesions to be evaluated, some may be new or changing. She has a spot she would like checked on her left arm.  She has several keratosis that are irritated, catch on clothes, and bleed.  She would like them treated/removed. ? ?The following portions of the chart were reviewed this encounter and updated as appropriate:  ?  ?  ? ?Review of Systems:  No other skin or systemic complaints except as noted in HPI or Assessment and Plan. ? ?Objective  ?Well appearing patient in no apparent distress; mood and affect are within normal limits. ? ?A full examination was performed including scalp, head, eyes, ears, nose, lips, neck, chest, axillae, abdomen, back, buttocks, bilateral upper extremities, bilateral lower extremities, hands, feet, fingers, toes, fingernails, and toenails. All findings within normal limits unless otherwise noted below. ? ?Left buttock, Right 2nd toe ?Left buttock: ?3-64mm brown macules ?  ?Right 2nd Toe: ?4.6mm regular brown macule ? ?L spinal mid upper back x 1, L flank x 1, L inguinal crease x 2 (4) ?Erythematous stuck-on, waxy papule or plaque with heme crusting ? ?Left Lower Back ?3.0 x 1.5 cm waxy tan plaque ? ?Left lower abdomen ?1.5cm waxy tan plaque ? ?Left buttock ?0.8 cm fleshy papule ? ? ? ?Assessment & Plan  ?Skin cancer screening performed today. ? ?Actinic Damage ?- chronic, secondary to cumulative UV radiation exposure/sun exposure over time ?- diffuse scaly erythematous macules with underlying dyspigmentation ?- Recommend daily broad spectrum sunscreen SPF 30+ to sun-exposed areas, reapply every 2 hours as needed.  ?- Recommend staying in the shade or wearing long sleeves, sun glasses (UVA+UVB protection) and wide brim hats (4-inch  brim around the entire circumference of the hat). ?- Call for new or changing lesions. ? ?Lentigines ?- Scattered tan macules ?- Due to sun exposure ?- Benign-appering, observe ?- Recommend daily broad spectrum sunscreen SPF 30+ to sun-exposed areas, reapply every 2 hours as needed. ?- Call for any changes ? ?Seborrheic Keratoses ?- Stuck-on, waxy, tan-brown papules and/or plaques  ?- Benign-appearing ?- Discussed benign etiology and prognosis. ?- Observe ?- Call for any changes ? ?Melanocytic Nevi ?- Tan-brown and/or pink-flesh-colored symmetric macules and papules ?- Benign appearing on exam today ?- Observation ?- Call clinic for new or changing moles ?- Recommend daily use of broad spectrum spf 30+ sunscreen to sun-exposed areas.  ? ?History of Dysplastic Nevus ?- No evidence of recurrence today of the right upper forehead ?- Recommend regular full body skin exams ?- Recommend daily broad spectrum sunscreen SPF 30+ to sun-exposed areas, reapply every 2 hours as needed.  ?- Call if any new or changing lesions are noted between office visits ? ?Nevus ?Left buttock, Right 2nd toe ? ?Benign-appearing.  Stable. Observation.  Call clinic for new or changing moles.  Recommend daily use of broad spectrum spf 30+ sunscreen to sun-exposed areas.  ? ?Inflamed seborrheic keratosis (4) ?L spinal mid upper back x 1, L flank x 1, L inguinal crease x 2 ? ?Hx of bleeding ? ?Destruction of lesion - L spinal mid upper back x 1, L flank x 1, L inguinal crease x 2 ? ?Destruction method: cryotherapy   ?Informed consent: discussed and consent obtained   ?Lesion destroyed using liquid  nitrogen: Yes   ?Region frozen until ice ball extended beyond lesion: Yes   ?Outcome: patient tolerated procedure well with no complications   ?Post-procedure details: wound care instructions given   ?Additional details:  Prior to procedure, discussed risks of blister formation, small wound, skin dyspigmentation, or rare scar following cryotherapy.  Recommend Vaseline ointment to treated areas while healing. ? ? ?Neoplasm of uncertain behavior of skin (3) ?Left Lower Back ? ?Epidermal / dermal shaving ? ?Lesion diameter (cm):  3 ?Informed consent: discussed and consent obtained   ?Patient was prepped and draped in usual sterile fashion: Area prepped with alcohol. ?Anesthesia: the lesion was anesthetized in a standard fashion   ?Anesthetic:  1% lidocaine w/ epinephrine 1-100,000 buffered w/ 8.4% NaHCO3 ?Instrument used: flexible razor blade   ?Hemostasis achieved with: pressure, aluminum chloride and electrodesiccation   ?Outcome: patient tolerated procedure well   ?Post-procedure details: wound care instructions given   ?Post-procedure details comment:  Ointment and small bandage applied ? ?Specimen 1 - Surgical pathology ?Differential Diagnosis: Irritated SK vs other ?Check Margins: No ?3.0 x 1.5 cm waxy tan papule ? ?Left lower abdomen ? ?Epidermal / dermal shaving ? ?Lesion diameter (cm):  1.5 ?Informed consent: discussed and consent obtained   ?Patient was prepped and draped in usual sterile fashion: Area prepped with alcohol. ?Anesthesia: the lesion was anesthetized in a standard fashion   ?Anesthetic:  1% lidocaine w/ epinephrine 1-100,000 buffered w/ 8.4% NaHCO3 ?Instrument used: flexible razor blade   ?Hemostasis achieved with: pressure, aluminum chloride and electrodesiccation   ?Outcome: patient tolerated procedure well   ?Post-procedure details: wound care instructions given   ?Post-procedure details comment:  Ointment and small bandage applied ? ?Specimen 2 - Surgical pathology ?Differential Diagnosis: Irritated SK vs other ?Check Margins: No ?1.5cm waxy tan papule ? ?Left buttock ? ?Epidermal / dermal shaving ? ?Lesion diameter (cm):  0.8 ?Informed consent: discussed and consent obtained   ?Patient was prepped and draped in usual sterile fashion: Area prepped with alcohol. ?Anesthesia: the lesion was anesthetized in a standard fashion    ?Anesthetic:  1% lidocaine w/ epinephrine 1-100,000 buffered w/ 8.4% NaHCO3 ?Instrument used: flexible razor blade   ?Hemostasis achieved with: pressure, aluminum chloride and electrodesiccation   ?Outcome: patient tolerated procedure well   ?Post-procedure details: wound care instructions given   ?Post-procedure details comment:  Ointment and small bandage applied ? ?Specimen 3 - Surgical pathology ?Differential Diagnosis: Irritated Nevus vs other ?Check Margins: No ?0.8 cm fleshy papule ? ? ?Return in about 1 year (around 08/03/2022) for TBSE. ? ?I, Jamesetta Orleans, CMA, am acting as scribe for Brendolyn Patty, MD . ? ?Documentation: I have reviewed the above documentation for accuracy and completeness, and I agree with the above. ? ?Brendolyn Patty MD  ? ?

## 2021-08-02 NOTE — Patient Instructions (Addendum)
Cryotherapy Aftercare ? ?Wash gently with soap and water everyday.   ?Apply Vaseline and Band-Aid daily until healed.  ? ? ?Wound Care Instructions ? ?Cleanse wound gently with soap and water once a day then pat dry with clean gauze. Apply a thing coat of Petrolatum (petroleum jelly, "Vaseline") over the wound (unless you have an allergy to this). We recommend that you use a new, sterile tube of Vaseline. Do not pick or remove scabs. Do not remove the yellow or white "healing tissue" from the base of the wound. ? ?Cover the wound with fresh, clean, nonstick gauze and secure with paper tape. You may use Band-Aids in place of gauze and tape if the would is small enough, but would recommend trimming much of the tape off as there is often too much. Sometimes Band-Aids can irritate the skin. ? ?You should call the office for your biopsy report after 1 week if you have not already been contacted. ? ?If you experience any problems, such as abnormal amounts of bleeding, swelling, significant bruising, significant pain, or evidence of infection, please call the office immediately. ? ?FOR ADULT SURGERY PATIENTS: If you need something for pain relief you may take 1 extra strength Tylenol (acetaminophen) AND 2 Ibuprofen (200mg  each) together every 4 hours as needed for pain. (do not take these if you are allergic to them or if you have a reason you should not take them.) Typically, you may only need pain medication for 1 to 3 days.  ? ? ? ?Seborrheic Keratosis ? ?What causes seborrheic keratoses? ?Seborrheic keratoses are harmless, common skin growths that first appear during adult life.  As time goes by, more growths appear.  Some people may develop a large number of them.  Seborrheic keratoses appear on both covered and uncovered body parts.  They are not caused by sunlight.  The tendency to develop seborrheic keratoses can be inherited.  They vary in color from skin-colored to gray, brown, or even black.  They can be either  smooth or have a rough, warty surface.   ?Seborrheic keratoses are superficial and look as if they were stuck on the skin.  Under the microscope this type of keratosis looks like layers upon layers of skin.  That is why at times the top layer may seem to fall off, but the rest of the growth remains and re-grows.   ? ?Treatment ?Seborrheic keratoses do not need to be treated, but can easily be removed in the office.  Seborrheic keratoses often cause symptoms when they rub on clothing or jewelry.  Lesions can be in the way of shaving.  If they become inflamed, they can cause itching, soreness, or burning.  Removal of a seborrheic keratosis can be accomplished by freezing, burning, or surgery. ?If any spot bleeds, scabs, or grows rapidly, please return to have it checked, as these can be an indication of a skin cancer. ? ?If You Need Anything After Your Visit ? ?If you have any questions or concerns for your doctor, please call our main line at [phone] and press option 4 to reach your doctor's medical assistant. If no one answers, please leave a voicemail as directed and we will return your call as soon as possible. Messages left after 4 pm will be answered the following business day.  ? ?You may also send [place] a message via MyChart. We typically respond to MyChart messages within 1-2 business days. ? ?For prescription refills, please ask your pharmacy to contact our office. Our  fax number is [phone]. ? ?If you have an urgent issue when the clinic is closed that cannot wait until the next business day, you can page your doctor at the number below.   ? ?Please note that while we do our best to be available for urgent issues outside of office hours, we are not available 24/7.  ? ?If you have an urgent issue and are unable to reach [place], you may choose to seek medical care at your doctor's office, retail clinic, urgent care center, or emergency room. ? ?If you have a medical emergency, please immediately call 911 or  go to the emergency department. ? ?Pager Numbers ? ?- Dr. Nehemiah Massed: [phone] ? ?- Dr. Laurence Ferrari: [phone] ? ?- Dr. Nicole Kindred: [phone] ? ?In the event of inclement weather, please call our main line at [phone] for an update on the status of any delays or closures. ? ?Dermatology Medication Tips: ?Please keep the boxes that topical medications come in in order to help keep track of the instructions about where and how to use these. Pharmacies typically print the medication instructions only on the boxes and not directly on the medication tubes.  ? ?If your medication is too expensive, please contact our office at [phone] option 4 or send [place] a message through [place].  ? ?We are unable to tell what your co-pay for medications will be in advance as this is different depending on your insurance coverage. However, we may be able to find a substitute medication at lower cost or fill out paperwork to get insurance to cover a needed medication.  ? ?If a prior authorization is required to get your medication covered by your insurance company, please allow [place] 1-2 business days to complete this process. ? ?Drug prices often vary depending on where the prescription is filled and some pharmacies may offer cheaper prices. ? ?The website www.goodrx.com contains coupons for medications through different pharmacies. The prices here do not account for what the cost may be with help from insurance (it may be cheaper with your insurance), but the website can give you the price if you did not use any insurance.  ?- You can print the associated coupon and take it with your prescription to the pharmacy.  ?- You may also stop by our office during regular business hours and pick up a GoodRx coupon card.  ?- If you need your prescription sent electronically to a different pharmacy, notify our office through [facility] or by phone at [phone] option 4. ? ? ? ? ?Si Usted Necesita Algo Despu?s de Su Visita ? ?Tambi?n  puede enviarnos un mensaje a trav?s de MyChart. Por lo general respondemos a los mensajes de MyChart en el transcurso de 1 a 2 d?as h?biles. ? ?Para renovar recetas, por favor pida a su farmacia que se ponga en contacto con nuestra oficina. Nuestro n?mero de fax es el [phone]. ? ?Si tiene un asunto urgente cuando la cl?nica est? cerrada y que no puede esperar hasta el siguiente d?a h?bil, puede llamar/localizar a su doctor(a) al n?mero que aparece a continuaci?n.  ? ?Por favor, tenga en cuenta que aunque hacemos todo lo posible para estar disponibles para asuntos urgentes fuera del horario de oficina, no estamos disponibles las 24 horas del d?a, los 7 d?as de la semana.  ? ?Si tiene un problema urgente y no puede comunicarse con nosotros, puede optar por buscar atenci?n m?dica  en el consultorio de su doctor(a), en una cl?nica privada, en un centro  de atenci?n urgente o en una sala de emergencias. ? ?Si tiene Engineer, maintenance (IT) m?dica, por favor llame inmediatamente al 911 o vaya a la sala de emergencias. ? ?N?meros de b?per ? ?- Dr. Nehemiah Massed: [phone] ? ?- Dra. Moye: [phone] ? ?- Dra. Nicole Kindred: [phone] ? ?En caso de inclemencias del tiempo, por favor llame a nuestra l?nea principal al [phone] para una actualizaci?n sobre el estado de cualquier retraso o cierre. ? ?Consejos para la medicaci?n en dermatolog?a: ?Por favor, guarde las cajas en las que vienen los medicamentos de uso t?pico para ayudarle a seguir las instrucciones sobre d?nde y c?mo usarlos. Las farmacias generalmente imprimen las instrucciones del medicamento s?lo en las cajas y no directamente en los tubos del [place].  ? ?Si su medicamento es muy caro, por favor, p?ngase en contacto con Zigmund Daniel llamando al [phone] y presione la opci?n 4 o env?enos un mensaje a trav?s de MyChart.  ? ?No podemos decirle cu?l ser? su copago por los medicamentos por adelantado ya que esto es diferente dependiendo de la cobertura de su  seguro. Sin embargo, es posible que podamos encontrar un medicamento sustituto a Electrical engineer un formulario para que el seguro cubra el medicamento que se considera necesario.  ? ?Si se requi

## 2021-08-08 ENCOUNTER — Telehealth: Payer: Self-pay

## 2021-08-08 NOTE — Telephone Encounter (Addendum)
Called and discussed biopsy results with patient. She verbalized understanding and denied further questions at this time. ----- Message from Brendolyn Patty, MD sent at 08/05/2021 11:35 AM EDT ----- ?1. Skin (M), left lower back ?SEBORRHEIC KERATOSIS, POLYPOID, IRRITATED ?2. Skin (M), left lower abdomen ?SEBORRHEIC KERATOSIS, INFLAMED ?3. Skin (M), left buttock ?FIBROLIPOMA, IRRITATED ? ?1 and 2. Benign irritated age-related growths (Sks) ?3. Benign irritated skin tag-like growth  ? ?- please call patient ?

## 2021-10-25 ENCOUNTER — Other Ambulatory Visit: Payer: Self-pay | Admitting: Internal Medicine

## 2021-10-25 DIAGNOSIS — Z1231 Encounter for screening mammogram for malignant neoplasm of breast: Secondary | ICD-10-CM

## 2021-12-09 ENCOUNTER — Ambulatory Visit
Admission: RE | Admit: 2021-12-09 | Discharge: 2021-12-09 | Disposition: A | Discharge status: Home / Self Care | Payer: Medicare PPO | Source: Ambulatory Visit | Attending: Internal Medicine | Admitting: Internal Medicine

## 2021-12-09 DIAGNOSIS — Z1231 Encounter for screening mammogram for malignant neoplasm of breast: Secondary | ICD-10-CM | POA: Diagnosis not present

## 2022-01-31 DIAGNOSIS — Z79899 Other long term (current) drug therapy: Secondary | ICD-10-CM | POA: Insufficient documentation

## 2022-01-31 DIAGNOSIS — M899 Disorder of bone, unspecified: Secondary | ICD-10-CM | POA: Insufficient documentation

## 2022-01-31 DIAGNOSIS — G894 Chronic pain syndrome: Secondary | ICD-10-CM | POA: Insufficient documentation

## 2022-01-31 DIAGNOSIS — Z789 Other specified health status: Secondary | ICD-10-CM | POA: Insufficient documentation

## 2022-01-31 NOTE — Progress Notes (Unsigned)
Patient: Shelley Mahoney  Service Category: E/M  Provider: Gaspar Cola, MD  DOB: 06-02-1931  DOS: 02/01/2022  Referring Provider: Doyle Askew, MD  MRN: [id number]  Setting: Ambulatory outpatient  PCP: Kirk Ruths, MD  Type: New Patient  Specialty: Interventional Pain Management    Location: Office  Delivery: Face-to-face     Primary Reason(s) for Visit: Encounter for initial evaluation of one or more chronic problems (new to examiner) potentially causing chronic pain, and posing a threat to normal musculoskeletal function. (Level of risk: High) CC: No chief complaint on file.  HPI  Shelley Mahoney is a 88 y.o. year old, female patient, who comes for the first time to our practice referred by Girtha Hake I, MD for our initial evaluation of her chronic pain. She has Mass of upper lobe of left lung; Primary cancer of left upper lobe of lung ([place]); Carotid artery disease (HCC); CKD (chronic kidney disease) stage 3, GFR 30-59 ml/min ([place]); Dysphonia of essential tremor; Hypothyroid; Iron deficiency; Malignant neoplasm of upper lobe of left lung ([place]); Neuropathy; Periodic limb movement sleep disorder; Primary insomnia; Restless leg syndrome; Vocal tremor; History of skin cancer; Lung cancer ([place]); Aortic atherosclerosis ([place]); Chronic bilateral low back pain with bilateral sciatica; Health care maintenance; Lumbar stenosis without neurogenic claudication; Prediabetes; Status post deep brain stimulator placement; Strain of right knee; Essential tremor; Chronic pain syndrome; Pharmacologic therapy; Disorder of skeletal system; and Problems influencing health status on their problem list. Today she comes in for evaluation of her No chief complaint on file.  Pain Assessment: Location:     Radiating:   Onset:   Duration:   Quality:   Severity:  /10 (subjective, self-reported pain score)  Effect on ADL:   Timing:   Modifying factors:   BP:    HR:    Onset and Duration: {Hx;  Onset and Duration:210120511} Cause of pain: {Hx; Cause:210120521} Severity: {Pain Severity:210120502} Timing: {Symptoms; Timing:210120501} Aggravating Factors: {Causes; Aggravating pain factors:210120507} Alleviating Factors: {Causes; Alleviating Factors:210120500} Associated Problems: {Hx; Associated problems:210120515} Quality of Pain: {Hx; Symptom quality or Descriptor:210120531} Previous Examinations or Tests: {Hx; Previous examinations or test:210120529} Previous Treatments: {Hx; Previous Treatment:210120503}  ***  Today I took the time to provide the patient with information regarding my pain practice. The patient was informed that my practice is divided into two sections: an interventional pain management section, as well as a completely separate and distinct medication management section. I explained that I have procedure days for my interventional therapies, and evaluation days for follow-ups and medication management. Because of the amount of documentation required during both, they are kept separated. This means that there is the possibility that she may be scheduled for a procedure on one day, and medication management the next. I have also informed her that because of staffing and facility limitations, I no longer take patients for medication management only. To illustrate the reasons for this, I gave the patient the example of surgeons, and how inappropriate it would be to refer a patient to his/her care, just to write for the post-surgical antibiotics on a surgery done by a different surgeon.   Because interventional pain management is my board-certified specialty, the patient was informed that joining my practice means that they are open to any and all interventional therapies. I made it clear that this does not mean that they will be forced to have any procedures done. What this means is that I believe interventional therapies to be essential part of the diagnosis  and proper  management of chronic pain conditions. Therefore, patients not interested in these interventional alternatives will be better served under the care of a different practitioner.  The patient was also made aware of my Comprehensive Pain Management Safety Guidelines where by joining my practice, they limit all of their nerve blocks and joint injections to those done by our practice, for as long as we are retained to manage their care.   Historic Controlled Substance Pharmacotherapy Review  PMP and historical list of controlled substances: ***  Current opioid analgesics:   *** MME/day: *** mg/day  Historical Monitoring: The patient  reports no history of drug use. List of prior UDS Testing: No results found for: "MDMA", "COCAINSCRNUR", "PCPSCRNUR", "PCPQUANT", "CANNABQUANT", "THCU", "ETH", "CBDTHCR", "D8THCCBX", "D9THCCBX" Historical Background Evaluation: [place] PMP: PDMP reviewed during this encounter. Review of the past 28-month conducted.             PMP NARX Score Report:  Narcotic: *** Sedative: *** Stimulant: *** [place] Department of public safety, offender search: (Editor, commissioningInformation) Non-contributory Risk Assessment Profile: Aberrant behavior: None observed or detected today Risk factors for fatal opioid overdose: None identified today PMP NARX Overdose Risk Score: *** Fatal overdose hazard ratio (HR): Calculation deferred Non-fatal overdose hazard ratio (HR): Calculation deferred Risk of opioid abuse or dependence: 0.7-3.0% with doses ? 36 MME/day and 6.1-26% with doses ? 120 MME/day. Substance use disorder (SUD) risk level: See below Personal History of Substance Abuse (SUD-Substance use disorder):  Alcohol:    Illegal Drugs:    Rx Drugs:    ORT Risk Level calculation:    ORT Scoring interpretation table:  Score <3 = Low Risk for SUD  Score between 4-7 = Moderate Risk for SUD  Score >8 = High Risk for Opioid Abuse   PHQ-2 Depression Scale:  Total score:    PHQ-2 Scoring  interpretation table: (Score and probability of major depressive disorder)  Score 0 = No depression  Score 1 = 15.4% Probability  Score 2 = 21.1% Probability  Score 3 = 38.4% Probability  Score 4 = 45.5% Probability  Score 5 = 56.4% Probability  Score 6 = 78.6% Probability   PHQ-9 Depression Scale:  Total score:    PHQ-9 Scoring interpretation table:  Score 0-4 = No depression  Score 5-9 = Mild depression  Score 10-14 = Moderate depression  Score 15-19 = Moderately severe depression  Score 20-27 = Severe depression (2.4 times higher risk of SUD and 2.89 times higher risk of overuse)   Pharmacologic Plan: As per protocol, I have not taken over any controlled substance management, pending the results of ordered tests and/or consults.            Initial impression: Pending review of available data and ordered tests.  Meds   Current Outpatient Medications:    ASPIRIN 81 PO, Take 81 mg by mouth daily., Disp: , Rfl:    cholecalciferol (VITAMIN D) 1000 units tablet, Take 1,000 Units by mouth daily., Disp: , Rfl:    gabapentin (NEURONTIN) 100 MG capsule, Take 100 mg by mouth 3 (three) times daily. 100 mg in the morning, 200 mg at noon, 300 mg at bedtime., Disp: , Rfl:    IRON CR PO, Take 45 mg by mouth daily., Disp: , Rfl:    levothyroxine (SYNTHROID) 88 MCG tablet, Take by mouth., Disp: , Rfl:    MAGNESIUM GLYCINATE PLUS PO, Take 400 mg by mouth 2 (two) times daily. , Disp: , Rfl:    Multiple  Vitamin (MULTIVITAMIN) tablet, Take 1 tablet by mouth daily., Disp: , Rfl:    Multiple Vitamins-Minerals (PRESERVISION AREDS) CAPS, Take by mouth., Disp: , Rfl:    pramipexole (MIRAPEX) 0.125 MG tablet, Take 0.25 mg by mouth at bedtime. , Disp: , Rfl:    primidone (MYSOLINE) 50 MG tablet, Take 50 mg by mouth 3 (three) times daily. 1 in am  2 at noon 2 at hs, Disp: , Rfl:    Probiotic Product ([place]), Take 1 tablet by mouth daily. , Disp: , Rfl:    simvastatin (ZOCOR) 40 MG tablet,  Take 40 mg by mouth at bedtime. , Disp: , Rfl:    zaleplon (SONATA) 10 MG capsule, , Disp: , Rfl:   Imaging Review  Cervical Imaging: Cervical MR wo contrast: No results found for this or any previous visit.  Cervical MR wo contrast: No valid procedures specified. Cervical MR w/wo contrast: No results found for this or any previous visit.  Cervical MR w contrast: No results found for this or any previous visit.  Cervical CT wo contrast: No results found for this or any previous visit.  Cervical CT w/wo contrast: No results found for this or any previous visit.  Cervical CT w/wo contrast: No results found for this or any previous visit.  Cervical CT w contrast: No results found for this or any previous visit.  Cervical CT outside: No results found for this or any previous visit.  Cervical DG 1 view: No results found for this or any previous visit.  Cervical DG 2-3 views: No results found for this or any previous visit.  Cervical DG F/E views: No results found for this or any previous visit.  Cervical DG 2-3 clearing views: No results found for this or any previous visit.  Cervical DG Bending/F/E views: No results found for this or any previous visit.  Cervical DG complete: No results found for this or any previous visit.  Cervical DG Myelogram views: No results found for this or any previous visit.  Cervical DG Myelogram views: No results found for this or any previous visit.  Cervical Discogram views: No results found for this or any previous visit.   Shoulder Imaging: Shoulder-R MR w contrast: No results found for this or any previous visit.  Shoulder-L MR w contrast: No results found for this or any previous visit.  Shoulder-R MR w/wo contrast: No results found for this or any previous visit.  Shoulder-L MR w/wo contrast: No results found for this or any previous visit.  Shoulder-R MR wo contrast: No results found for this or any previous visit.  Shoulder-L MR wo  contrast: No results found for this or any previous visit.  Shoulder-R CT w contrast: No results found for this or any previous visit.  Shoulder-L CT w contrast: No results found for this or any previous visit.  Shoulder-R CT w/wo contrast: No results found for this or any previous visit.  Shoulder-L CT w/wo contrast: No results found for this or any previous visit.  Shoulder-R CT wo contrast: No results found for this or any previous visit.  Shoulder-L CT wo contrast: No results found for this or any previous visit.  Shoulder-R DG Arthrogram: No results found for this or any previous visit.  Shoulder-L DG Arthrogram: No results found for this or any previous visit.  Shoulder-R DG 1 view: No results found for this or any previous visit.  Shoulder-L DG 1 view: No results found for this or any  previous visit.  Shoulder-R DG: No results found for this or any previous visit.  Shoulder-L DG: No results found for this or any previous visit.   Thoracic Imaging: Thoracic MR wo contrast: No results found for this or any previous visit.  Thoracic MR wo contrast: No valid procedures specified. Thoracic MR w/wo contrast: No results found for this or any previous visit.  Thoracic MR w contrast: No results found for this or any previous visit.  Thoracic CT wo contrast: No results found for this or any previous visit.  Thoracic CT w/wo contrast: No results found for this or any previous visit.  Thoracic CT w/wo contrast: No results found for this or any previous visit.  Thoracic CT w contrast: No results found for this or any previous visit.  Thoracic DG 2-3 views: No results found for this or any previous visit.  Thoracic DG 4 views: No results found for this or any previous visit.  Thoracic DG: No results found for this or any previous visit.  Thoracic DG w/swimmers view: No results found for this or any previous visit.  Thoracic DG Myelogram views: No results found for this or  any previous visit.  Thoracic DG Myelogram views: No results found for this or any previous visit.   Lumbosacral Imaging: Lumbar MR wo contrast: No results found for this or any previous visit.  Lumbar MR wo contrast: No valid procedures specified. Lumbar MR w/wo contrast: No results found for this or any previous visit.  Lumbar MR w/wo contrast: No results found for this or any previous visit.  Lumbar MR w contrast: No results found for this or any previous visit.  Lumbar CT wo contrast: No results found for this or any previous visit.  Lumbar CT w/wo contrast: No results found for this or any previous visit.  Lumbar CT w/wo contrast: No results found for this or any previous visit.  Lumbar CT w contrast: Results for orders placed during the hospital encounter of 10/28/19  CT LUMBAR SPINE W CONTRAST  Narrative CLINICAL DATA:  Chronic low back pain with bilateral radicular symptoms.  EXAM: CT MYELOGRAPHY LUMBAR SPINE  TECHNIQUE: CT imaging of the lumbar spine was performed after Isovue 200M contrast administration. Multiplanar CT image reconstructions were also generated.  COMPARISON:  Myelogram images same day.  FINDINGS: Segmentation:  5 lumbar type vertebral bodies.  Alignment: Lumbar curvature convex to the right with the apex at L3. Rightward translation of L3 relative to L4 of 9 mm. 3 mm degenerative anterolisthesis L5-S1.  Vertebrae: No fracture or primary bone lesion. Chronic endplate Schmorl's node at the inferior endplate of L1. Chronic degenerative sclerosis and cystic changes within the left side at L2-3 and L3-4.  Conus medullaris: Extends to the L1 level and appears normal.  Paraspinal and other soft tissues: Aortic atherosclerosis, minimal.  Disc levels:  T12-L1: Normal  L1-2: Minimal noncompressive disc bulge.  L2-3: Disc degeneration more pronounced on the left. Endplate osteophytes and bulging of the disc more pronounced on the  left. Facet and ligamentous hypertrophy worse on the left. Stenosis of the left lateral recess and intervertebral foramen on the left that could cause left-sided neural compression.  L3-4: Disc degeneration more pronounced on the left. Endplate osteophytes and bulging of the disc more pronounced on the left. Facet and ligamentous hypertrophy more pronounced on the left. Mild stenosis of both lateral recesses and neural foramina without definite compressive stenosis.  L4-5: Disc degeneration more pronounced on the right. Endplate osteophytes and  bulging of the disc. Facet and ligamentous hypertrophy right more than left. Stenosis the lateral recesses and foramina, right more than left.  L5-S1: Advanced facet osteoarthritis with 3 mm of anterolisthesis. Degeneration and bulging of the disc. Stenosis of the subarticular lateral recesses. Either S1 nerve could be affected. There is bilateral foraminal stenosis right worse than left.  IMPRESSION: L5-S1: Advanced facet arthropathy right worse than left with 3 mm of anterolisthesis. Degeneration and bulging of the disc. Stenosis of the subarticular lateral recesses . Either S1 nerve could be affected. There is also foraminal stenosis right worse than left that could effect in particular the right L5 nerve.  L4-5: Facet degeneration and disc degeneration. Mild stenosis of both lateral recesses and neural foramina, right more than left.  L3-4: Bilateral lateral recess and foraminal narrowing without definite compressive stenosis.  L2-3: Lateral recess and foraminal stenosis left worse than right could cause left-sided neural compression.  L1-2: Mild left lateral recess and foraminal narrowing, not grossly compressive.   Electronically Signed By: Nelson Chimes M.D. On: 10/28/2019 20:21  Lumbar DG 1V: No results found for this or any previous visit.  Lumbar DG 1V (Clearing): No results found for this or any previous  visit.  Lumbar DG 2-3V (Clearing): No results found for this or any previous visit.  Lumbar DG 2-3 views: No results found for this or any previous visit.  Lumbar DG (Complete) 4+V: No results found for this or any previous visit.        Lumbar DG F/E views: No results found for this or any previous visit.        Lumbar DG Bending views: No results found for this or any previous visit.        Lumbar DG Myelogram views: Results for orders placed during the hospital encounter of 10/28/19  DG Myelogram Lumbar  Narrative CLINICAL DATA:  Chronic low back pain with bilateral sciatica  EXAM: LUMBAR MYELOGRAM  FLUOROSCOPY TIME:  3 minutes 24 seconds  PROCEDURE: After thorough discussion of risks and benefits of the procedure including bleeding, infection, injury to nerves, blood vessels, adjacent structures as well as headache and CSF leak, written and oral informed consent was obtained. Consent was obtained by Dr. Inez Catalina. Time out form was completed.  Patient was positioned prone on the fluoroscopy table. Local anesthesia was provided with 1% lidocaine without epinephrine after prepped and draped in the usual sterile fashion. Puncture was performed at L4 using a 3 1/2 inch 22-gauge spinal needle via paramedian approach. Using a single pass through the dura, the needle was placed within the thecal sac, with return of clear CSF. 15 mL of Omnipaque 180 was injected into the thecal sac, with normal opacification of the nerve roots and cauda equina consistent with free flow within the subarachnoid space.  I personally performed the lumbar puncture and administered the intrathecal contrast. I also personally supervised acquisition of the myelogram images.  COMPARISON:  None  FINDINGS: LUMBAR MYELOGRAM FINDINGS:  There is free flow contrast material into the lumbar thecal sac. Mild extrinsic compression upon the L5 nerve root in the thecal sac is noted at L4-5 particularly  on the right. Some generalized narrowing of the sac at L5-S1 is seen. Contrast material flowed superiorly towards the L3 vertebral body although would not demonstrates significant opacification beyond L3 suggestive of a block. Patient was rotated to allow for improved distribution of contrast although no significant contrast was noted above L3. scoliosis concave to the left is  noted centered at L3.  IMPRESSION: LUMBAR MYELOGRAM IMPRESSION:  Scoliosis centered at L3 concave to the left.  Focal extrinsic compression on the thecal sac at L4-5 is noted with compression of the L5 nerve root on the right.  Changes suggestive of a block at L3. This would be better evaluated on upcoming CT examination. This may simply be related to patient positioning.   Electronically Signed By: Inez Catalina M.D. On: 10/28/2019 09:09  Lumbar DG Myelogram: No results found for this or any previous visit.  Lumbar DG Myelogram: No results found for this or any previous visit.  Lumbar DG Myelogram: No results found for this or any previous visit.  Lumbar DG Myelogram Lumbosacral: No results found for this or any previous visit.  Lumbar DG Diskogram views: No results found for this or any previous visit.  Lumbar DG Diskogram views: No results found for this or any previous visit.  Lumbar DG Epidurogram OP: No results found for this or any previous visit.  Lumbar DG Epidurogram IP: No valid procedures specified.  Sacroiliac Joint Imaging: Sacroiliac Joint DG: No results found for this or any previous visit.  Sacroiliac Joint MR w/wo contrast: No results found for this or any previous visit.  Sacroiliac Joint MR wo contrast: No results found for this or any previous visit.   Spine Imaging: Whole Spine DG Myelogram views: No results found for this or any previous visit.  Whole Spine MR Mets screen: No results found for this or any previous visit.  Whole Spine MR Mets screen: No results found  for this or any previous visit.  Whole Spine MR w/wo: No results found for this or any previous visit.  MRA Spinal Canal w/ cm: No results found for this or any previous visit.  MRA Spinal Canal wo/ cm: No valid procedures specified. MRA Spinal Canal w/wo cm: No results found for this or any previous visit.  Spine Outside MR Films: No results found for this or any previous visit.  Spine Outside CT Films: No results found for this or any previous visit.  CT-Guided Biopsy: No results found for this or any previous visit.  CT-Guided Needle Placement: No results found for this or any previous visit.  DG Spine outside: No results found for this or any previous visit.  IR Spine outside: No results found for this or any previous visit.  NM Spine outside: No results found for this or any previous visit.   Hip Imaging: Hip-R MR w contrast: No results found for this or any previous visit.  Hip-L MR w contrast: No results found for this or any previous visit.  Hip-R MR w/wo contrast: No results found for this or any previous visit.  Hip-L MR w/wo contrast: No results found for this or any previous visit.  Hip-R MR wo contrast: No results found for this or any previous visit.  Hip-L MR wo contrast: No results found for this or any previous visit.  Hip-R CT w contrast: No results found for this or any previous visit.  Hip-L CT w contrast: No results found for this or any previous visit.  Hip-R CT w/wo contrast: No results found for this or any previous visit.  Hip-L CT w/wo contrast: No results found for this or any previous visit.  Hip-R CT wo contrast: No results found for this or any previous visit.  Hip-L CT wo contrast: No results found for this or any previous visit.  Hip-R DG 2-3 views: No results found  for this or any previous visit.  Hip-L DG 2-3 views: No results found for this or any previous visit.  Hip-R DG Arthrogram: No results found for this or any previous  visit.  Hip-L DG Arthrogram: No results found for this or any previous visit.  Hip-B DG Bilateral: No results found for this or any previous visit.   Knee Imaging: Knee-R MR w contrast: No results found for this or any previous visit.  Knee-L MR w/o contrast: No results found for this or any previous visit.  Knee-R MR w/wo contrast: No results found for this or any previous visit.  Knee-L MR w/wo contrast: No results found for this or any previous visit.  Knee-R MR wo contrast: No results found for this or any previous visit.  Knee-L MR wo contrast: No results found for this or any previous visit.  Knee-R CT w contrast: No results found for this or any previous visit.  Knee-L CT w contrast: No results found for this or any previous visit.  Knee-R CT w/wo contrast: No results found for this or any previous visit.  Knee-L CT w/wo contrast: No results found for this or any previous visit.  Knee-R CT wo contrast: No results found for this or any previous visit.  Knee-L CT wo contrast: No results found for this or any previous visit.  Knee-R DG 1-2 views: No results found for this or any previous visit.  Knee-L DG 1-2 views: No results found for this or any previous visit.  Knee-R DG 3 views: No results found for this or any previous visit.  Knee-L DG 3 views: No results found for this or any previous visit.  Knee-R DG 4 views: No results found for this or any previous visit.  Knee-L DG 4 views: No results found for this or any previous visit.  Knee-R DG Arthrogram: No results found for this or any previous visit.  Knee-L DG Arthrogram: No results found for this or any previous visit.   Ankle Imaging: Ankle-R DG Complete: No results found for this or any previous visit.  Ankle-L DG Complete: No results found for this or any previous visit.   Foot Imaging: Foot-R DG Complete: No results found for this or any previous visit.  Foot-L DG Complete: No results found for  this or any previous visit.   Elbow Imaging: Elbow-R DG Complete: No results found for this or any previous visit.  Elbow-L DG Complete: No results found for this or any previous visit.   Wrist Imaging: Wrist-R DG Complete: No results found for this or any previous visit.  Wrist-L DG Complete: No results found for this or any previous visit.   Hand Imaging: Hand-R DG Complete: No results found for this or any previous visit.  Hand-L DG Complete: No results found for this or any previous visit.   Complexity Note: Imaging results reviewed.                         ROS  Cardiovascular: {Hx; Cardiovascular History:210120525} Pulmonary or Respiratory: {Hx; Pumonary and/or Respiratory History:210120523} Neurological: {Hx; Neurological:210120504} Psychological-Psychiatric: {Hx; Psychological-Psychiatric History:210120512} Gastrointestinal: {Hx; Gastrointestinal:210120527} Genitourinary: {Hx; Genitourinary:210120506} Hematological: {Hx; Hematological:210120510} Endocrine: {Hx; Endocrine history:210120509} Rheumatologic: {Hx; Rheumatological:210120530} Musculoskeletal: {Hx; Musculoskeletal:210120528} Work History: {Hx; Work history:[id number]}  Allergies  Ms. Shostak has No Known Allergies.  Laboratory Chemistry Profile   Renal Lab Results  Component Value Date   BUN 29 (H) 04/09/2021   CREATININE 1.00 05/30/2021   GFRAA >60  04/28/2016   GFRNONAA 47 (L) 04/09/2021     Electrolytes Lab Results  Component Value Date   NA 137 04/09/2021   K 3.8 04/09/2021   CL 105 04/09/2021   CALCIUM 8.0 (L) 04/09/2021     Hepatic Lab Results  Component Value Date   AST 29 04/09/2021   ALT 23 04/09/2021   ALBUMIN 3.9 04/09/2021   ALKPHOS 44 04/09/2021   LIPASE 41 04/09/2021     ID Lab Results  Component Value Date   SARSCOV2NAA NEGATIVE 04/09/2021     Bone No results found for: "VD25OH", "VD125OH2TOT", "EX9371IR6", "VE9381OF7", "25OHVITD1", "25OHVITD2", "25OHVITD3",  "TESTOFREE", "TESTOSTERONE"   Endocrine Lab Results  Component Value Date   GLUCOSE 136 (H) 04/09/2021     Neuropathy No results found for: "VITAMINB12", "FOLATE", "HGBA1C", "HIV"   CNS No results found for: "COLORCSF", "APPEARCSF", "RBCCOUNTCSF", "WBCCSF", "POLYSCSF", "LYMPHSCSF", "EOSCSF", "PROTEINCSF", "GLUCCSF", "JCVIRUS", "CSFOLI", "IGGCSF", "LABACHR", "ACETBL"   Inflammation (CRP: Acute  ESR: Chronic) No results found for: "CRP", "ESRSEDRATE", "LATICACIDVEN"   Rheumatology No results found for: "RF", "ANA", "LABURIC", "URICUR", "LYMEIGGIGMAB", "LYMEABIGMQN", "HLAB27"   Coagulation Lab Results  Component Value Date   INR 1.1 10/28/2019   LABPROT 13.7 10/28/2019   APTT 38 (H) 10/28/2019   PLT 170 04/09/2021     Cardiovascular Lab Results  Component Value Date   HGB 14.0 04/09/2021   HCT 42.7 04/09/2021     Screening Lab Results  Component Value Date   SARSCOV2NAA NEGATIVE 04/09/2021     Cancer No results found for: "CEA", "CA125", "LABCA2"   Allergens No results found for: "ALMOND", "APPLE", "ASPARAGUS", "AVOCADO", "BANANA", "BARLEY", "BASIL", "BAYLEAF", "GREENBEAN", "LIMABEAN", "WHITEBEAN", "BEEFIGE", "REDBEET", "BLUEBERRY", "BROCCOLI", "CABBAGE", "MELON", "CARROT", "CASEIN", "CASHEWNUT", "CAULIFLOWER", "CELERY"     Note: Lab results reviewed.  PFSH  Drug: Ms. Pieratt  reports no history of drug use. Alcohol:  reports no history of alcohol use. Tobacco:  reports that she has never smoked. She has never used smokeless tobacco. Medical:  has a past medical history of Carotid stenosis, Carotid stenosis, Chronic kidney disease, CKD (chronic kidney disease), Dysplastic nevus (09/22/2019), Hypothyroidism, Lung cancer ([place]) (2018), Neuro-degenerative disorders ([place]), Personal history of radiation therapy, and Uterine cancer ([place]). Family: family history includes Breast cancer in her mother; Colon cancer in her maternal grandmother; Other in her father.  Past  Surgical History:  Procedure Laterality Date   ABDOMINAL HYSTERECTOMY     Total-does not have ovaries   COLONOSCOPY WITH PROPOFOL N/A 12/08/2014   Procedure: COLONOSCOPY WITH PROPOFOL;  Surgeon: Lollie Sails, MD;  Location: [facility] ENDOSCOPY;  Service: Endoscopy;  Laterality: N/A;   EYE SURGERY     cataracts ou   FLEXIBLE BRONCHOSCOPY N/A 04/21/2016   Procedure: FLEXIBLE BRONCHOSCOPY;  Surgeon: Wilhelmina Mcardle, MD;  Location: [facility] ORS;  Service: Pulmonary;  Laterality: N/A;   THYROID SURGERY     Active Ambulatory Problems    Diagnosis Date Noted   Mass of upper lobe of left lung 04/11/2016   Primary cancer of left upper lobe of lung ([place]) 04/28/2016   Carotid artery disease ([place]) 11/02/2013   CKD (chronic kidney disease) stage 3, GFR 30-59 ml/min (HCC) 11/18/2013   Dysphonia of essential tremor 05/26/2016   Hypothyroid 11/02/2013   Iron deficiency 04/20/2016   Malignant neoplasm of upper lobe of left lung ([place]) 05/24/2016   Neuropathy 11/02/2013   Periodic limb movement sleep disorder 04/20/2016   Primary insomnia 12/01/2014   Restless leg syndrome 01/13/2016   Vocal tremor 01/21/2013  History of skin cancer 08/04/2019   Lung cancer ([place]) 12/08/2020   Aortic atherosclerosis ([place]) 07/29/2018   Chronic bilateral low back pain with bilateral sciatica 11/07/2019   Health care maintenance 02/08/2016   Lumbar stenosis without neurogenic claudication 11/07/2019   Prediabetes 07/29/2019   Status post deep brain stimulator placement 01/29/2020   Strain of right knee 10/25/2020   Essential tremor 11/02/2013   Chronic pain syndrome 01/31/2022   Pharmacologic therapy 01/31/2022   Disorder of skeletal system 01/31/2022   Problems influencing health status 01/31/2022   Resolved Ambulatory Problems    Diagnosis Date Noted   No Resolved Ambulatory Problems   Past Medical History:  Diagnosis Date   Carotid stenosis    Carotid stenosis    Chronic kidney disease    CKD (chronic  kidney disease)    Dysplastic nevus 09/22/2019   Hypothyroidism    Neuro-degenerative disorders ([place])    Personal history of radiation therapy    Uterine cancer ([place])    Constitutional Exam  General appearance: Well nourished, well developed, and well hydrated. In no apparent acute distress There were no vitals filed for this visit. BMI Assessment: Estimated body mass index is 26.67 kg/m as calculated from the following:   Height as of 04/09/21: '5\' 9"'  (1.753 m).   Weight as of 06/02/21: 180 lb 9.6 oz (81.9 kg).  BMI interpretation table: BMI level Category Range association with higher incidence of chronic pain  <18 kg/m2 Underweight   18.5-24.9 kg/m2 Ideal body weight   25-29.9 kg/m2 Overweight Increased incidence by 20%  30-34.9 kg/m2 Obese (Class I) Increased incidence by 68%  35-39.9 kg/m2 Severe obesity (Class II) Increased incidence by 136%  >40 kg/m2 Extreme obesity (Class III) Increased incidence by 254%   Patient's current BMI Ideal Body weight  There is no height or weight on file to calculate BMI. Patient weight not recorded   BMI Readings from Last 4 Encounters:  06/02/21 26.67 kg/m  04/09/21 25.84 kg/m  12/01/20 26.71 kg/m  12/04/19 26.29 kg/m   Wt Readings from Last 4 Encounters:  06/02/21 180 lb 9.6 oz (81.9 kg)  04/09/21 175 lb (79.4 kg)  12/01/20 180 lb 14.4 oz (82.1 kg)  12/04/19 178 lb (80.7 kg)    Psych/Mental status: Alert, oriented x 3 (person, place, & time)       Eyes: PERLA Respiratory: No evidence of acute respiratory distress  Assessment  Primary Diagnosis & Pertinent Problem List: The primary encounter diagnosis was Chronic pain syndrome. Diagnoses of Pharmacologic therapy, Disorder of skeletal system, and Problems influencing health status were also pertinent to this visit.  Visit Diagnosis (New problems to examiner): 1. Chronic pain syndrome   2. Pharmacologic therapy   3. Disorder of skeletal system   4. Problems influencing health  status    Plan of Care (Initial workup plan)  Note: Ms. Diefendorf was reminded that as per protocol, today's visit has been an evaluation only. We have not taken over the patient's controlled substance management.  Problem-specific plan: No problem-specific Assessment & Plan notes found for this encounter.  Lab Orders  No laboratory test(s) ordered today   Imaging Orders  No imaging studies ordered today   Referral Orders  No referral(s) requested today   Procedure Orders    No procedure(s) ordered today   Pharmacotherapy (current): Medications ordered:  No orders of the defined types were placed in this encounter.  Medications administered during this visit: Cecille Amsterdam. Shimizu had no medications administered during this  visit.   Pharmacological management options:  Opioid Analgesics: The patient was informed that there is no guarantee that she would be a candidate for opioid analgesics. The decision will be made following CDC guidelines. This decision will be based on the results of diagnostic studies, as well as Ms. Sanna's risk profile.   Membrane stabilizer: To be determined at a later time  Muscle relaxant: To be determined at a later time  NSAID: To be determined at a later time  Other analgesic(s): To be determined at a later time   Interventional management options: Ms. Thane was informed that there is no guarantee that she would be a candidate for interventional therapies. The decision will be based on the results of diagnostic studies, as well as Ms. Standre's risk profile.  Procedure(s) under consideration:  Pending results of ordered studies      Interventional Therapies  Risk  Complexity Considerations:   Estimated body mass index is 26.67 kg/m as calculated from the following:   Height as of 04/09/21: '5\' 9"'  (1.753 m).   Weight as of 06/02/21: 180 lb 9.6 oz (81.9 kg). WNL   Planned  Pending:   Pending further evaluation   Under consideration:   ***    Completed:   None at this time   Completed by other providers:   None at this time   Therapeutic  Palliative (PRN) options:   None established      Provider-requested follow-up: No follow-ups on file.  Future Appointments  Date Time Provider [place]  02/01/2022  2:00 PM Milinda Pointer, MD [facility] None  08/08/2022  1:30 PM Brendolyn Patty, MD [facility] None    Note by: Gaspar Cola, MD Date: 02/01/2022; Time: 5:01 PM

## 2022-02-01 ENCOUNTER — Encounter: Payer: Self-pay | Admitting: Pain Medicine

## 2022-02-01 ENCOUNTER — Ambulatory Visit: Payer: Medicare PPO | Attending: Pain Medicine | Admitting: Pain Medicine

## 2022-02-01 VITALS — BP 132/73 | HR 77 | Temp 97.3°F | Resp 14 | Ht 67.0 in | Wt 180.0 lb

## 2022-02-01 DIAGNOSIS — G894 Chronic pain syndrome: Secondary | ICD-10-CM | POA: Diagnosis present

## 2022-02-01 DIAGNOSIS — M47817 Spondylosis without myelopathy or radiculopathy, lumbosacral region: Secondary | ICD-10-CM | POA: Diagnosis present

## 2022-02-01 DIAGNOSIS — M47816 Spondylosis without myelopathy or radiculopathy, lumbar region: Secondary | ICD-10-CM

## 2022-02-01 DIAGNOSIS — M4156 Other secondary scoliosis, lumbar region: Secondary | ICD-10-CM

## 2022-02-01 DIAGNOSIS — M4316 Spondylolisthesis, lumbar region: Secondary | ICD-10-CM | POA: Diagnosis present

## 2022-02-01 DIAGNOSIS — Z79899 Other long term (current) drug therapy: Secondary | ICD-10-CM | POA: Diagnosis present

## 2022-02-01 DIAGNOSIS — M899 Disorder of bone, unspecified: Secondary | ICD-10-CM | POA: Diagnosis present

## 2022-02-01 DIAGNOSIS — Z789 Other specified health status: Secondary | ICD-10-CM

## 2022-02-01 DIAGNOSIS — M5136 Other intervertebral disc degeneration, lumbar region: Secondary | ICD-10-CM | POA: Insufficient documentation

## 2022-02-01 DIAGNOSIS — M4317 Spondylolisthesis, lumbosacral region: Secondary | ICD-10-CM | POA: Diagnosis present

## 2022-02-01 DIAGNOSIS — R937 Abnormal findings on diagnostic imaging of other parts of musculoskeletal system: Secondary | ICD-10-CM | POA: Insufficient documentation

## 2022-02-01 DIAGNOSIS — M545 Low back pain, unspecified: Secondary | ICD-10-CM | POA: Diagnosis present

## 2022-02-01 DIAGNOSIS — M4807 Spinal stenosis, lumbosacral region: Secondary | ICD-10-CM

## 2022-02-01 DIAGNOSIS — M2428 Disorder of ligament, vertebrae: Secondary | ICD-10-CM | POA: Diagnosis present

## 2022-02-01 DIAGNOSIS — M549 Dorsalgia, unspecified: Secondary | ICD-10-CM | POA: Diagnosis present

## 2022-02-01 DIAGNOSIS — G8929 Other chronic pain: Secondary | ICD-10-CM | POA: Diagnosis present

## 2022-02-01 DIAGNOSIS — G629 Polyneuropathy, unspecified: Secondary | ICD-10-CM | POA: Diagnosis present

## 2022-02-01 DIAGNOSIS — M51369 Other intervertebral disc degeneration, lumbar region without mention of lumbar back pain or lower extremity pain: Secondary | ICD-10-CM

## 2022-02-06 LAB — COMPLIANCE DRUG ANALYSIS, UR

## 2022-02-07 ENCOUNTER — Other Ambulatory Visit: Payer: Self-pay | Admitting: Pain Medicine

## 2022-02-07 ENCOUNTER — Ambulatory Visit
Admission: RE | Admit: 2022-02-07 | Discharge: 2022-02-07 | Disposition: A | Discharge status: Home / Self Care | Payer: Medicare PPO | Source: Ambulatory Visit | Attending: Pain Medicine | Admitting: Pain Medicine

## 2022-02-07 ENCOUNTER — Ambulatory Visit
Admission: RE | Admit: 2022-02-07 | Discharge: 2022-02-07 | Disposition: A | Discharge status: Home / Self Care | Payer: Medicare PPO | Attending: Pain Medicine | Admitting: Pain Medicine

## 2022-02-07 DIAGNOSIS — R937 Abnormal findings on diagnostic imaging of other parts of musculoskeletal system: Secondary | ICD-10-CM | POA: Diagnosis not present

## 2022-02-07 DIAGNOSIS — G8929 Other chronic pain: Secondary | ICD-10-CM

## 2022-02-07 DIAGNOSIS — M545 Low back pain, unspecified: Secondary | ICD-10-CM | POA: Insufficient documentation

## 2022-02-07 DIAGNOSIS — M4317 Spondylolisthesis, lumbosacral region: Secondary | ICD-10-CM

## 2022-02-07 DIAGNOSIS — M549 Dorsalgia, unspecified: Secondary | ICD-10-CM | POA: Insufficient documentation

## 2022-02-07 LAB — 25-HYDROXY VITAMIN D LCMS D2+D3
25-Hydroxy, Vitamin D-2: <1 ng/mL
25-Hydroxy, Vitamin D-3: 47 ng/mL
25-Hydroxy, Vitamin D: 47 ng/mL

## 2022-02-07 LAB — COMP. METABOLIC PANEL (12)
AST: 21 IU/L (ref 0–40)
Albumin/Globulin Ratio: 1.5 (ref 1.2–2.2)
Albumin: 4.4 g/dL (ref 3.7–4.7)
Alkaline Phosphatase: 74 IU/L (ref 44–121)
BUN/Creatinine Ratio: 23 (ref 12–28)
BUN: 23 mg/dL (ref 8–27)
Bilirubin Total: 0.3 mg/dL (ref 0.0–1.2)
Calcium: 8.9 mg/dL (ref 8.7–10.3)
Chloride: 102 mmol/L (ref 96–106)
Creatinine, Ser: 1 mg/dL (ref 0.57–1.00)
Globulin, Total: 3 g/dL (ref 1.5–4.5)
Glucose: 91 mg/dL (ref 70–99)
Potassium: 4.7 mmol/L (ref 3.5–5.2)
Sodium: 138 mmol/L (ref 134–144)
Total Protein: 7.4 g/dL (ref 6.0–8.5)
eGFR: 54 mL/min/{1.73_m2} — ABNORMAL LOW (ref >59)

## 2022-02-07 LAB — C-REACTIVE PROTEIN: CRP: 1 mg/L (ref 0–10)

## 2022-02-07 LAB — SEDIMENTATION RATE: Sed Rate: 20 mm/hr (ref 0–40)

## 2022-02-07 LAB — VITAMIN B12: Vitamin B-12: 648 pg/mL (ref 232–1245)

## 2022-02-07 LAB — MAGNESIUM: Magnesium: 2.6 mg/dL — ABNORMAL HIGH (ref 1.6–2.3)

## 2022-03-13 ENCOUNTER — Ambulatory Visit: Payer: Medicare PPO | Admitting: Pain Medicine

## 2022-03-14 NOTE — Progress Notes (Deleted)
PROVIDER NOTE: Information contained herein reflects review and annotations entered in association with encounter. Interpretation of such information and data should be left to medically-trained personnel. Information provided to patient can be located elsewhere in the medical record under "Patient Instructions". Document created using STT-dictation technology, any transcriptional errors that may result from process are unintentional.    Patient: Shelley Mahoney  Service Category: E/M  Provider: Gaspar Cola, MD  DOB: [birth year]  DOS: 03/15/2022  Referring Provider: Kirk Ruths, MD  MRN: [id number]  Specialty: Interventional Pain Management  PCP: Kirk Ruths, MD  Type: Established Patient  Setting: Ambulatory outpatient    Location: Office  Delivery: Face-to-face     Primary Reason(s) for Visit: Encounter for evaluation before starting new chronic pain management plan of care (Level of risk: moderate) CC: No chief complaint on file.  HPI  Shelley Mahoney is a 88 y.o. year old, female patient, who comes today for a follow-up evaluation to review the test results and decide on a treatment plan. She has Mass of upper lobe of left lung; Primary cancer of left upper lobe of lung ([place]); Carotid artery disease (HCC); CKD (chronic kidney disease) stage 3, GFR 30-59 ml/min ([place]); Dysphonia of essential tremor; Hypothyroid; Iron deficiency; Malignant neoplasm of upper lobe of left lung ([place]); Neuropathy; Periodic limb movement sleep disorder; Primary insomnia; Restless legs syndrome; Vocal tremor; History of skin cancer; Lung cancer ([place]); Aortic atherosclerosis ([place]); Chronic low back pain (1ry area of Pain) (Bilateral) (R=L) w/o sciatica; Health care maintenance; Lumbar stenosis without neurogenic claudication; Prediabetes; Status post deep brain stimulator placement; Strain of right knee; Essential tremor; Chronic pain syndrome; Pharmacologic therapy; Disorder of skeletal system; Problems  influencing health status; Abnormal CT scan, lumbar spine (10/28/2019); Grade 1 Anterolisthesis of lumbosacral spine (L5/S1); Chronic mid back pain; Lumbar scoliosis due to degenerative disease of spine in adult; Lumbar spondylolisthesis (L5-S1 3 mm Anterolisthesis) (Right: L3-4 9 mm translation); Lumbar facet hypertrophy (Multilevel) (Bilateral); Lumbar facet joint osteoarthritis; DDD (degenerative disc disease), lumbar; DJD (degenerative joint disease), lumbosacral; Ligamentum flavum hypertrophy (L2-3, L3-4, L4-5); Lumbosacral foraminal stenosis (Left: L2-3) (Bilateral: L3-4) (L>R: L4-5, L5-S1); and Lumbosacral lateral recess stenosis (Left: L2-3) (Bilateral: L3-4, L4-5, L5-S1) on their problem list. Her primarily concern today is the No chief complaint on file.  Pain Assessment: Location:     Radiating:   Onset:   Duration:   Quality:   Severity:  /10 (subjective, self-reported pain score)  Effect on ADL:   Timing:   Modifying factors:   BP:    HR:    Shelley Mahoney comes in today for a follow-up visit after her initial evaluation on 02/01/2022. Today we went over the results of her tests. These were explained in "Layman's terms". During today's appointment we went over my diagnostic impression, as well as the proposed treatment plan.  Review of initial evaluation (02/01/2022): "The patient indicates her primary pain to be that of the lower back (Bilateral) (R=L).  She denies any prior back surgeries but she does indicate doing some physical therapy for this pain and having had a recent CT scan of the lumbar spine.  She also refers having had multiple nerve blocks in the lumbar spine.  According to my review of the records, the patient has had those blocks done at the [facility] department.  The patient indicates that the pain is not referred anywhere else.  She denies any lower extremity pain, numbness, or weakness.  In fact, the patient refers  never having had any symptoms in the lower  extremity.  Although the patient is 88 years old, she seems to have no cognitive impairments.  She seems to be having very good memory and answers appropriately to all of my questions.  I mention this because according to the medical records her diagnosis was that of chronic bilateral low back pain with bilateral sciatica, but I was unable to find any evidence of the sciatica.  Initially I would have thought that the patient had some radicular symptoms since between 11/20/2019 and 04/08/2021 the patient had 5 different treatments involving bilateral S1 transforaminal epidural steroid injections done by Dr. Girtha Hake.  The patient indicated that none of those did provide her with any significant benefit.  Subsequently she had 2 diagnostic bilateral L3, L4, and L5 medial branch blocks where the patient indicated having attained some short-term relief, but not complete.  She also indicated no long-term benefit from those.  Because the L3, L4, and L5 medial branches were blocked, this effectively blocked the L4-5 facet joint while partially blocking the lower portion of the L3-4 facet joint capsule and also partially blocking the L5-S1 facet joint.  A recent CT of the lumbar spine done on 10/28/2019 demonstrates the patient to be having Lumbar facet disease affecting the L2-3, L3-4, L4-5, and L5-S1 facet joint levels.  Interventional therapies: Diagnostic/therapeutic bilateral S1 TFESI x5 (11/20/2019, 12/24/2019, 04/30/2020, 06/30/2020, 04/08/2021) by Girtha Hake, MD [facility])  Diagnostic bilateral L3, L4, and L5 MBB x2 (05/26/2021, 06/10/2021) by Girtha Hake, MD [facility]R)  Therapeutic bilateral L3, L4, and L5 MB RFA x1 (10/12/2021) by Sharlet Salina, DO [facility] PMR)   The patient's secondary area pain is that of her feet (Bilateral).  She indicates having neuropathy of both of her feet which according to her was diagnosed by a neurologist at [facility].  The patient denies ever having had a nerve conduction test  done.  The patient denies having any diabetes.  The patient was told that she was being referred to [place] for possible spinal cord stimulator evaluation.  As part of our work-up, today we have ordered lab work, x-rays of the lumbar and thoracic spine to evaluate the patient's degenerative spondylolisthesis.  We will be ordering flexion-extension x-rays to evaluate for instability.  In addition, we will be ordering an EMG/PNCV of the lower extremities to evaluate for peripheral neuropathy versus radiculopathy.  I have also provided the patient with information regarding spinal cord stimulator implants.  I will see her back once she has completed the above testing at which time I will go over those results and determine what we can offer her.  I will also try to answer all of her questions regarding the implant and if at the time she decides that she is interested in the implant, then we will be sending her for the medical psychology evaluation for the implant.  The plan was shared with the patient who understood and accepted.  Aside from the above, the patient also has a history of a deep brain stimulator implant done [facility] by Dr. Radford Pax.  She describes that this was implanted approximately 4 to 5 years ago.  She also indicates that he was implanted for the treatment of central tremors.  She indicates that it has not only helped with tremors, but it also helped with her voice trembling.  The patient has a history of bilateral lower extremity jerks that normally occur at bedtime.  These have been described before as  restless leg syndrome.  Because of the patient's deep brain stimulator, she cannot have an MRI done.  However the implant card indicates that it is an MRI conditional implant indicating that she had an MRI be done, he should be using a 1.5 TELSA MRI, or less.  The patient also refers having stage III chronic kidney disease and for this reason she is unable to take any NSAIDs.  She does take Tylenol ES  2 tablets 3 times daily (3000 mg/day)."  ***  Because interventional pain management is my board-certified specialty, the patient was informed that joining my practice means that he is open to any and all interventional therapies. I made it clear that this does not mean that they will be forced to have any procedures done. What this means is that I believe interventional therapies to be essential part of the diagnosis and proper management of chronic pain conditions. Therefore, patients not interested in these interventional alternatives will be better served under the care of a different practitioner.  In considering the treatment plan options, Ms. Huizar was reminded that I no longer take patients for medication management only. I asked her to let me know if she had no intention of taking advantage of the interventional therapies, so that we could make arrangements to provide this space to someone interested. I also made it clear that undergoing interventional therapies for the purpose of getting pain medications is very inappropriate on the part of a patient, and it will not be tolerated in this practice. This type of behavior would suggest true addiction and therefore it requires referral to an addiction specialist.   Further details on both, my assessment(s), as well as the proposed treatment plan, please see below.  Controlled Substance Pharmacotherapy Assessment REMS (Risk Evaluation and Mitigation Strategy)  Opioid Analgesic: None MME/day: 0 mg/day  Pill Count: None expected due to no prior prescriptions written by our practice. No notes on file Pharmacokinetics: Liberation and absorption (onset of action): WNL Distribution (time to peak effect): WNL Metabolism and excretion (duration of action): WNL         Pharmacodynamics: Desired effects: Analgesia: Ms. Spadoni reports >50% benefit. Functional ability: Patient reports that medication allows her to accomplish basic ADLs Clinically  meaningful improvement in function (CMIF): Sustained CMIF goals met Perceived effectiveness: Described as relatively effective, allowing for increase in activities of daily living (ADL) Undesirable effects: Side-effects or Adverse reactions: None reported Monitoring: [place] PMP: PDMP reviewed during this encounter. Online review of the past 78-monthperiod previously conducted. Not applicable at this point since we have not taken over the patient's medication management yet. List of other Serum/Urine Drug Screening Test(s):  No results found for: "AMPHSCRSER", "BARBSCRSER", "BENZOSCRSER", "COCAINSCRSER", "COCAINSCRNUR", "PCPSCRSER", "THCSCRSER", "THCU", "CANNABQUANT", "OPIATESCRSER", "OXYSCRSER", "PROPOXSCRSER", "ETH", "CBDTHCR", "D8THCCBX", "D9THCCBX" List of all UDS test(s) done:  Lab Results  Component Value Date   SUMMARY Note 02/01/2022   Last UDS on record: Summary  Date Value Ref Range Status  02/01/2022 Note  Final    Comment:    ==================================================================== Compliance Drug Analysis, Ur ==================================================================== Test                             Result       Flag       Units  Drug Present and Declared for Prescription Verification   Primidone  PRESENT      EXPECTED   Phenobarbital                  PRESENT      EXPECTED    Phenobarbital is an expected metabolite of primidone; Phenobarbital    may also be administered as a prescription drug.    Gabapentin                     PRESENT      EXPECTED  Drug Present not Declared for Prescription Verification   Acetaminophen                  PRESENT      UNEXPECTED  Drug Absent but Declared for Prescription Verification   Salicylate                     Not Detected UNEXPECTED    Aspirin, as indicated in the declared medication list, is not always    detected even when used as  directed.  ==================================================================== Test                      Result    Flag   Units      Ref Range   Creatinine              28               mg/dL      >=20 ==================================================================== Declared Medications:  The flagging and interpretation on this report are based on the  following declared medications.  Unexpected results may arise from  inaccuracies in the declared medications.   **Note: The testing scope of this panel includes these medications:   Gabapentin (Neurontin)  Primidone (Mysoline)   **Note: The testing scope of this panel does not include small to  moderate amounts of these reported medications:   Aspirin   **Note: The testing scope of this panel does not include the  following reported medications:   Iron  Levothyroxine (Synthroid)  Magnesium  Multivitamin  Pramipexole (Mirapex)  Probiotic  Simvastatin (Zocor)  Supplement (PreserVision)  Vitamin D ==================================================================== For clinical consultation, please call [phone]. ====================================================================    UDS interpretation: No unexpected findings.          Medication Assessment Form: Not applicable. No opioids. Treatment compliance: Not applicable Risk Assessment Profile: Aberrant behavior: See initial evaluations. None observed or detected today Comorbid factors increasing risk of overdose: See initial evaluation. No additional risks detected today Opioid risk tool (ORT):     02/01/2022    2:15 PM  Opioid Risk   Alcohol 0  Illegal Drugs 0  Rx Drugs 0  Alcohol 0  Illegal Drugs 0  Rx Drugs 0  Age between 16-45 years  0  History of Preadolescent Sexual Abuse 0  Psychological Disease 0  Depression 0  Opioid Risk Tool Scoring 0  Opioid Risk Interpretation Low Risk    ORT Scoring interpretation table:  Score <3 = Low Risk  for SUD  Score between 4-7 = Moderate Risk for SUD  Score >8 = High Risk for Opioid Abuse   Risk of substance use disorder (SUD): Low  Risk Mitigation Strategies:  Patient opioid safety counseling: No controlled substances prescribed. Patient-Prescriber Agreement (PPA): No agreement signed.  Controlled substance notification to other providers: None required. No opioid therapy.  Pharmacologic Plan: Non-opioid analgesic therapy offered. Interventional alternatives discussed.  Laboratory Chemistry Profile   Renal Lab Results  Component Value Date   BUN 23 02/01/2022   CREATININE 1.00 02/01/2022   BCR 23 02/01/2022   GFRAA >60 04/28/2016   GFRNONAA 47 (L) 04/09/2021     Electrolytes Lab Results  Component Value Date   NA 138 02/01/2022   K 4.7 02/01/2022   CL 102 02/01/2022   CALCIUM 8.9 02/01/2022   MG 2.6 (H) 02/01/2022     Hepatic Lab Results  Component Value Date   AST 21 02/01/2022   ALT 23 04/09/2021   ALBUMIN 4.4 02/01/2022   ALKPHOS 74 02/01/2022   LIPASE 41 04/09/2021     ID Lab Results  Component Value Date   SARSCOV2NAA NEGATIVE 04/09/2021     Bone Lab Results  Component Value Date   25OHVITD1 47 02/01/2022   25OHVITD2 <1.0 02/01/2022   25OHVITD3 47 02/01/2022     Endocrine Lab Results  Component Value Date   GLUCOSE 91 02/01/2022     Neuropathy Lab Results  Component Value Date   VOZDGUYQ03 474 02/01/2022     CNS No results found for: "COLORCSF", "APPEARCSF", "RBCCOUNTCSF", "WBCCSF", "POLYSCSF", "LYMPHSCSF", "EOSCSF", "PROTEINCSF", "GLUCCSF", "JCVIRUS", "CSFOLI", "IGGCSF", "LABACHR", "ACETBL"   Inflammation (CRP: Acute  ESR: Chronic) Lab Results  Component Value Date   CRP 1 02/01/2022   ESRSEDRATE 20 02/01/2022     Rheumatology No results found for: "RF", "ANA", "LABURIC", "URICUR", "LYMEIGGIGMAB", "LYMEABIGMQN", "HLAB27"   Coagulation Lab Results  Component Value Date   INR 1.1 10/28/2019   LABPROT 13.7  10/28/2019   APTT 38 (H) 10/28/2019   PLT 170 04/09/2021     Cardiovascular Lab Results  Component Value Date   HGB 14.0 04/09/2021   HCT 42.7 04/09/2021     Screening Lab Results  Component Value Date   SARSCOV2NAA NEGATIVE 04/09/2021     Cancer No results found for: "CEA", "CA125", "LABCA2"   Allergens No results found for: "ALMOND", "APPLE", "ASPARAGUS", "AVOCADO", "BANANA", "BARLEY", "BASIL", "BAYLEAF", "GREENBEAN", "LIMABEAN", "WHITEBEAN", "BEEFIGE", "REDBEET", "BLUEBERRY", "BROCCOLI", "CABBAGE", "MELON", "CARROT", "CASEIN", "CASHEWNUT", "CAULIFLOWER", "CELERY"     Note: Lab results reviewed.  Recent Diagnostic Imaging Review  Cervical Imaging: Cervical MR wo contrast: No results found for this or any previous visit.  Cervical MR wo contrast: No valid procedures specified. Cervical CT wo contrast: No results found for this or any previous visit.  Cervical DG Bending/F/E views: No results found for this or any previous visit.   Shoulder Imaging: Shoulder-R MR wo contrast: No results found for this or any previous visit.  Shoulder-L MR wo contrast: No results found for this or any previous visit.  Shoulder-R DG: No results found for this or any previous visit.  Shoulder-L DG: No results found for this or any previous visit.   Thoracic Imaging: Thoracic MR wo contrast: No results found for this or any previous visit.  Thoracic MR wo contrast: No valid procedures specified. Thoracic CT wo contrast: No results found for this or any previous visit.  Thoracic DG 4 views: No results found for this or any previous visit.  Thoracic DG w/swimmers view: No results found for this or any previous visit.   Lumbosacral Imaging: Lumbar MR wo contrast: No results found for this or any previous visit.  Lumbar MR wo contrast: No valid procedures specified. Lumbar CT wo contrast: No results found for this or any previous visit.  Lumbar DG Bending views: Results for orders  placed during the hospital encounter of 02/07/22  DG Lumbar Spine Complete W/Bend  Narrative CLINICAL DATA:  Low back pain  EXAM: LUMBAR SPINE - COMPLETE WITH BENDING VIEWS  COMPARISON:  10/28/2019  FINDINGS: Frontal, bilateral oblique, lateral neutral, lateral flexion, lateral extension views of the lumbar spine are obtained. There are 5 non-rib-bearing lumbar type vertebral bodies with marked right convex scoliosis centered at the L2-3 level. There are no acute displaced fractures. Extensive multilevel lumbar spondylosis greatest at the L2-3 and L3-4 levels. Diffuse facet hypertrophy greatest at the lumbosacral junction. No instability with flexion or extension. Sacroiliac joints are unremarkable.  IMPRESSION: 1. Multilevel spondylosis and facet hypertrophy, with moderate progression since prior CT. 2. No acute bony abnormality. 3. Right convex scoliosis.   Electronically Signed By: Randa Ngo M.D. On: 02/08/2022 18:53         Sacroiliac Joint Imaging: Sacroiliac Joint DG: No results found for this or any previous visit.   Hip Imaging: Hip-R MR wo contrast: No results found for this or any previous visit.  Hip-L MR wo contrast: No results found for this or any previous visit.  Hip-R CT wo contrast: No results found for this or any previous visit.  Hip-L CT wo contrast: No results found for this or any previous visit.  Hip-R DG 2-3 views: No results found for this or any previous visit.  Hip-L DG 2-3 views: No results found for this or any previous visit.  Hip-B DG Bilateral: No results found for this or any previous visit.   Knee Imaging: Knee-R MR wo contrast: No results found for this or any previous visit.  Knee-L MR wo contrast: No results found for this or any previous visit.  Knee-R CT wo contrast: No results found for this or any previous visit.  Knee-L CT wo contrast: No results found for this or any previous visit.  Knee-R DG 4 views: No  results found for this or any previous visit.  Knee-L DG 4 views: No results found for this or any previous visit.   Ankle Imaging: Ankle-R DG Complete: No results found for this or any previous visit.  Ankle-L DG Complete: No results found for this or any previous visit.   Foot Imaging: Foot-R DG Complete: No results found for this or any previous visit.  Foot-L DG Complete: No results found for this or any previous visit.   Elbow Imaging: Elbow-R DG Complete: No results found for this or any previous visit.  Elbow-L DG Complete: No results found for this or any previous visit.   Wrist Imaging: Wrist-R DG Complete: No results found for this or any previous visit.  Wrist-L DG Complete: No results found for this or any previous visit.   Hand Imaging: Hand-R DG Complete: No results found for this or any previous visit.  Hand-L DG Complete: No results found for this or any previous visit.   Complexity Note: Imaging results reviewed.                         Meds   Current Outpatient Medications:    ASPIRIN 81 PO, Take 81 mg by mouth daily., Disp: , Rfl:    cholecalciferol (VITAMIN D) 1000 units tablet, Take 1,000 Units by mouth daily., Disp: , Rfl:    gabapentin (NEURONTIN) 100 MG capsule, Take 100 mg by mouth 2 (two) times daily. Two at noon, one at night, Disp: , Rfl:    gabapentin (NEURONTIN) 400 MG capsule, Take 400 mg by mouth 2 (two) times  daily. One at evening meal and one at bedtime, Disp: , Rfl:    IRON CR PO, Take 45 mg by mouth daily., Disp: , Rfl:    levothyroxine (SYNTHROID) 88 MCG tablet, Take by mouth., Disp: , Rfl:    levothyroxine (SYNTHROID) 88 MCG tablet, Take 1 tablet (88 mcg total) by mouth once daily Monday-Saturday. Take HALF tablet (44 mcg total) on Sundays. Take on an empty stomach with a glass of water., Disp: , Rfl:    MAGNESIUM GLYCINATE PLUS PO, Take 400 mg by mouth 2 (two) times daily. , Disp: , Rfl:    Multiple Vitamin (MULTIVITAMIN) tablet,  Take 1 tablet by mouth daily., Disp: , Rfl:    Multiple Vitamins-Minerals (PRESERVISION AREDS) CAPS, Take by mouth., Disp: , Rfl:    pramipexole (MIRAPEX) 0.125 MG tablet, Take 0.25 mg by mouth at bedtime. , Disp: , Rfl:    primidone (MYSOLINE) 50 MG tablet, Take 50 mg by mouth at bedtime. Three at night, Disp: , Rfl:    Probiotic Product ([place]), Take 1 tablet by mouth daily. , Disp: , Rfl:    simvastatin (ZOCOR) 40 MG tablet, Take 40 mg by mouth at bedtime. , Disp: , Rfl:   ROS  Constitutional: Denies any fever or chills Gastrointestinal: No reported hemesis, hematochezia, vomiting, or acute GI distress Musculoskeletal: Denies any acute onset joint swelling, redness, loss of ROM, or weakness Neurological: No reported episodes of acute onset apraxia, aphasia, dysarthria, agnosia, amnesia, paralysis, loss of coordination, or loss of consciousness  Allergies  Ms. Kloepfer has No Known Allergies.  PFSH  Drug: Ms. Clouse  reports no history of drug use. Alcohol:  reports no history of alcohol use. Tobacco:  reports that she has never smoked. She has never used smokeless tobacco. Medical:  has a past medical history of Carotid stenosis, Carotid stenosis, Chronic kidney disease, CKD (chronic kidney disease), Dysplastic nevus (09/22/2019), Hypothyroidism, Lung cancer ([place]) (2018), Neuro-degenerative disorders ([place]), Personal history of radiation therapy, and Uterine cancer ([place]). Surgical: Ms. Morgenthaler  has a past surgical history that includes Thyroid surgery; Colonoscopy with propofol (N/A, 12/08/2014); Flexible bronchoscopy (N/A, 04/21/2016); Eye surgery; and Abdominal hysterectomy. Family: family history includes Breast cancer in her mother; Colon cancer in her maternal grandmother; Other in her father.  Constitutional Exam  General appearance: Well nourished, well developed, and well hydrated. In no apparent acute distress There were no vitals filed for this visit. BMI  Assessment: Estimated body mass index is 28.19 kg/m as calculated from the following:   Height as of 02/01/22: '5\' 7"'  (1.702 m).   Weight as of 02/01/22: 180 lb (81.6 kg).  BMI interpretation table: BMI level Category Range association with higher incidence of chronic pain  <18 kg/m2 Underweight   18.5-24.9 kg/m2 Ideal body weight   25-29.9 kg/m2 Overweight Increased incidence by 20%  30-34.9 kg/m2 Obese (Class I) Increased incidence by 68%  35-39.9 kg/m2 Severe obesity (Class II) Increased incidence by 136%  >40 kg/m2 Extreme obesity (Class III) Increased incidence by 254%   Patient's current BMI Ideal Body weight  There is no height or weight on file to calculate BMI. Patient weight not recorded   BMI Readings from Last 4 Encounters:  02/01/22 28.19 kg/m  06/02/21 26.67 kg/m  04/09/21 25.84 kg/m  12/01/20 26.71 kg/m   Wt Readings from Last 4 Encounters:  02/01/22 180 lb (81.6 kg)  06/02/21 180 lb 9.6 oz (81.9 kg)  04/09/21 175 lb (79.4 kg)  12/01/20 180 lb  14.4 oz (82.1 kg)    Psych/Mental status: Alert, oriented x 3 (person, place, & time)       Eyes: PERLA Respiratory: No evidence of acute respiratory distress  Assessment & Plan  Primary Diagnosis & Pertinent Problem List: The primary encounter diagnosis was Chronic pain syndrome. 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  Visit Diagnosis: 1. Chronic pain syndrome   2. Chronic low back pain (1ry  area of Pain) (Bilateral) (R=L) w/o sciatica   3. Grade 1 Anterolisthesis of lumbosacral spine (L5/S1)   4. Chronic mid back pain   5. Lumbar scoliosis due to degenerative disease of spine in adult   6. Lumbar spondylolisthesis (L5-S1 3 mm Anterolisthesis) (Right: L3-4 9 mm translation)   7. Lumbar facet hypertrophy (Multilevel) (Bilateral)   8. Lumbar facet joint osteoarthritis   9. DDD (degenerative disc disease), lumbar   10. DJD (degenerative joint disease), lumbosacral   11. 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 Followed and controlled on diet  Last Assessment & Plan:  Formatting of this note might be different from the original. Glucose is followed and controlled on diet   Aortic Atherosclerosis (Hcc)   Formatting of this note might be different from the original. Ct , simvastatin   Last Assessment & Plan:  Formatting of this note might be different from the original. Tolerating simvastatin  Formatting of this note might be different from the original. Ct , simvastatin   Last Assessment & Plan:  Formatting of this note might be different from the original. Continued on simvastatin   Carotid Artery Disease (Hcc)   Last Assessment & Plan:  No new neuro symptoms on medical treatment  Last Assessment & Plan:  Formatting of this note might be different from the original. No new focal neuro symptoms  Last Assessment & Plan:  Formatting of this note might be different from the original. No new focal neuro changes   Restless Legs Syndrome  Hypothyroid   Last Assessment & Plan:  Tsh and energy are stable.   Last Assessment & Plan:  Formatting of this note might be different from the original. Tsh and energy are stable  Last Assessment & Plan:  Formatting of this note might be different from  the original. Tsh and energy are doing well     Plan of Care  Pharmacotherapy (Medications Ordered): No orders of the defined types were placed in this encounter.  Procedure Orders    No procedure(s) ordered today   Lab Orders  No laboratory test(s) ordered today   Imaging Orders  No imaging studies ordered today   Referral Orders  No referral(s) requested today    Pharmacological management:  Opioid Analgesics: I will not be prescribing any opioids at this time Membrane stabilizer: I will not be prescribing any at this time Muscle relaxant: I will not be prescribing any at this time NSAID: I will not be prescribing  any at this time Other analgesic(s): I will not be prescribing any at this time     Interventional Therapies  Risk  Complexity Considerations:   Estimated body mass index is 28.19 kg/m as calculated from the following:   Height as of this encounter: '5\' 7"'  (1.702 m).   Weight as of this encounter: 180 lb (81.6 kg). WNL   Planned  Pending:   (02/01/2022) referral for EMG/PNCV of lower extremities    Under consideration:   Diagnostic bilateral lumbar facet MBB (L2-S1) #1  Possible bilateral lumbar facet RFA (L2-S1) #1  Possible bilateral spinal cord stimulator trial/implant    Completed:   None at this time   Completed by other providers:   Diagnostic/therapeutic bilateral S1 TFESI x5 (11/20/2019, 12/24/2019, 04/30/2020, 06/30/2020, 04/08/2021) by Girtha Hake, MD [facility])  Diagnostic bilateral L3, L4, and L5 MBB x2 (05/26/2021, 06/10/2021) by Girtha Hake, MD [facility])  Therapeutic bilateral L3, L4, and L5 MB RFA x1 (10/12/2021) by Sharlet Salina, DO [facility])    Therapeutic  Palliative (PRN) options:   None established     Provider-requested follow-up: No follow-ups on file. Recent Visits Date Type Provider Dept  02/01/22 Office Visit Milinda Pointer, MD [facility]  Showing recent visits within past 90 days and meeting all other  requirements Future Appointments Date Type Provider Dept  03/15/22 Appointment Milinda Pointer, MD [facility]  Showing future appointments within next 90 days and meeting all other requirements  Primary Care Physician: Kirk Ruths, MD Note by: Gaspar Cola, MD Date: 03/15/2022; Time: 8:26 AM

## 2022-03-15 ENCOUNTER — Ambulatory Visit: Payer: Medicare PPO | Admitting: Pain Medicine

## 2022-04-23 NOTE — Progress Notes (Unsigned)
PROVIDER NOTE: Information contained herein reflects review and annotations entered in association with encounter. Interpretation of such information and data should be left to medically-trained personnel. Information provided to patient can be located elsewhere in the medical record under "Patient Instructions". Document created using STT-dictation technology, any transcriptional errors that may result from process are unintentional.    Patient: Shelley Mahoney  Service Category: E/M  Provider: Gaspar Cola, MD  DOB: 10-31-1931  DOS: 04/24/2022  Referring Provider: Kirk Ruths, MD  MRN: [id number]  Specialty: Interventional Pain Management  PCP: Kirk Ruths, MD  Type: Established Patient  Setting: Ambulatory outpatient    Location: Office  Delivery: Face-to-face     Primary Reason(s) for Visit: Encounter for evaluation before starting new chronic pain management plan of care (Level of risk: moderate) CC: Back Pain (lower)  HPI  Shelley Mahoney is a 88 y.o. year old, female patient, who comes today for a follow-up evaluation to review the test results and decide on a treatment plan. She has Mass of upper lobe of left lung; Primary cancer of left upper lobe of lung ([place]); Carotid artery disease (HCC); CKD (chronic kidney disease) stage 3, GFR 30-59 ml/min ([place]); Dysphonia of essential tremor; Hypothyroid; Iron deficiency; Malignant neoplasm of upper lobe of left lung ([place]); Neuropathy; Periodic limb movement sleep disorder; Primary insomnia; Restless legs syndrome; Vocal tremor; History of skin cancer; Lung cancer ([place]); Aortic atherosclerosis ([place]); Chronic low back pain (1ry area of Pain) (Bilateral) (R=L) w/o sciatica; Health care maintenance; Lumbar stenosis without neurogenic claudication; Prediabetes; Status post deep brain stimulator placement; Strain of right knee; Essential tremor; Chronic pain syndrome; Pharmacologic therapy; Disorder of skeletal system; Problems influencing  health status; Abnormal CT scan, lumbar spine (10/28/2019); Grade 1 Anterolisthesis of lumbosacral spine (L5/S1); Chronic mid back pain; Lumbar scoliosis due to degenerative disease of spine in adult; Lumbar spondylolisthesis (L5-S1 3 mm Anterolisthesis) (Right: L3-4 9 mm translation); Lumbar facet hypertrophy (Multilevel) (Bilateral); Lumbar facet joint osteoarthritis; DDD (degenerative disc disease), lumbar; DJD (degenerative joint disease), lumbosacral; Ligamentum flavum hypertrophy (L2-3, L3-4, L4-5); Lumbosacral foraminal stenosis (Left: L2-3) (Bilateral: L3-4) (L>R: L4-5, L5-S1); Lumbosacral lateral recess stenosis (Left: L2-3) (Bilateral: L3-4, L4-5, L5-S1); Spondylosis without myelopathy or radiculopathy, lumbosacral region; Dextroscoliosis of lumbar spine; Lumbar facet joint pain; Lumbosacral facet joint syndrome; Chronic feet pain (2ry area of Pain) (Bilateral); Balance problem; Abnormal NCS (nerve conduction studies) (LE) (04/04/2022); and Sensory polyneuropathy (lower extremites) on their problem list. Her primarily concern today is the Back Pain (lower)  Pain Assessment: Location: Left, Right Back Radiating: Denies Onset: More than a month ago Duration: Chronic pain Quality: Aching Severity: 4  (no pain while sitting)/10 (subjective, self-reported pain score)  Effect on ADL: limits my daily activities Timing: Intermittent Modifying factors: sitting down, repositioning, ice BP: (!) 109/58  HR: (!) 106  Shelley Mahoney comes in today for a follow-up visit after her initial evaluation on 02/01/2022. Today we went over the results of her tests. These were explained in "Layman's terms". During today's appointment we went over my diagnostic impression, as well as the proposed treatment plan.  Review of initial evaluation (02/01/2022): "The patient indicates her primary pain to be that of the lower back (Bilateral) (R=L).  She denies any prior back surgeries but she does indicate doing some physical  therapy for this pain and having had a recent CT scan of the lumbar spine.  She also refers having had multiple nerve blocks in the lumbar spine.  According to my review  of the records, the patient has had those blocks done at the [facility] department.  The patient indicates that the pain is not referred anywhere else.  She denies any lower extremity pain, numbness, or weakness.  In fact, the patient refers never having had any symptoms in the lower extremity.  Although the patient is 88 years old, she seems to have no cognitive impairments.  She seems to be having very good memory and answers appropriately to all of my questions.  I mention this because according to the medical records her diagnosis was that of chronic bilateral low back pain with bilateral sciatica, but I was unable to find any evidence of the sciatica.  Initially I would have thought that the patient had some radicular symptoms since between 11/20/2019 and 04/08/2021 the patient had 5 different treatments involving bilateral S1 transforaminal epidural steroid injections done by Dr. Girtha Hake.  The patient indicated that none of those did provide her with any significant benefit.  Subsequently she had 2 diagnostic bilateral L3, L4, and L5 medial branch blocks where the patient indicated having attained some short-term relief, but not complete.  She also indicated no long-term benefit from those.  Because the L3, L4, and L5 medial branches were blocked, this effectively blocked the L4-5 facet joint while partially blocking the lower portion of the L3-4 facet joint capsule and also partially blocking the L5-S1 facet joint.  A recent CT of the lumbar spine done on 10/28/2019 demonstrates the patient to be having Lumbar facet disease affecting the L2-3, L3-4, L4-5, and L5-S1 facet joint levels.  Interventional therapies: Diagnostic/therapeutic bilateral S1 TFESI x5 (11/20/2019, 12/24/2019, 04/30/2020, 06/30/2020, 04/08/2021) by Girtha Hake, MD [facility])  Diagnostic bilateral L3, L4, and L5 MBB x2 (05/26/2021, 06/10/2021) by Girtha Hake, MD [facility])  Therapeutic bilateral L3, L4, and L5 MB RFA x1 (10/12/2021) by Sharlet Salina, DO [facility] PMR)   The patient's secondary area pain is that of her feet (Bilateral).  She indicates having neuropathy of both of her feet which according to her was diagnosed by a neurologist at [facility].  The patient denies ever having had a nerve conduction test done.  The patient denies having any diabetes.  The patient was told that she was being referred to [place] for possible spinal cord stimulator evaluation.  As part of our work-up, today we have ordered lab work, x-rays of the lumbar and thoracic spine to evaluate the patient's degenerative spondylolisthesis.  We will be ordering flexion-extension x-rays to evaluate for instability.  In addition, we will be ordering an EMG/PNCV of the lower extremities to evaluate for peripheral neuropathy versus radiculopathy.  I have also provided the patient with information regarding spinal cord stimulator implants.  I will see her back once she has completed the above testing at which time I will go over those results and determine what we can offer her.  I will also try to answer all of her questions regarding the implant and if at the time she decides that she is interested in the implant, then we will be sending her for the medical psychology evaluation for the implant.  The plan was shared with the patient who understood and accepted.  Aside from the above, the patient also has a history of a deep brain stimulator implant done at [facility] by Dr. Radford Pax.  She describes that this was implanted approximately 4 to 5 years ago.  She also indicates that he was implanted for the treatment of central  tremors.  She indicates that it has not only helped with tremors, but it also helped with her voice trembling.  The patient has a history of bilateral lower extremity jerks that  normally occur at bedtime.  These have been described before as restless leg syndrome.  Because of the patient's deep brain stimulator, she cannot have an MRI done.  However the implant card indicates that it is an MRI conditional implant indicating that she had an MRI be done, he should be using a 1.5 TELSA MRI, or less.  The patient also refers having stage III chronic kidney disease and for this reason she is unable to take any NSAIDs.  She does take Tylenol ES 2 tablets 3 times daily (3000 mg/day)."  Today we have gone over the results of the lab work and ordered imaging studies.  Comprehensive metabolic panel shows a decreased eGFR of 54 (normal more than 59).  In addition it showed elevated levels of magnesium (2.6) (normal 1.6-2.3).  All other labs were within normal limits.  Diagnostic x-rays of the lumbar spine with bending views show multilevel spondylosis with facet hypertrophy affecting multiple levels.  In addition it shows a right convex scoliosis (dextroscoliosis).  No instability detected between flexion and extension views.  Lumbar facet hypertrophy is greatest at the lumbosacral junction (L5-S1).  EMG/PNCV (04/04/2022).  According to Care Everywhere, on 04/04/2022 she had her nerve conduction test done by Dr. Verne Carrow at the [facility] neurology department.  However the official report is not available at this time.  After having gone over the above, we have decided to schedule the patient for a diagnostic bilateral lumbar facet block.  Since the patient indicates that the prior procedures done at the [facility] seem to have provided her with incomplete coverage of the pain, we will extend to include the S1 and L2 medial branches.  Shelley Mahoney was informed that I am currently unable to take patients for medication management. If interested, she will be offered a pharmacotherapy evaluation, including recommendations. Treatment plan offered is in alignment with my  interventional pain management specialty.   Controlled Substance Pharmacotherapy Assessment REMS (Risk Evaluation and Mitigation Strategy)  Opioid Analgesic: None MME/day: 0 mg/day  Pill Count: None expected due to no prior prescriptions written by our practice. Chauncey Fischer, RN  04/24/2022 10:45 AM  Signed Safety precautions to be maintained throughout the outpatient stay will include: orient to surroundings, keep bed in low position, maintain call bell within reach at all times, provide assistance with transfer out of bed and ambulation.    Pharmacokinetics: Liberation and absorption (onset of action): WNL Distribution (time to peak effect): WNL Metabolism and excretion (duration of action): WNL         Pharmacodynamics: Desired effects: Analgesia: Shelley Mahoney reports >50% benefit. Functional ability: Patient reports that medication allows her to accomplish basic ADLs Clinically meaningful improvement in function (CMIF): Sustained CMIF goals met Perceived effectiveness: Described as relatively effective, allowing for increase in activities of daily living (ADL) Undesirable effects: Side-effects or Adverse reactions: None reported Monitoring: [place] PMP: PDMP reviewed during this encounter. Online review of the past 13-monthperiod previously conducted. Not applicable at this point since we have not taken over the patient's medication management yet. List of other Serum/Urine Drug Screening Test(s):  No results found for: "AMPHSCRSER", "BARBSCRSER", "BENZOSCRSER", "COCAINSCRSER", "COCAINSCRNUR", "PCPSCRSER", "THCSCRSER", "THCU", "CANNABQUANT", "OPIATESCRSER", "OXYSCRSER", "PROPOXSCRSER", "ETH", "CBDTHCR", "D8THCCBX", "D9THCCBX" List of all UDS test(s) done:  Lab Results  Component Value Date  SUMMARY Note 02/01/2022   Last UDS on record: Summary  Date Value Ref Range Status  02/01/2022 Note  Final    Comment:     ==================================================================== Compliance Drug Analysis, Ur ==================================================================== Test                             Result       Flag       Units  Drug Present and Declared for Prescription Verification   Primidone                      PRESENT      EXPECTED   Phenobarbital                  PRESENT      EXPECTED    Phenobarbital is an expected metabolite of primidone; Phenobarbital    may also be administered as a prescription drug.    Gabapentin                     PRESENT      EXPECTED  Drug Present not Declared for Prescription Verification   Acetaminophen                  PRESENT      UNEXPECTED  Drug Absent but Declared for Prescription Verification   Salicylate                     Not Detected UNEXPECTED    Aspirin, as indicated in the declared medication list, is not always    detected even when used as directed.  ==================================================================== Test                      Result    Flag   Units      Ref Range   Creatinine              28               mg/dL      >=20 ==================================================================== Declared Medications:  The flagging and interpretation on this report are based on the  following declared medications.  Unexpected results may arise from  inaccuracies in the declared medications.   **Note: The testing scope of this panel includes these medications:   Gabapentin (Neurontin)  Primidone (Mysoline)   **Note: The testing scope of this panel does not include small to  moderate amounts of these reported medications:   Aspirin   **Note: The testing scope of this panel does not include the  following reported medications:   Iron  Levothyroxine (Synthroid)  Magnesium  Multivitamin  Pramipexole (Mirapex)  Probiotic  Simvastatin (Zocor)  Supplement (PreserVision)  Vitamin  D ==================================================================== For clinical consultation, please call [phone]. ====================================================================    UDS interpretation: No unexpected findings.          Medication Assessment Form: Not applicable. No opioids. Treatment compliance: Not applicable Risk Assessment Profile: Aberrant behavior: See initial evaluations. None observed or detected today Comorbid factors increasing risk of overdose: See initial evaluation. No additional risks detected today Opioid risk tool (ORT):     02/01/2022    2:15 PM  Opioid Risk   Alcohol 0  Illegal Drugs 0  Rx Drugs 0  Alcohol 0  Illegal Drugs 0  Rx Drugs 0  Age between 16-45 years  0  History of Preadolescent Sexual Abuse  0  Psychological Disease 0  Depression 0  Opioid Risk Tool Scoring 0  Opioid Risk Interpretation Low Risk    ORT Scoring interpretation table:  Score <3 = Low Risk for SUD  Score between 4-7 = Moderate Risk for SUD  Score >8 = High Risk for Opioid Abuse   Risk of substance use disorder (SUD): Low  Risk Mitigation Strategies:  Patient opioid safety counseling: No controlled substances prescribed. Patient-Prescriber Agreement (PPA): No agreement signed.  Controlled substance notification to other providers: None required. No opioid therapy.  Pharmacologic Plan: Non-opioid analgesic therapy offered. Interventional alternatives discussed.             Laboratory Chemistry Profile   Renal Lab Results  Component Value Date   BUN 23 02/01/2022   CREATININE 1.00 02/01/2022   BCR 23 02/01/2022   GFRAA >60 04/28/2016   GFRNONAA 47 (L) 04/09/2021     Electrolytes Lab Results  Component Value Date   NA 138 02/01/2022   K 4.7 02/01/2022   CL 102 02/01/2022   CALCIUM 8.9 02/01/2022   MG 2.6 (H) 02/01/2022     Hepatic Lab Results  Component Value Date   AST 21 02/01/2022   ALT 23 04/09/2021   ALBUMIN 4.4 02/01/2022    ALKPHOS 74 02/01/2022   LIPASE 41 04/09/2021     ID Lab Results  Component Value Date   SARSCOV2NAA NEGATIVE 04/09/2021     Bone Lab Results  Component Value Date   25OHVITD1 47 02/01/2022   25OHVITD2 <1.0 02/01/2022   25OHVITD3 47 02/01/2022     Endocrine Lab Results  Component Value Date   GLUCOSE 91 02/01/2022     Neuropathy Lab Results  Component Value Date   BWGYKZLD35 701 02/01/2022     CNS No results found for: "COLORCSF", "APPEARCSF", "RBCCOUNTCSF", "WBCCSF", "POLYSCSF", "LYMPHSCSF", "EOSCSF", "PROTEINCSF", "GLUCCSF", "JCVIRUS", "CSFOLI", "IGGCSF", "LABACHR", "ACETBL"   Inflammation (CRP: Acute  ESR: Chronic) Lab Results  Component Value Date   CRP 1 02/01/2022   ESRSEDRATE 20 02/01/2022     Rheumatology No results found for: "RF", "ANA", "LABURIC", "URICUR", "LYMEIGGIGMAB", "LYMEABIGMQN", "HLAB27"   Coagulation Lab Results  Component Value Date   INR 1.1 10/28/2019   LABPROT 13.7 10/28/2019   APTT 38 (H) 10/28/2019   PLT 170 04/09/2021     Cardiovascular Lab Results  Component Value Date   HGB 14.0 04/09/2021   HCT 42.7 04/09/2021     Screening Lab Results  Component Value Date   SARSCOV2NAA NEGATIVE 04/09/2021     Cancer No results found for: "CEA", "CA125", "LABCA2"   Allergens No results found for: "ALMOND", "APPLE", "ASPARAGUS", "AVOCADO", "BANANA", "BARLEY", "BASIL", "BAYLEAF", "GREENBEAN", "LIMABEAN", "WHITEBEAN", "BEEFIGE", "REDBEET", "BLUEBERRY", "BROCCOLI", "CABBAGE", "MELON", "CARROT", "CASEIN", "CASHEWNUT", "CAULIFLOWER", "CELERY"     Note: Lab results reviewed.  Recent Diagnostic Imaging Review  Lumbosacral Imaging: Lumbar DG Bending views: Results for orders placed during the hospital encounter of 02/07/22 DG Lumbar Spine Complete W/Bend  Narrative CLINICAL DATA:  Low back pain  EXAM: LUMBAR SPINE - COMPLETE WITH BENDING VIEWS  COMPARISON:  10/28/2019  FINDINGS: Frontal, bilateral oblique, lateral neutral,  lateral flexion, lateral extension views of the lumbar spine are obtained. There are 5 non-rib-bearing lumbar type vertebral bodies with marked right convex scoliosis centered at the L2-3 level. There are no acute displaced fractures. Extensive multilevel lumbar spondylosis greatest at the L2-3 and L3-4 levels. Diffuse facet hypertrophy greatest at the lumbosacral junction. No instability with flexion or extension. Sacroiliac joints are unremarkable.  IMPRESSION: 1. Multilevel spondylosis and facet hypertrophy, with moderate progression since prior CT. 2. No acute bony abnormality. 3. Right convex scoliosis.   Electronically Signed By: Randa Ngo M.D. On: 02/08/2022 18:53  Complexity Note: Imaging results reviewed.                         Meds   Current Outpatient Medications:    ASPIRIN 81 PO, Take 81 mg by mouth daily., Disp: , Rfl:    cholecalciferol (VITAMIN D) 1000 units tablet, Take 1,000 Units by mouth daily., Disp: , Rfl:    gabapentin (NEURONTIN) 100 MG capsule, Take 100 mg by mouth 2 (two) times daily. Two at noon, one at night, Disp: , Rfl:    gabapentin (NEURONTIN) 400 MG capsule, Take 400 mg by mouth 2 (two) times daily. One at evening meal and one at bedtime, Disp: , Rfl:    IRON CR PO, Take 45 mg by mouth daily., Disp: , Rfl:    levothyroxine (SYNTHROID) 88 MCG tablet, Take 1 tablet (88 mcg total) by mouth once daily Monday-Saturday. Take HALF tablet (44 mcg total) on Sundays. Take on an empty stomach with a glass of water., Disp: , Rfl:    MAGNESIUM GLYCINATE PLUS PO, Take 400 mg by mouth 2 (two) times daily. , Disp: , Rfl:    Multiple Vitamin (MULTIVITAMIN) tablet, Take 1 tablet by mouth daily., Disp: , Rfl:    Multiple Vitamins-Minerals (PRESERVISION AREDS) CAPS, Take by mouth., Disp: , Rfl:    pramipexole (MIRAPEX) 0.125 MG tablet, Take 0.25 mg by mouth at bedtime. , Disp: , Rfl:    primidone (MYSOLINE) 50 MG tablet, Take 50 mg by mouth at bedtime. Three at  night, Disp: , Rfl:    Probiotic Product ([place]), Take 1 tablet by mouth daily. , Disp: , Rfl:    simvastatin (ZOCOR) 40 MG tablet, Take 40 mg by mouth at bedtime. , Disp: , Rfl:   ROS  Constitutional: Denies any fever or chills Gastrointestinal: No reported hemesis, hematochezia, vomiting, or acute GI distress Musculoskeletal: Denies any acute onset joint swelling, redness, loss of ROM, or weakness Neurological: No reported episodes of acute onset apraxia, aphasia, dysarthria, agnosia, amnesia, paralysis, loss of coordination, or loss of consciousness  Allergies  Shelley Mahoney has No Known Allergies.  PFSH  Drug: Shelley Mahoney  reports no history of drug use. Alcohol:  reports no history of alcohol use. Tobacco:  reports that she has never smoked. She has never used smokeless tobacco. Medical:  has a past medical history of Carotid stenosis, Carotid stenosis, Chronic kidney disease, CKD (chronic kidney disease), Dysplastic nevus (09/22/2019), Hypothyroidism, Lung cancer ([place]) (2018), Neuro-degenerative disorders ([place]), Personal history of radiation therapy, and Uterine cancer ([place]). Surgical: Shelley Mahoney  has a past surgical history that includes Thyroid surgery; Colonoscopy with propofol (N/A, 12/08/2014); Flexible bronchoscopy (N/A, 04/21/2016); Eye surgery; and Abdominal hysterectomy. Family: family history includes Breast cancer in her mother; Colon cancer in her maternal grandmother; Other in her father.  Constitutional Exam  General appearance: Well nourished, well developed, and well hydrated. In no apparent acute distress Vitals:   04/24/22 0946  BP: (!) 109/58  Pulse: (!) 106  Temp: (!) 96.9 F (36.1 C)  SpO2: 98%  Weight: 178 lb (80.7 kg)  Height: _0  (1.727 m)   BMI Assessment: Estimated body mass index is 27.06 kg/m as calculated from the following:   Height as of this encounter: 5'  8" (1.727 m).   Weight as of this encounter: 178 lb (80.7 kg).  BMI  interpretation table: BMI level Category Range association with higher incidence of chronic pain  <18 kg/m2 Underweight   18.5-24.9 kg/m2 Ideal body weight   25-29.9 kg/m2 Overweight Increased incidence by 20%  30-34.9 kg/m2 Obese (Class I) Increased incidence by 68%  35-39.9 kg/m2 Severe obesity (Class II) Increased incidence by 136%  >40 kg/m2 Extreme obesity (Class III) Increased incidence by 254%   Patient's current BMI Ideal Body weight  Body mass index is 27.06 kg/m. Ideal body weight: 63.9 kg (140 lb 14 oz) Adjusted ideal body weight: 70.6 kg (155 lb 11.6 oz)   BMI Readings from Last 4 Encounters:  04/24/22 27.06 kg/m  02/01/22 28.19 kg/m  06/02/21 26.67 kg/m  04/09/21 25.84 kg/m   Wt Readings from Last 4 Encounters:  04/24/22 178 lb (80.7 kg)  02/01/22 180 lb (81.6 kg)  06/02/21 180 lb 9.6 oz (81.9 kg)  04/09/21 175 lb (79.4 kg)    Psych/Mental status: Alert, oriented x 3 (person, place, & time)       Eyes: PERLA Respiratory: No evidence of acute respiratory distress  Assessment & Plan  Primary Diagnosis & Pertinent Problem List: The primary encounter diagnosis was Chronic low back pain (1ry area of Pain) (Bilateral) (R=L) w/o sciatica. Diagnoses of Lumbar facet joint pain, Lumbosacral facet joint syndrome, Lumbar facet joint osteoarthritis, Lumbar facet hypertrophy (Multilevel) (Bilateral), Spondylosis without myelopathy or radiculopathy, lumbosacral region, Grade 1 Anterolisthesis of lumbosacral spine (L5/S1), Lumbar spondylolisthesis (L5-S1 3 mm Anterolisthesis) (Right: L3-4 9 mm translation), Dextroscoliosis of lumbar spine, Abnormal CT scan, lumbar spine (10/28/2019), Chronic feet pain (2ry area of Pain) (Bilateral), Balance problem, Abnormal NCS (nerve conduction studies) (LE) (04/04/2022), and Sensory polyneuropathy (lower extremites) were also pertinent to this visit.  Visit Diagnosis: 1. Chronic low back pain (1ry area of Pain) (Bilateral) (R=L) w/o sciatica    2. Lumbar facet joint pain   3. Lumbosacral facet joint syndrome   4. Lumbar facet joint osteoarthritis   5. Lumbar facet hypertrophy (Multilevel) (Bilateral)   6. Spondylosis without myelopathy or radiculopathy, lumbosacral region   7. Grade 1 Anterolisthesis of lumbosacral spine (L5/S1)   8. Lumbar spondylolisthesis (L5-S1 3 mm Anterolisthesis) (Right: L3-4 9 mm translation)   9. Dextroscoliosis of lumbar spine   10. Abnormal CT scan, lumbar spine (10/28/2019)   11. Chronic feet pain (2ry area of Pain) (Bilateral)   12. Balance problem   13. Abnormal NCS (nerve conduction studies) (LE) (04/04/2022)   14. 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   Sensory polyneuropathy (lower extremites)  Balance Problem    Plan of Care  Pharmacotherapy (Medications Ordered): No orders of the defined types were placed in this encounter.  Procedure Orders         LUMBAR FACET(MEDIAL BRANCH NERVE BLOCK) MBNB     Lab Orders  No laboratory test(s) ordered today   Imaging Orders  No imaging studies ordered today   Referral Orders  Ambulatory referral to Physical Therapy      Pharmacological management:  Opioid Analgesics: I will not be prescribing any opioids at this time Membrane stabilizer: I will not be prescribing any at this time Muscle relaxant: I will not be prescribing any at this time NSAID: I will not be prescribing any at this  time Other analgesic(s): I will not be prescribing any at this time     Interventional Therapies  Risk  Complexity Considerations:   Advanced age   Planned  Pending:   Diagnostic bilateral lumbar facet (L2-S1) MBB #1  (04/04/2022) EMG/PNCV of lower extremities report. Not available at the time of this evaluation (04/24/2022).   Under consideration:   Diagnostic bilateral lumbar facet MBB (L2-S1) #1  Possible bilateral lumbar facet RFA (L2-S1) #1  Possible bilateral spinal cord stimulator trial/implant    Completed:   None at this time   Completed by other providers:   Diagnostic/therapeutic bilateral S1 TFESI x5 (11/20/2019, 12/24/2019, 04/30/2020, 06/30/2020, 04/08/2021) by Girtha Hake, MD [facility])  Diagnostic bilateral L3, L4, and L5 MBB x2 (05/26/2021, 06/10/2021) by Girtha Hake, MD [facility])  Therapeutic bilateral L3, L4, and L5 MB RFA x1 (10/12/2021) by Sharlet Salina, DO [facility])    Therapeutic  Palliative (PRN) options:   None established    Provider-requested follow-up: Return for [facility]): (B) L-FCT Blk #1. Recent Visits Date Type Provider Dept  02/01/22 Office Visit Milinda Pointer, MD [facility]  Showing recent visits within past 90 days and meeting all other requirements Today's Visits Date Type Provider Dept  04/24/22 Office Visit Milinda Pointer, MD [facility]  Showing today's visits and meeting all other requirements Future Appointments No visits were found meeting these conditions. Showing future appointments within next 90 days and meeting all other requirements  Primary Care Physician: Kirk Ruths, MD Note by: Gaspar Cola, MD Date: 04/24/2022; Time: 2:15 PM

## 2022-04-24 ENCOUNTER — Ambulatory Visit: Payer: Medicare PPO | Attending: Pain Medicine | Admitting: Pain Medicine

## 2022-04-24 ENCOUNTER — Encounter: Payer: Self-pay | Admitting: Pain Medicine

## 2022-04-24 VITALS — BP 109/58 | HR 106 | Temp 96.9°F | Ht 68.0 in | Wt 178.0 lb

## 2022-04-24 DIAGNOSIS — M79672 Pain in left foot: Secondary | ICD-10-CM | POA: Insufficient documentation

## 2022-04-24 DIAGNOSIS — M47817 Spondylosis without myelopathy or radiculopathy, lumbosacral region: Secondary | ICD-10-CM | POA: Diagnosis not present

## 2022-04-24 DIAGNOSIS — G608 Other hereditary and idiopathic neuropathies: Secondary | ICD-10-CM | POA: Insufficient documentation

## 2022-04-24 DIAGNOSIS — M4316 Spondylolisthesis, lumbar region: Secondary | ICD-10-CM

## 2022-04-24 DIAGNOSIS — R2689 Other abnormalities of gait and mobility: Secondary | ICD-10-CM | POA: Insufficient documentation

## 2022-04-24 DIAGNOSIS — R937 Abnormal findings on diagnostic imaging of other parts of musculoskeletal system: Secondary | ICD-10-CM

## 2022-04-24 DIAGNOSIS — R94130 Abnormal response to nerve stimulation, unspecified: Secondary | ICD-10-CM | POA: Insufficient documentation

## 2022-04-24 DIAGNOSIS — M5459 Other low back pain: Secondary | ICD-10-CM

## 2022-04-24 DIAGNOSIS — M4186 Other forms of scoliosis, lumbar region: Secondary | ICD-10-CM | POA: Diagnosis present

## 2022-04-24 DIAGNOSIS — M79671 Pain in right foot: Secondary | ICD-10-CM | POA: Insufficient documentation

## 2022-04-24 DIAGNOSIS — M47816 Spondylosis without myelopathy or radiculopathy, lumbar region: Secondary | ICD-10-CM | POA: Diagnosis not present

## 2022-04-24 DIAGNOSIS — M545 Low back pain, unspecified: Secondary | ICD-10-CM | POA: Insufficient documentation

## 2022-04-24 DIAGNOSIS — G8929 Other chronic pain: Secondary | ICD-10-CM | POA: Diagnosis present

## 2022-04-24 DIAGNOSIS — M4317 Spondylolisthesis, lumbosacral region: Secondary | ICD-10-CM

## 2022-04-24 NOTE — Patient Instructions (Signed)
______________________________________________________________________  Preparing for your procedure  During your procedure appointment there will be: No Prescription Refills. No disability issues to discussed. No medication changes or discussions.  Instructions: Food intake: Avoid eating anything solid for at least 8 hours prior to your procedure. Clear liquid intake: You may take clear liquids such as water up to 2 hours prior to your procedure. (No carbonated drinks. No soda.) Transportation: Unless otherwise stated by your physician, bring a driver. Morning Medicines: Except for blood thinners, take all of your other morning medications with a sip of water. Make sure to take your heart and blood pressure medicines. If your blood pressure's lower number is above 100, the case will be rescheduled. Blood thinners: If you take a blood thinner, but were not instructed to stop it, call our office [phone] and ask to talk to a nurse. Not stopping a blood thinner prior to certain procedures could lead to serious complications. Diabetics on insulin: Notify the staff so that you can be scheduled 1st case in the morning. If your diabetes requires high dose insulin, take only  of your normal insulin dose the morning of the procedure and notify the staff that you have done so. Preventing infections: Shower with an antibacterial soap the morning of your procedure.  Build-up your immune system: Take 1000 mg of Vitamin C with every meal (3 times a day) the day prior to your procedure. Antibiotics: Inform the nursing staff if you are taking any antibiotics or if you have any conditions that may require antibiotics prior to procedures. (Example: recent joint implants)   Pregnancy: If you are pregnant make sure to notify the nursing staff. Not doing so may result in injury to the fetus, including death.  Sickness: If you have a cold, fever, or any active infections, call and cancel or reschedule your  procedure. Receiving steroids while having an infection may result in complications. Arrival: You must be in the facility at least 30 minutes prior to your scheduled procedure. Tardiness: Your scheduled time is also the cutoff time. If you do not arrive at least 15 minutes prior to your procedure, you will be rescheduled.  Children: Do not bring any children with you. Make arrangements to keep them home. Dress appropriately: There is always a possibility that your clothing may get soiled. Avoid long dresses. Valuables: Do not bring any jewelry or valuables.  Reasons to call and reschedule or cancel your procedure: (Following these recommendations will minimize the risk of a serious complication.) Surgeries: Avoid having procedures within 2 weeks of any surgery. (Avoid for 2 weeks before or after any surgery). Flu Shots: Avoid having procedures within 2 weeks of a flu shots or . (Avoid for 2 weeks before or after immunizations). Barium: Avoid having a procedure within 7-10 days after having had a radiological study involving the use of radiological contrast. (Myelograms, Barium swallow or enema study). Heart attacks: Avoid any elective procedures or surgeries for the initial 6 months after a "Myocardial Infarction" (Heart Attack). Blood thinners: It is imperative that you stop these medications before procedures. Let us know if you if you take any blood thinner.  Infection: Avoid procedures during or within two weeks of an infection (including chest colds or gastrointestinal problems). Symptoms associated with infections include: Localized redness, fever, chills, night sweats or profuse sweating, burning sensation when voiding, cough, congestion, stuffiness, runny nose, sore throat, diarrhea, nausea, vomiting, cold or Flu symptoms, recent or current infections. It is specially important if the infection is   over the area that we intend to treat. Heart and lung problems: Symptoms that may suggest an  active cardiopulmonary problem include: cough, chest pain, breathing difficulties or shortness of breath, dizziness, ankle swelling, uncontrolled high or unusually low blood pressure, and/or palpitations. If you are experiencing any of these symptoms, cancel your procedure and contact your primary care physician for an evaluation.  Remember:  Regular Business hours are:  Monday to Thursday 8:00 AM to 4:00 PM  Provider's Schedule: Yer Olivencia, MD:  Procedure days: Tuesday and Thursday 7:30 AM to 4:00 PM  Bilal Lateef, MD:  Procedure days: Monday and Wednesday 7:30 AM to 4:00 PM  ______________________________________________________________________    ____________________________________________________________________________________________  General Risks and Possible Complications  Patient Responsibilities: It is important that you read this as it is part of your informed consent. It is our duty to inform you of the risks and possible complications associated with treatments offered to you. It is your responsibility as a patient to read this and to ask questions about anything that is not clear or that you believe was not covered in this document.  Patient's Rights: You have the right to refuse treatment. You also have the right to change your mind, even after initially having agreed to have the treatment done. However, under this last option, if you wait until the last second to change your mind, you may be charged for the materials used up to that point.  Introduction: Medicine is not an exact science. Everything in Medicine, including the lack of treatment(s), carries the potential for danger, harm, or loss (which is by definition: Risk). In Medicine, a complication is a secondary problem, condition, or disease that can aggravate an already existing one. All treatments carry the risk of possible complications. The fact that a side effects or complications occurs, does not imply  that the treatment was conducted incorrectly. It must be clearly understood that these can happen even when everything is done following the highest safety standards.  No treatment: You can choose not to proceed with the proposed treatment alternative. The "PRO(s)" would include: avoiding the risk of complications associated with the therapy. The "CON(s)" would include: not getting any of the treatment benefits. These benefits fall under one of three categories: diagnostic; therapeutic; and/or palliative. Diagnostic benefits include: getting information which can ultimately lead to improvement of the disease or symptom(s). Therapeutic benefits are those associated with the successful treatment of the disease. Finally, palliative benefits are those related to the decrease of the primary symptoms, without necessarily curing the condition (example: decreasing the pain from a flare-up of a chronic condition, such as incurable terminal cancer).  General Risks and Complications: These are associated to most interventional treatments. They can occur alone, or in combination. They fall under one of the following six (6) categories: no benefit or worsening of symptoms; bleeding; infection; nerve damage; allergic reactions; and/or death. No benefits or worsening of symptoms: In Medicine there are no guarantees, only probabilities. No healthcare provider can ever guarantee that a medical treatment will work, they can only state the probability that it may. Furthermore, there is always the possibility that the condition may worsen, either directly, or indirectly, as a consequence of the treatment. Bleeding: This is more common if the patient is taking a blood thinner, either prescription or over the counter (example: Goody Powders, Fish oil, Aspirin, Garlic, etc.), or if suffering a condition associated with impaired coagulation (example: Hemophilia, cirrhosis of the liver, low platelet counts, etc.). However, even if   you  do not have one on these, it can still happen. If you have any of these conditions, or take one of these drugs, make sure to notify your treating physician. Infection: This is more common in patients with a compromised immune system, either due to disease (example: diabetes, cancer, human immunodeficiency virus [HIV], etc.), or due to medications or treatments (example: therapies used to treat cancer and rheumatological diseases). However, even if you do not have one on these, it can still happen. If you have any of these conditions, or take one of these drugs, make sure to notify your treating physician. Nerve Damage: This is more common when the treatment is an invasive one, but it can also happen with the use of medications, such as those used in the treatment of cancer. The damage can occur to small secondary nerves, or to large primary ones, such as those in the spinal cord and brain. This damage may be temporary or permanent and it may lead to impairments that can range from temporary numbness to permanent paralysis and/or brain death. Allergic Reactions: Any time a substance or material comes in contact with our body, there is the possibility of an allergic reaction. These can range from a mild skin rash (contact dermatitis) to a severe systemic reaction (anaphylactic reaction), which can result in death. Death: In general, any medical intervention can result in death, most of the time due to an unforeseen complication. ____________________________________________________________________________________________    

## 2022-04-24 NOTE — Progress Notes (Signed)
Safety precautions to be maintained throughout the outpatient stay will include: orient to surroundings, keep bed in low position, maintain call bell within reach at all times, provide assistance with transfer out of bed and ambulation.  

## 2022-05-04 ENCOUNTER — Ambulatory Visit: Payer: Medicare PPO | Attending: Pain Medicine | Admitting: Pain Medicine

## 2022-05-04 ENCOUNTER — Ambulatory Visit
Admission: RE | Admit: 2022-05-04 | Discharge: 2022-05-04 | Disposition: A | Discharge status: Home / Self Care | Payer: Medicare PPO | Source: Ambulatory Visit | Attending: Pain Medicine | Admitting: Pain Medicine

## 2022-05-04 VITALS — BP 122/65 | HR 76 | Temp 97.2°F | Resp 11 | Ht 67.0 in | Wt 176.0 lb

## 2022-05-04 DIAGNOSIS — M5459 Other low back pain: Secondary | ICD-10-CM

## 2022-05-04 DIAGNOSIS — G8929 Other chronic pain: Secondary | ICD-10-CM | POA: Diagnosis present

## 2022-05-04 DIAGNOSIS — M4316 Spondylolisthesis, lumbar region: Secondary | ICD-10-CM

## 2022-05-04 DIAGNOSIS — M51369 Other intervertebral disc degeneration, lumbar region without mention of lumbar back pain or lower extremity pain: Secondary | ICD-10-CM

## 2022-05-04 DIAGNOSIS — M4317 Spondylolisthesis, lumbosacral region: Secondary | ICD-10-CM | POA: Diagnosis present

## 2022-05-04 DIAGNOSIS — M5136 Other intervertebral disc degeneration, lumbar region: Secondary | ICD-10-CM | POA: Diagnosis present

## 2022-05-04 DIAGNOSIS — R937 Abnormal findings on diagnostic imaging of other parts of musculoskeletal system: Secondary | ICD-10-CM | POA: Diagnosis present

## 2022-05-04 DIAGNOSIS — M545 Low back pain, unspecified: Secondary | ICD-10-CM

## 2022-05-04 DIAGNOSIS — M47816 Spondylosis without myelopathy or radiculopathy, lumbar region: Secondary | ICD-10-CM | POA: Diagnosis present

## 2022-05-04 DIAGNOSIS — M47817 Spondylosis without myelopathy or radiculopathy, lumbosacral region: Secondary | ICD-10-CM | POA: Diagnosis present

## 2022-05-04 MED ORDER — LIDOCAINE HCL 2 % IJ SOLN
20.0000 mL | Freq: Once | INTRAMUSCULAR | Status: AC
  Administered 2022-05-04: 400 mg
  Filled 2022-05-04: qty 20

## 2022-05-04 MED ORDER — TRIAMCINOLONE ACETONIDE 40 MG/ML IJ SUSP
80.0000 mg | Freq: Once | INTRAMUSCULAR | Status: AC
  Administered 2022-05-04: 80 mg
  Filled 2022-05-04: qty 2

## 2022-05-04 MED ORDER — LACTATED RINGERS IV SOLN: Freq: Once | INTRAVENOUS | Status: DC

## 2022-05-04 MED ORDER — ROPIVACAINE HCL 2 MG/ML IJ SOLN
18.0000 mL | Freq: Once | INTRAMUSCULAR | Status: AC
  Administered 2022-05-04: 18 mL via PERINEURAL
  Filled 2022-05-04: qty 20

## 2022-05-04 MED ORDER — PENTAFLUOROPROP-TETRAFLUOROETH EX AERO: INHALATION_SPRAY | Freq: Once | CUTANEOUS | Status: DC

## 2022-05-04 MED ORDER — MIDAZOLAM HCL 2 MG/2ML IJ SOLN: 0.5000 mg | Freq: Once | INTRAMUSCULAR | Status: DC

## 2022-05-04 NOTE — Progress Notes (Addendum)
PROVIDER NOTE: Interpretation of information contained herein should be left to medically-trained personnel. Specific patient instructions are provided elsewhere under "Patient Instructions" section of medical record. This document was created in part using STT-dictation technology, any transcriptional errors that may result from this process are unintentional.  Patient: Shelley Mahoney Type: Established DOB: 1932-01-29 MRN: [id number] PCP: Kirk Ruths, MD  Service: Procedure DOS: 05/04/2022 Setting: Ambulatory Location: Ambulatory outpatient facility Delivery: Face-to-face Provider: Gaspar Cola, MD Specialty: Interventional Pain Management Specialty designation: 09 Location: Outpatient facility Ref. Prov.: Milinda Pointer, MD    Procedure:           Type: Lumbar Facet, Medial Branch Block(s) #1  Laterality: Bilateral  Level: L2, L3, L4, L5, and S1 Medial Branch Level(s). Injecting these levels blocks the L3-4, L4-5, and L5-S1 lumbar facet joints. Imaging: Fluoroscopic guidance         Anesthesia: Local anesthesia (1-2% Lidocaine) Anxiolysis: none Sedation: No Sedation                       DOS: 05/04/2022 Performed by: Gaspar Cola, MD  Primary Purpose: Diagnostic/Therapeutic Indications: Low back pain severe enough to impact quality of life or function. 1. Lumbosacral facet syndrome   2. Spondylosis without myelopathy or radiculopathy, lumbosacral region   3. Grade 1 Anterolisthesis of lumbosacral spine (L5/S1)   4. Lumbar facet joint pain   5. Lumbar facet joint osteoarthritis   6. DDD (degenerative disc disease), lumbar   7. Chronic low back pain (1ry area of Pain) (Bilateral) (R=L) w/o sciatica   8. Abnormal CT scan, lumbar spine (10/28/2019)    NAS-11 Pain score:   Pre-procedure: 5 /10   Post-procedure: 0-No pain/10     Position / Prep / Materials:  Position: Prone  Prep solution: DuraPrep (Iodine Povacrylex [0.7% available iodine] and  Isopropyl Alcohol, 74% w/w) Area Prepped: Posterolateral Lumbosacral Spine (Wide prep: From the lower border of the scapula down to the end of the tailbone and from flank to flank.)  Materials:  Tray: Block Needle(s):  Type: Spinal  Gauge (G): 22  Length: 3.5-in Qty: 4     Pre-op H&P Assessment:  Shelley Mahoney is a 88 y.o. (year old), female patient, seen today for interventional treatment. She  has a past surgical history that includes Thyroid surgery; Colonoscopy with propofol (N/A, 12/08/2014); Flexible bronchoscopy (N/A, 04/21/2016); Eye surgery; and Abdominal hysterectomy. Shelley Mahoney has a current medication list which includes the following prescription(s): aspirin, cholecalciferol, gabapentin, gabapentin, iron, levothyroxine, magnesium, multivitamin, preservision areds, pramipexole, primidone, probiotic product, and simvastatin, and the following Facility-Administered Medications: pentafluoroprop-tetrafluoroeth. Her primarily concern today is the Back Pain (lower)  Initial Vital Signs:  Pulse/HCG Rate: 76ECG Heart Rate: 73 Temp: (!) 97.2 F (36.2 C) Resp: 14 BP: 119/61 SpO2: 100 %  BMI: Estimated body mass index is 27.57 kg/m as calculated from the following:   Height as of this encounter: 5\' 7"  (1.702 m).   Weight as of this encounter: 176 lb (79.8 kg).  Risk Assessment: Allergies: Reviewed. She has No Known Allergies.  Allergy Precautions: None required Coagulopathies: Reviewed. None identified.  Blood-thinner therapy: None at this time Active Infection(s): Reviewed. None identified. Shelley Mahoney is afebrile  Site Confirmation: Shelley Mahoney was asked to confirm the procedure and laterality before marking the site Procedure checklist: Completed Consent: Before the procedure and under the influence of no sedative(s), amnesic(s), or anxiolytics, the patient was informed of the treatment options, risks and possible complications. To  fulfill our ethical and legal obligations, as  recommended by the American Medical Association's Code of Ethics, I have informed the patient of my clinical impression; the nature and purpose of the treatment or procedure; the risks, benefits, and possible complications of the intervention; the alternatives, including doing nothing; the risk(s) and benefit(s) of the alternative treatment(s) or procedure(s); and the risk(s) and benefit(s) of doing nothing. The patient was provided information about the general risks and possible complications associated with the procedure. These may include, but are not limited to: failure to achieve desired goals, infection, bleeding, organ or nerve damage, allergic reactions, paralysis, and death. In addition, the patient was informed of those risks and complications associated to Spine-related procedures, such as failure to decrease pain; infection (i.e.: Meningitis, epidural or intraspinal abscess); bleeding (i.e.: epidural hematoma, subarachnoid hemorrhage, or any other type of intraspinal or peri-dural bleeding); organ or nerve damage (i.e.: Any type of peripheral nerve, nerve root, or spinal cord injury) with subsequent damage to sensory, motor, and/or autonomic systems, resulting in permanent pain, numbness, and/or weakness of one or several areas of the body; allergic reactions; (i.e.: anaphylactic reaction); and/or death. Furthermore, the patient was informed of those risks and complications associated with the medications. These include, but are not limited to: allergic reactions (i.e.: anaphylactic or anaphylactoid reaction(s)); adrenal axis suppression; blood sugar elevation that in diabetics may result in ketoacidosis or comma; water retention that in patients with history of congestive heart failure may result in shortness of breath, pulmonary edema, and decompensation with resultant heart failure; weight gain; swelling or edema; medication-induced neural toxicity; particulate matter embolism and blood vessel  occlusion with resultant organ, and/or nervous system infarction; and/or aseptic necrosis of one or more joints. Finally, the patient was informed that Medicine is not an exact science; therefore, there is also the possibility of unforeseen or unpredictable risks and/or possible complications that may result in a catastrophic outcome. The patient indicated having understood very clearly. We have given the patient no guarantees and we have made no promises. Enough time was given to the patient to ask questions, all of which were answered to the patient's satisfaction. Ms. Banbury has indicated that she wanted to continue with the procedure. Attestation: I, the ordering provider, attest that I have discussed with the patient the benefits, risks, side-effects, alternatives, likelihood of achieving goals, and potential problems during recovery for the procedure that I have provided informed consent. Date  Time: 05/04/2022  9:27 AM  Pre-Procedure Preparation:  Monitoring: As per clinic protocol. Respiration, ETCO2, SpO2, BP, heart rate and rhythm monitor placed and checked for adequate function Safety Precautions: Patient was assessed for positional comfort and pressure points before starting the procedure. Time-out: I initiated and conducted the "Time-out" before starting the procedure, as per protocol. The patient was asked to participate by confirming the accuracy of the "Time Out" information. Verification of the correct person, site, and procedure were performed and confirmed by me, the nursing staff, and the patient. "Time-out" conducted as per Joint Commission's Universal Protocol (UP.01.01.01). Time: 1022  Description of Procedure:          Laterality: Bilateral. The procedure was performed in identical fashion on both sides. Targeted Levels: L3, L4, L5, and S1  Medial Branch Level(s)  Safety Precautions: Aspiration looking for blood return was conducted prior to all injections. At no point did we  inject any substances, as a needle was being advanced. Before injecting, the patient was told to immediately notify me if she was  experiencing any new onset of "ringing in the ears, or metallic taste in the mouth". No attempts were made at seeking any paresthesias. Safe injection practices and needle disposal techniques used. Medications properly checked for expiration dates. SDV (single dose vial) medications used. After the completion of the procedure, all disposable equipment used was discarded in the proper designated medical waste containers. Local Anesthesia: Protocol guidelines were followed. The patient was positioned over the fluoroscopy table. The area was prepped in the usual manner. The time-out was completed. The target area was identified using fluoroscopy. A 12-in long, straight, sterile hemostat was used with fluoroscopic guidance to locate the targets for each level blocked. Once located, the skin was marked with an approved surgical skin marker. Once all sites were marked, the skin (epidermis, dermis, and hypodermis), as well as deeper tissues (fat, connective tissue and muscle) were infiltrated with a small amount of a short-acting local anesthetic, loaded on a 10cc syringe with a 25G, 1.5-in  Needle. An appropriate amount of time was allowed for local anesthetics to take effect before proceeding to the next step. Local Anesthetic: Lidocaine 2.0% The unused portion of the local anesthetic was discarded in the proper designated containers. Technical description of process:   L3 Medial Branch Nerve Block (MBB): The target area for the L3 medial branch is at the junction of the postero-lateral aspect of the superior articular process and the superior, posterior, and medial edge of the transverse process of L4. Under fluoroscopic guidance, a Quincke needle was inserted until contact was made with os over the superior postero-lateral aspect of the pedicular shadow (target area). After negative  aspiration for blood, 0.5 mL of the nerve block solution was injected without difficulty or complication. The needle was removed intact. L4 Medial Branch Nerve Block (MBB): The target area for the L4 medial branch is at the junction of the postero-lateral aspect of the superior articular process and the superior, posterior, and medial edge of the transverse process of L5. Under fluoroscopic guidance, a Quincke needle was inserted until contact was made with os over the superior postero-lateral aspect of the pedicular shadow (target area). After negative aspiration for blood, 0.5 mL of the nerve block solution was injected without difficulty or complication. The needle was removed intact. L5 Medial Branch Nerve Block (MBB): The target area for the L5 medial branch is at the junction of the postero-lateral aspect of the superior articular process and the superior, posterior, and medial edge of the sacral ala. Under fluoroscopic guidance, a Quincke needle was inserted until contact was made with os over the superior postero-lateral aspect of the pedicular shadow (target area). After negative aspiration for blood, 0.5 mL of the nerve block solution was injected without difficulty or complication. The needle was removed intact. S1 Medial Branch Nerve Block (MBB): The target area for the S1 medial branch is at the posterior and inferior 6 o'clock position of the L5-S1 facet joint. Under fluoroscopic guidance, the Quincke needle inserted for the L5 MBB was redirected until contact was made with os over the inferior and postero aspect of the sacrum, at the 6 o' clock position under the L5-S1 facet joint (Target area). After negative aspiration for blood, 0.5 mL of the nerve block solution was injected without difficulty or complication. The needle was removed intact.  Once the entire procedure was completed, the treated area was cleaned, making sure to leave some of the prepping solution back to take advantage of its  long term bactericidal properties.  Illustration of the posterior view of the lumbar spine and the posterior neural structures. Laminae of L2 through S1 are labeled. DPRL5, dorsal primary ramus of L5; DPRS1, dorsal primary ramus of S1; DPR3, dorsal primary ramus of L3; FJ, facet (zygapophyseal) joint L3-L4; I, inferior articular process of L4; LB1, lateral branch of dorsal primary ramus of L1; IAB, inferior articular branches from L3 medial branch (supplies L4-L5 facet joint); IBP, intermediate branch plexus; MB3, medial branch of dorsal primary ramus of L3; NR3, third lumbar nerve root; S, superior articular process of L5; SAB, superior articular branches from L4 (supplies L4-5 facet joint also); TP3, transverse process of L3.  Vitals:   05/04/22 1020 05/04/22 1023 05/04/22 1027 05/04/22 1031  BP: 120/66 121/62 118/67 122/65  Pulse:      Resp: 12 15 11 11   Temp:      TempSrc:      SpO2: 98% 98% 100% 100%  Weight:      Height:         Start Time: 1022 hrs. End Time: 1030 hrs.  Imaging Guidance (Spinal):          Type of Imaging Technique: Fluoroscopy Guidance (Spinal) Indication(s): Assistance in needle guidance and placement for procedures requiring needle placement in or near specific anatomical locations not easily accessible without such assistance. Exposure Time: Please see nurses notes. Contrast: None used. Fluoroscopic Guidance: I was personally present during the use of fluoroscopy. "Tunnel Vision Technique" used to obtain the best possible view of the target area. Parallax error corrected before commencing the procedure. "Direction-depth-direction" technique used to introduce the needle under continuous pulsed fluoroscopy. Once target was reached, antero-posterior, oblique, and lateral fluoroscopic projection used confirm needle placement in all planes. Images permanently stored in EMR. Interpretation: No contrast injected. I personally interpreted the imaging  intraoperatively. Adequate needle placement confirmed in multiple planes. Permanent images saved into the patient's record.  Antibiotic Prophylaxis:   Anti-infectives (From admission, onward)    None      Indication(s): None identified  Post-operative Assessment:  Post-procedure Vital Signs:  Pulse/HCG Rate: 7672 Temp: (!) 97.2 F (36.2 C) Resp: 11 BP: 122/65 SpO2: 100 %  EBL: None  Complications: No immediate post-treatment complications observed by team, or reported by patient.  Note: The patient tolerated the entire procedure well. A repeat set of vitals were taken after the procedure and the patient was kept under observation following institutional policy, for this type of procedure. Post-procedural neurological assessment was performed, showing return to baseline, prior to discharge. The patient was provided with post-procedure discharge instructions, including a section on how to identify potential problems. Should any problems arise concerning this procedure, the patient was given instructions to immediately contact us, at any time, without hesitation. In any case, we plan to contact the patient by telephone for a follow-up status report regarding this interventional procedure.  Comments:  No additional relevant information.  Plan of Care  Orders:  Orders Placed This Encounter  Procedures   LUMBAR FACET(MEDIAL BRANCH NERVE BLOCK) MBNB    Scheduling Instructions:     Procedure: Lumbar facet block (AKA.: Lumbosacral medial branch nerve block)     Side: Bilateral     Level: L3-4 & L5-S1 Facets (L2, L3, L4, L5, & S1 Medial Branch Nerves)     Sedation: Patient's choice.     Timeframe: Today    Order Specific Question:   Where will this procedure be performed?    Answer:   [facility]  DG [facility] C-ARM 1-60 MIN NO REPORT    Intraoperative interpretation by procedural physician at [place].    Standing Status:   Standing    Number of  Occurrences:   1    Order Specific Question:   Reason for exam:    Answer:   Assistance in needle guidance and placement for procedures requiring needle placement in or near specific anatomical locations not easily accessible without such assistance.   Informed Consent Details: Physician/Practitioner Attestation; Transcribe to consent form and obtain patient signature    Nursing Order: Transcribe to consent form and obtain patient signature. Note: Always confirm laterality of pain with Ms. Jastrzebski, before procedure.    Order Specific Question:   Physician/Practitioner attestation of informed consent for procedure/surgical case    Answer:   I, the physician/practitioner, attest that I have discussed with the patient the benefits, risks, side effects, alternatives, likelihood of achieving goals and potential problems during recovery for the procedure that I have provided informed consent.    Order Specific Question:   Procedure    Answer:   Lumbar Facet Block  under fluoroscopic guidance    Order Specific Question:   Physician/Practitioner performing the procedure    Answer:   Alaja Goldinger A. Dossie Arbour MD    Order Specific Question:   Indication/Reason    Answer:   Low Back Pain, with our without leg pain, due to Facet Joint Arthralgia (Joint Pain) Spondylosis (Arthritis of the Spine), without myelopathy or radiculopathy (Nerve Damage).   Provide equipment / supplies at bedside    Procedure tray: "Block Tray" (Disposable  single use) Skin infiltration needle: Regular 1.5-in, 25-G, (x1) Block Needle type: Spinal Amount/quantity: 4 Size: Regular (3.5-inch) Gauge: 22G    Standing Status:   Standing    Number of Occurrences:   1    Order Specific Question:   Specify    Answer:   Block Tray   Chronic Opioid Analgesic:  None MME/day: 0 mg/day   Medications ordered for procedure: Meds ordered this encounter  Medications   lidocaine (XYLOCAINE) 2 % (with pres) injection 400 mg    pentafluoroprop-tetrafluoroeth (GEBAUERS) aerosol   DISCONTD: lactated ringers infusion   DISCONTD: midazolam (VERSED) injection 0.5-2 mg    Make sure Flumazenil is available in the pyxis when using this medication. If oversedation occurs, administer 0.2 mg IV over 15 sec. If after 45 sec no response, administer 0.2 mg again over 1 min; may repeat at 1 min intervals; not to exceed 4 doses (1 mg)   ropivacaine (PF) 2 mg/mL (0.2%) (NAROPIN) injection 18 mL   triamcinolone acetonide (KENALOG-40) injection 80 mg   Medications administered: We administered lidocaine, ropivacaine (PF) 2 mg/mL (0.2%), and triamcinolone acetonide.  See the medical record for exact dosing, route, and time of administration.  Follow-up plan:   Return in about 2 weeks (around 05/18/2022) for Proc-day (T,Th), (F2F), (PPE).       Interventional Therapies  Risk  Complexity Considerations:   Advanced age   Planned  Pending:   Diagnostic bilateral lumbar facet (L2-S1) MBB #1  (04/04/2022) EMG/PNCV of lower extremities report. Not available at the time of this evaluation (04/24/2022).   Under consideration:   Diagnostic bilateral lumbar facet MBB (L2-S1) #1  Possible bilateral lumbar facet RFA (L2-S1) #1  Possible bilateral spinal cord stimulator trial/implant    Completed:   None at this time   Completed by other providers:   Diagnostic/therapeutic bilateral S1 TFESI x5 (11/20/2019, 12/24/2019, 04/30/2020, 06/30/2020,  04/08/2021) by Girtha Hake, MD [facility] PMR)  Diagnostic bilateral L3, L4, and L5 MBB x2 (05/26/2021, 06/10/2021) by Girtha Hake, MD [facility] PMR)  Therapeutic bilateral L3, L4, and L5 MB RFA x1 (10/12/2021) by Sharlet Salina, DO [facility] PMR)    Therapeutic  Palliative (PRN) options:   None established     Recent Visits Date Type Provider Dept  04/24/22 Office Visit Milinda Pointer, MD [facility]  Showing recent visits within past 90 days and meeting all other requirements Today's  Visits Date Type Provider Dept  05/04/22 Procedure visit Milinda Pointer, MD [facility]  Showing today's visits and meeting all other requirements Future Appointments Date Type Provider Dept  05/23/22 Appointment Milinda Pointer, MD [facility]  Showing future appointments within next 90 days and meeting all other requirements  Disposition: Discharge home  Discharge (Date  Time): 05/04/2022; 1040 hrs.   Primary Care Physician: Kirk Ruths, MD Location: [facility] Note by: Gaspar Cola, MD Date: 05/04/2022; Time: 11:54 AM  Disclaimer:  Medicine is not an Chief Strategy Officer. The only guarantee in medicine is that nothing is guaranteed. It is important to note that the decision to proceed with this intervention was based on the information collected from the patient. The Data and conclusions were drawn from the patient's questionnaire, the interview, and the physical examination. Because the information was provided in large part by the patient, it cannot be guaranteed that it has not been purposely or unconsciously manipulated. Every effort has been made to obtain as much relevant data as possible for this evaluation. It is important to note that the conclusions that lead to this procedure are derived in large part from the available data. Always take into account that the treatment will also be dependent on availability of resources and existing treatment guidelines, considered by other Pain Management Practitioners as being common knowledge and practice, at the time of the intervention. For Medico-Legal purposes, it is also important to point out that variation in procedural techniques and pharmacological choices are the acceptable norm. The indications, contraindications, technique, and results of the above procedure should only be interpreted and judged by a Board-Certified Interventional Pain Specialist with extensive  familiarity and expertise in the same exact procedure and technique.

## 2022-05-04 NOTE — Patient Instructions (Signed)

## 2022-05-05 ENCOUNTER — Telehealth: Payer: Self-pay

## 2022-05-05 NOTE — Telephone Encounter (Signed)
Post procedure follow up.  Patinet states she is doing fine.

## 2022-05-23 ENCOUNTER — Ambulatory Visit: Payer: Medicare PPO | Admitting: Pain Medicine

## 2022-05-25 ENCOUNTER — Ambulatory Visit: Payer: Medicare PPO | Attending: Pain Medicine | Admitting: Pain Medicine

## 2022-05-25 ENCOUNTER — Encounter: Payer: Self-pay | Admitting: Pain Medicine

## 2022-05-25 VITALS — BP 140/67 | HR 88 | Temp 97.4°F | Resp 16 | Ht 67.5 in | Wt 175.0 lb

## 2022-05-25 DIAGNOSIS — M47816 Spondylosis without myelopathy or radiculopathy, lumbar region: Secondary | ICD-10-CM

## 2022-05-25 DIAGNOSIS — M5136 Other intervertebral disc degeneration, lumbar region: Secondary | ICD-10-CM

## 2022-05-25 DIAGNOSIS — R94130 Abnormal response to nerve stimulation, unspecified: Secondary | ICD-10-CM

## 2022-05-25 DIAGNOSIS — M47817 Spondylosis without myelopathy or radiculopathy, lumbosacral region: Secondary | ICD-10-CM

## 2022-05-25 DIAGNOSIS — M4317 Spondylolisthesis, lumbosacral region: Secondary | ICD-10-CM | POA: Diagnosis not present

## 2022-05-25 DIAGNOSIS — M545 Low back pain, unspecified: Secondary | ICD-10-CM

## 2022-05-25 DIAGNOSIS — R937 Abnormal findings on diagnostic imaging of other parts of musculoskeletal system: Secondary | ICD-10-CM | POA: Diagnosis present

## 2022-05-25 DIAGNOSIS — G8929 Other chronic pain: Secondary | ICD-10-CM

## 2022-05-25 DIAGNOSIS — M5459 Other low back pain: Secondary | ICD-10-CM

## 2022-05-25 NOTE — Progress Notes (Signed)
PROVIDER NOTE: Information contained herein reflects review and annotations entered in association with encounter. Interpretation of such information and data should be left to medically-trained personnel. Information provided to patient can be located elsewhere in the medical record under "Patient Instructions". Document created using STT-dictation technology, any transcriptional errors that may result from process are unintentional.    Patient: Shelley Mahoney  Service Category: E/M  Provider: Gaspar Cola, MD  DOB: March 20, 1932  DOS: 05/25/2022  Referring Provider: Kirk Ruths, MD  MRN: [id number]  Specialty: Interventional Pain Management  PCP: Kirk Ruths, MD  Type: Established Patient  Setting: Ambulatory outpatient    Location: Office  Delivery: Face-to-face     HPI  Ms. Shelley Mahoney, a 89 y.o. year old female, is here today because of her Chronic bilateral low back pain without sciatica [M54.50, G89.29]. Shelley Mahoney primary complain today is Other (Pain that starts at the waste on the right and goes down into the buttocks. ) Last encounter: My last encounter with her was on 05/04/2022. Pertinent problems: Shelley Mahoney has Mass of upper lobe of left lung; Primary cancer of left upper lobe of lung ([place]); Malignant neoplasm of upper lobe of left lung ([place]); Neuropathy; Lung cancer ([place]); Chronic low back pain (1ry area of Pain) (Bilateral) (R=L) w/o sciatica; Lumbar stenosis without neurogenic claudication; Status post deep brain stimulator placement; Strain of right knee; Chronic pain syndrome; Abnormal CT scan, lumbar spine (10/28/2019); Grade 1 Anterolisthesis of lumbosacral spine (L5/S1); Chronic mid back pain; Lumbar scoliosis due to degenerative disease of spine in adult; Lumbar spondylolisthesis (L5-S1 3 mm Anterolisthesis) (Right: L3-4 9 mm translation); Lumbar facet hypertrophy (Multilevel) (Bilateral); Lumbar facet joint osteoarthritis; DDD (degenerative disc  disease), lumbar; DJD (degenerative joint disease), lumbosacral; Ligamentum flavum hypertrophy (L2-3, L3-4, L4-5); Lumbosacral foraminal stenosis (Left: L2-3) (Bilateral: L3-4) (L>R: L4-5, L5-S1); Lumbosacral lateral recess stenosis (Left: L2-3) (Bilateral: L3-4, L4-5, L5-S1); Spondylosis without myelopathy or radiculopathy, lumbosacral region; Dextroscoliosis of lumbar spine; Lumbar facet joint pain; Lumbosacral facet syndrome; Chronic feet pain (2ry area of Pain) (Bilateral); Abnormal NCS (nerve conduction studies) (LE) (04/04/2022); and Sensory polyneuropathy (lower extremites) on their pertinent problem list. Pain Assessment: Severity of Chronic pain is reported as a 3 /10. Location: Buttocks (see visit info for pain) Right/? back into right buttocks. Onset: More than a month ago. Quality: Discomfort, Sharp, Constant. Timing: Constant. Modifying factor(s): nothing currently. Vitals:  height is 5' 7.5" (1.715 m) and weight is 175 lb (79.4 kg). Her temporal temperature is 97.4 F (36.3 C) (abnormal). Her blood pressure is 140/67 (abnormal) and her pulse is 88. Her respiration is 16 and oxygen saturation is 97%.  BMI: Estimated body mass index is 27 kg/m as calculated from the following:   Height as of this encounter: 5' 7.5" (1.715 m).   Weight as of this encounter: 175 lb (79.4 kg).  Reason for encounter: post-procedure evaluation and assessment.  The patient indicates having attained excellent relief of the pain with the diagnostic bilateral lumbar facet medial branch block #1.  At this point she refers having an ongoing 100% relief of the pain except for some discomfort that she is having in the right lower back around the PSIS area.  Today the patient asked about the results of her nerve conduction test which indicated that the patient has some severe peripheral polyneuropathy.  She denies having pain, but she confirms having numbness in both lower extremities.  She also refers having some disturbance  in her gait, which  at this point could be associated to that polyneuropathy, but it could also be secondary to deconditioning since she admits not having done a whole lot of walking secondary to the pain that she was experiencing.  At this point I have recommended to repeat the right-sided lumbar facet block and see if that provides her with some additional relief.  The patient did confirm that while the local anesthetic was in place, she was experiencing absolutely no pain in that area.  He was only when the local anesthetic wore off that she felt the discomfort, but she refers that it is nothing like it was before.  Post-procedure evaluation   Type: Lumbar Facet, Medial Branch Block(s) #1  Laterality: Bilateral  Level: L2, L3, L4, L5, and S1 Medial Branch Level(s). Injecting these levels blocks the L3-4, L4-5, and L5-S1 lumbar facet joints. Imaging: Fluoroscopic guidance         Anesthesia: Local anesthesia (1-2% Lidocaine) Anxiolysis: none Sedation: No Sedation                       DOS: 05/04/2022 Performed by: Gaspar Cola, MD  Primary Purpose: Diagnostic/Therapeutic Indications: Low back pain severe enough to impact quality of life or function. 1. Lumbosacral facet syndrome   2. Spondylosis without myelopathy or radiculopathy, lumbosacral region   3. Grade 1 Anterolisthesis of lumbosacral spine (L5/S1)   4. Lumbar facet joint pain   5. Lumbar facet joint osteoarthritis   6. DDD (degenerative disc disease), lumbar   7. Chronic low back pain (1ry area of Pain) (Bilateral) (R=L) w/o sciatica   8. Abnormal CT scan, lumbar spine (10/28/2019)    NAS-11 Pain score:   Pre-procedure: 5 /10   Post-procedure: 0-No pain/10      Effectiveness:  Initial hour after procedure: 100 %. Subsequent 4-6 hours post-procedure: 100 %. Analgesia past initial 6 hours: 100 %. Ongoing improvement:  Analgesic: The patient refers having an ongoing 100% relief of the pain in the lower back except  for some discomfort around the PSIS area on the right side. Function: Shelley Mahoney reports improvement in function ROM: Shelley Mahoney reports improvement in ROM  Pharmacotherapy Assessment  Analgesic: None MME/day: 0 mg/day   Monitoring: [place] PMP: PDMP reviewed during this encounter.       Pharmacotherapy: No side-effects or adverse reactions reported. Compliance: No problems identified. Effectiveness: Clinically acceptable.  Janett Billow, RN  05/25/2022  1:33 PM  Sign when Signing Visit Safety precautions to be maintained throughout the outpatient stay will include: orient to surroundings, keep bed in low position, maintain call bell within reach at all times, provide assistance with transfer out of bed and ambulation.     No results found for: "CBDTHCR" No results found for: "D8THCCBX" No results found for: "D9THCCBX"  UDS:  Summary  Date Value Ref Range Status  02/01/2022 Note  Final    Comment:    ==================================================================== Compliance Drug Analysis, Ur ==================================================================== Test                             Result       Flag       Units  Drug Present and Declared for Prescription Verification   Primidone                      PRESENT      EXPECTED   Phenobarbital  PRESENT      EXPECTED    Phenobarbital is an expected metabolite of primidone; Phenobarbital    may also be administered as a prescription drug.    Gabapentin                     PRESENT      EXPECTED  Drug Present not Declared for Prescription Verification   Acetaminophen                  PRESENT      UNEXPECTED  Drug Absent but Declared for Prescription Verification   Salicylate                     Not Detected UNEXPECTED    Aspirin, as indicated in the declared medication list, is not always    detected even when used as  directed.  ==================================================================== Test                      Result    Flag   Units      Ref Range   Creatinine              28               mg/dL      >=20 ==================================================================== Declared Medications:  The flagging and interpretation on this report are based on the  following declared medications.  Unexpected results may arise from  inaccuracies in the declared medications.   **Note: The testing scope of this panel includes these medications:   Gabapentin (Neurontin)  Primidone (Mysoline)   **Note: The testing scope of this panel does not include small to  moderate amounts of these reported medications:   Aspirin   **Note: The testing scope of this panel does not include the  following reported medications:   Iron  Levothyroxine (Synthroid)  Magnesium  Multivitamin  Pramipexole (Mirapex)  Probiotic  Simvastatin (Zocor)  Supplement (PreserVision)  Vitamin D ==================================================================== For clinical consultation, please call [phone]. ====================================================================       ROS  Constitutional: Denies any fever or chills Gastrointestinal: No reported hemesis, hematochezia, vomiting, or acute GI distress Musculoskeletal: Denies any acute onset joint swelling, redness, loss of ROM, or weakness Neurological: No reported episodes of acute onset apraxia, aphasia, dysarthria, agnosia, amnesia, paralysis, loss of coordination, or loss of consciousness  Medication Review  Aspirin, Iron, Magnesium, PreserVision AREDS, Probiotic Product, cholecalciferol, gabapentin, levothyroxine, multivitamin, pramipexole, primidone, and simvastatin  History Review  Allergy: Shelley Mahoney has No Known Allergies. Drug: Shelley Mahoney  reports no history of drug use. Alcohol:  reports no history of alcohol use. Tobacco:   reports that she has never smoked. She has never used smokeless tobacco. Social: Shelley Mahoney  reports that she has never smoked. She has never used smokeless tobacco. She reports that she does not drink alcohol and does not use drugs. Medical:  has a past medical history of Carotid stenosis, Carotid stenosis, Chronic kidney disease, CKD (chronic kidney disease), Dysplastic nevus (09/22/2019), Hypothyroidism, Lung cancer ([place]) (2018), Neuro-degenerative disorders ([place]), Personal history of radiation therapy, and Uterine cancer ([place]). Surgical: Shelley Mahoney  has a past surgical history that includes Thyroid surgery; Colonoscopy with propofol (N/A, 12/08/2014); Flexible bronchoscopy (N/A, 04/21/2016); Eye surgery; and Abdominal hysterectomy. Family: family history includes Breast cancer in her mother; Colon cancer in her maternal grandmother; Other in her father.  Laboratory Chemistry Profile   Renal Lab Results  Component Value  Date   BUN 23 02/01/2022   CREATININE 1.00 02/01/2022   BCR 23 02/01/2022   GFRAA >60 04/28/2016   GFRNONAA 47 (L) 04/09/2021    Hepatic Lab Results  Component Value Date   AST 21 02/01/2022   ALT 23 04/09/2021   ALBUMIN 4.4 02/01/2022   ALKPHOS 74 02/01/2022   LIPASE 41 04/09/2021    Electrolytes Lab Results  Component Value Date   NA 138 02/01/2022   K 4.7 02/01/2022   CL 102 02/01/2022   CALCIUM 8.9 02/01/2022   MG 2.6 (H) 02/01/2022    Bone Lab Results  Component Value Date   25OHVITD1 47 02/01/2022   25OHVITD2 <1.0 02/01/2022   25OHVITD3 47 02/01/2022    Inflammation (CRP: Acute Phase) (ESR: Chronic Phase) Lab Results  Component Value Date   CRP 1 02/01/2022   ESRSEDRATE 20 02/01/2022         Note: Above Lab results reviewed.  Recent Imaging Review  DG PAIN CLINIC C-ARM 1-60 MIN NO REPORT Fluoro was used, but no Radiologist interpretation will be provided.  Please refer to "NOTES" tab for provider progress note. Note: Reviewed         Physical Exam  General appearance: Well nourished, well developed, and well hydrated. In no apparent acute distress Mental status: Alert, oriented x 3 (person, place, & time)       Respiratory: No evidence of acute respiratory distress Eyes: PERLA Vitals: BP (!) 140/67 (BP Location: Right Arm, Patient Position: Sitting, Cuff Size: Normal)   Pulse 88   Temp (!) 97.4 F (36.3 C) (Temporal)   Resp 16   Ht 5' 7.5" (1.715 m)   Wt 175 lb (79.4 kg)   SpO2 97%   BMI 27.00 kg/m  BMI: Estimated body mass index is 27 kg/m as calculated from the following:   Height as of this encounter: 5' 7.5" (1.715 m).   Weight as of this encounter: 175 lb (79.4 kg). Ideal: Ideal body weight: 62.7 kg (138 lb 5.4 oz) Adjusted ideal body weight: 69.4 kg (153 lb)  Assessment   Diagnosis Status  1. Chronic low back pain (1ry area of Pain) (Bilateral) (R=L) w/o sciatica   2. Lumbosacral facet syndrome   3. Lumbar facet joint pain   4. Spondylosis without myelopathy or radiculopathy, lumbosacral region   5. Grade 1 Anterolisthesis of lumbosacral spine (L5/S1)   6. Lumbar facet joint osteoarthritis   7. DDD (degenerative disc disease), lumbar   8. Lumbar facet hypertrophy (Multilevel) (Bilateral)   9. Abnormal CT scan, lumbar spine (10/28/2019)   10. Abnormal NCS (nerve conduction studies) (LE) (04/04/2022)    Controlled Controlled Controlled   Updated Problems: No problems updated.  Plan of Care  Problem-specific:  No problem-specific Assessment & Plan notes found for this encounter.  Shelley Mahoney has a current medication list which includes the following long-term medication(s): levothyroxine, pramipexole, primidone, and simvastatin.  Pharmacotherapy (Medications Ordered): No orders of the defined types were placed in this encounter.  Orders:  Orders Placed This Encounter  Procedures   LUMBAR FACET(MEDIAL BRANCH NERVE BLOCK) MBNB    Standing Status:   Future    Standing  Expiration Date:   08/24/2022    Scheduling Instructions:     Procedure: Lumbar facet block (AKA.: Lumbosacral medial branch nerve block)     Side: Right-sided     Level: L3-4, L4-5, and L5-S1 Facets (L2, L3, L4, L5, and S1 Medial Branch)     Sedation: Patient's  choice.     Timeframe: ASAA    Order Specific Question:   Where will this procedure be performed?    Answer:   [facility]   Follow-up plan:   Return for (Clinic): (R) L-FCT Blk #2.     Interventional Therapies  Risk  Complexity Considerations:   Advanced age   Planned  Pending:   Diagnostic right lumbar facet (L2-S1) MBB #2  (04/04/2022) EMG/PNCV of lower extremities report. Not available at the time of this evaluation (04/24/2022).   Under consideration:   Diagnostic bilateral lumbar facet MBB (L2-S1) #2  Possible bilateral lumbar facet RFA (L2-S1) #1  Possible bilateral spinal cord stimulator trial/implant    Completed:   Diagnostic bilateral lumbar facet MBB (L2-S1) x1 (05/04/2022) (100/100/100/100)    Completed by other providers:   Diagnostic/therapeutic bilateral S1 TFESI x5 (11/20/2019, 12/24/2019, 04/30/2020, 06/30/2020, 04/08/2021) by Girtha Hake, MD [facility])  Diagnostic bilateral L3, L4, and L5 MBB x2 (05/26/2021, 06/10/2021) by Girtha Hake, MD [facility])  Therapeutic bilateral L3, L4, and L5 MB RFA x1 (10/12/2021) by Sharlet Salina, DO [facility] PMR)    Therapeutic  Palliative (PRN) options:   None established      Recent Visits Date Type Provider Dept  05/04/22 Procedure visit Milinda Pointer, MD [facility]  04/24/22 Office Visit Milinda Pointer, MD [facility]  Showing recent visits within past 90 days and meeting all other requirements Today's Visits Date Type Provider Dept  05/25/22 Office Visit Milinda Pointer, MD [facility]  Showing today's visits and meeting all other requirements Future Appointments No visits were found meeting these  conditions. Showing future appointments within next 90 days and meeting all other requirements  I discussed the assessment and treatment plan with the patient. The patient was provided an opportunity to ask questions and all were answered. The patient agreed with the plan and demonstrated an understanding of the instructions.  Patient advised to call back or seek an in-person evaluation if the symptoms or condition worsens.  Duration of encounter: 30 minutes.  Total time on encounter, as per AMA guidelines included both the face-to-face and non-face-to-face time personally spent by the physician and/or other qualified health care professional(s) on the day of the encounter (includes time in activities that require the physician or other qualified health care professional and does not include time in activities normally performed by clinical staff). Physician's time may include the following activities when performed: Preparing to see the patient (e.g., pre-charting review of records, searching for previously ordered imaging, lab work, and nerve conduction tests) Review of prior analgesic pharmacotherapies. Reviewing PMP Interpreting ordered tests (e.g., lab work, imaging, nerve conduction tests) Performing post-procedure evaluations, including interpretation of diagnostic procedures Obtaining and/or reviewing separately obtained history Performing a medically appropriate examination and/or evaluation Counseling and educating the patient/family/caregiver Ordering medications, tests, or procedures Referring and communicating with other health care professionals (when not separately reported) Documenting clinical information in the electronic or other health record Independently interpreting results (not separately reported) and communicating results to the patient/ family/caregiver Care coordination (not separately reported)  Note by: Gaspar Cola, MD Date: 05/25/2022; Time: 1:53 PM

## 2022-05-25 NOTE — Progress Notes (Signed)
Safety precautions to be maintained throughout the outpatient stay will include: orient to surroundings, keep bed in low position, maintain call bell within reach at all times, provide assistance with transfer out of bed and ambulation.  

## 2022-05-25 NOTE — Patient Instructions (Addendum)
Facet Blocks Patient Information  Description: The facets are joints in the spine between the vertebrae.  Like any joints in the body, facets can become irritated and painful.  Arthritis can also effect the facets.  By injecting steroids and local anesthetic in and around these joints, we can temporarily block the nerve supply to them.  Steroids act directly on irritated nerves and tissues to reduce selling and inflammation which often leads to decreased pain.  Facet blocks may be done anywhere along the spine from the neck to the low back depending upon the location of your pain.   After numbing the skin with local anesthetic (like Novocaine), a small needle is passed onto the facet joints under x-ray guidance.  You may experience a sensation of pressure while this is being done.  The entire block usually lasts about 15-25 minutes.   Conditions which may be treated by facet blocks:  Low back/buttock pain Neck/shoulder pain Certain types of headaches  Preparation for the injection:  Do not eat any solid food or dairy products within 8 hours of your appointment. You may drink clear liquid up to 3 hours before appointment.  Clear liquids include water, black coffee, juice or soda.  No milk or cream please. You may take your regular medication, including pain medications, with a sip of water before your appointment.  Diabetics should hold regular insulin (if taken separately) and take 1/2 normal NPH dose the morning of the procedure.  Carry some sugar containing items with you to your appointment. A driver must accompany you and be prepared to drive you home after your procedure. Bring all your current medications with you. An IV may be inserted and sedation may be given at the discretion of the physician. A blood pressure cuff, EKG and other monitors will often be applied during the procedure.  Some patients may need to have extra oxygen administered for a short period. You will be asked to  provide medical information, including your allergies and medications, prior to the procedure.  We must know immediately if you are taking blood thinners (like Coumadin/Warfarin) or if you are allergic to IV iodine contrast (dye).  We must know if you could possible be pregnant.  Possible side-effects:  Bleeding from needle site Infection (rare, may require surgery) Nerve injury (rare) Numbness & tingling (temporary) Difficulty urinating (rare, temporary) Spinal headache (a headache worse with upright posture) Light-headedness (temporary) Pain at injection site (serveral days) Decreased blood pressure (rare, temporary) Weakness in arm/leg (temporary) Pressure sensation in back/neck (temporary)   Call if you experience:  Fever/chills associated with headache or increased back/neck pain Headache worsened by an upright position New onset, weakness or numbness of an extremity below the injection site Hives or difficulty breathing (go to the emergency room) Inflammation or drainage at the injection site(s) Severe back/neck pain greater than usual New symptoms which are concerning to you  Please note:  Although the local anesthetic injected can often make your back or neck feel good for several hours after the injection, the pain will likely return. It takes 3-7 days for steroids to work.  You may not notice any pain relief for at least one week.  If effective, we will often do a series of 2-3 injections spaced 3-6 weeks apart to maximally decrease your pain.  After the initial series, you may be a candidate for a more permanent nerve block of the facets.  If you have any questions, please call #[phone]) [facility]  Pain Clinic ______________________________________________________________________  Preparing for your procedure  During your procedure appointment there will be: No Prescription Refills. No disability issues to discussed. No medication  changes or discussions.  Instructions: Food intake: Avoid eating anything solid for at least 8 hours prior to your procedure. Clear liquid intake: You may take clear liquids such as water up to 2 hours prior to your procedure. (No carbonated drinks. No soda.) Transportation: Unless otherwise stated by your physician, bring a driver. Morning Medicines: Except for blood thinners, take all of your other morning medications with a sip of water. Make sure to take your heart and blood pressure medicines. If your blood pressure's lower number is above 100, the case will be rescheduled. Blood thinners: If you take a blood thinner, but were not instructed to stop it, call our office [phone] and ask to talk to a nurse. Not stopping a blood thinner prior to certain procedures could lead to serious complications. Diabetics on insulin: Notify the staff so that you can be scheduled 1st case in the morning. If your diabetes requires high dose insulin, take only  of your normal insulin dose the morning of the procedure and notify the staff that you have done so. Preventing infections: Shower with an antibacterial soap the morning of your procedure.  Build-up your immune system: Take 1000 mg of Vitamin C with every meal (3 times a day) the day prior to your procedure. Antibiotics: Inform the nursing staff if you are taking any antibiotics or if you have any conditions that may require antibiotics prior to procedures. (Example: recent joint implants)   Pregnancy: If you are pregnant make sure to notify the nursing staff. Not doing so may result in injury to the fetus, including death.  Sickness: If you have a cold, fever, or any active infections, call and cancel or reschedule your procedure. Receiving steroids while having an infection may result in complications. Arrival: You must be in the facility at least 30 minutes prior to your scheduled procedure. Tardiness: Your scheduled time is also the cutoff time.  If you do not arrive at least 15 minutes prior to your procedure, you will be rescheduled.  Children: Do not bring any children with you. Make arrangements to keep them home. Dress appropriately: There is always a possibility that your clothing may get soiled. Avoid long dresses. Valuables: Do not bring any jewelry or valuables.  Reasons to call and reschedule or cancel your procedure: (Following these recommendations will minimize the risk of a serious complication.) Surgeries: Avoid having procedures within 2 weeks of any surgery. (Avoid for 2 weeks before or after any surgery). Flu Shots: Avoid having procedures within 2 weeks of a flu shots or . (Avoid for 2 weeks before or after immunizations). Barium: Avoid having a procedure within 7-10 days after having had a radiological study involving the use of radiological contrast. (Myelograms, Barium swallow or enema study). Heart attacks: Avoid any elective procedures or surgeries for the initial 6 months after a "Myocardial Infarction" (Heart Attack). Blood thinners: It is imperative that you stop these medications before procedures. Let us know if you if you take any blood thinner.  Infection: Avoid procedures during or within two weeks of an infection (including chest colds or gastrointestinal problems). Symptoms associated with infections include: Localized redness, fever, chills, night sweats or profuse sweating, burning sensation when voiding, cough, congestion, stuffiness, runny nose, sore throat, diarrhea, nausea, vomiting, cold or Flu symptoms, recent or current infections. It is specially important if  the infection is over the area that we intend to treat. Heart and lung problems: Symptoms that may suggest an active cardiopulmonary problem include: cough, chest pain, breathing difficulties or shortness of breath, dizziness, ankle swelling, uncontrolled high or unusually low blood pressure, and/or palpitations. If you are experiencing any of  these symptoms, cancel your procedure and contact your primary care physician for an evaluation.  Remember:  Regular Business hours are:  Monday to Thursday 8:00 AM to 4:00 PM  Provider's Schedule: Milinda Pointer, MD:  Procedure days: Tuesday and Thursday 7:30 AM to 4:00 PM  Gillis Santa, MD:  Procedure days: Monday and Wednesday 7:30 AM to 4:00 PM  ______________________________________________________________________    ____________________________________________________________________________________________  General Risks and Possible Complications  Patient Responsibilities: It is important that you read this as it is part of your informed consent. It is our duty to inform you of the risks and possible complications associated with treatments offered to you. It is your responsibility as a patient to read this and to ask questions about anything that is not clear or that you believe was not covered in this document.  Patient's Rights: You have the right to refuse treatment. You also have the right to change your mind, even after initially having agreed to have the treatment done. However, under this last option, if you wait until the last second to change your mind, you may be charged for the materials used up to that point.  Introduction: Medicine is not an Chief Strategy Officer. Everything in Medicine, including the lack of treatment(s), carries the potential for danger, harm, or loss (which is by definition: Risk). In Medicine, a complication is a secondary problem, condition, or disease that can aggravate an already existing one. All treatments carry the risk of possible complications. The fact that a side effects or complications occurs, does not imply that the treatment was conducted incorrectly. It must be clearly understood that these can happen even when everything is done following the highest safety standards.  No treatment: You can choose not to proceed with the proposed  treatment alternative. The "PRO(s)" would include: avoiding the risk of complications associated with the therapy. The "CON(s)" would include: not getting any of the treatment benefits. These benefits fall under one of three categories: diagnostic; therapeutic; and/or palliative. Diagnostic benefits include: getting information which can ultimately lead to improvement of the disease or symptom(s). Therapeutic benefits are those associated with the successful treatment of the disease. Finally, palliative benefits are those related to the decrease of the primary symptoms, without necessarily curing the condition (example: decreasing the pain from a flare-up of a chronic condition, such as incurable terminal cancer).  General Risks and Complications: These are associated to most interventional treatments. They can occur alone, or in combination. They fall under one of the following six (6) categories: no benefit or worsening of symptoms; bleeding; infection; nerve damage; allergic reactions; and/or death. No benefits or worsening of symptoms: In Medicine there are no guarantees, only probabilities. No healthcare provider can ever guarantee that a medical treatment will work, they can only state the probability that it may. Furthermore, there is always the possibility that the condition may worsen, either directly, or indirectly, as a consequence of the treatment. Bleeding: This is more common if the patient is taking a blood thinner, either prescription or over the counter (example: Goody Powders, Fish oil, Aspirin, Garlic, etc.), or if suffering a condition associated with impaired coagulation (example: Hemophilia, cirrhosis of the liver, low platelet counts, etc.).  However, even if you do not have one on these, it can still happen. If you have any of these conditions, or take one of these drugs, make sure to notify your treating physician. Infection: This is more common in patients with a compromised immune  system, either due to disease (example: diabetes, cancer, human immunodeficiency virus [HIV], etc.), or due to medications or treatments (example: therapies used to treat cancer and rheumatological diseases). However, even if you do not have one on these, it can still happen. If you have any of these conditions, or take one of these drugs, make sure to notify your treating physician. Nerve Damage: This is more common when the treatment is an invasive one, but it can also happen with the use of medications, such as those used in the treatment of cancer. The damage can occur to small secondary nerves, or to large primary ones, such as those in the spinal cord and brain. This damage may be temporary or permanent and it may lead to impairments that can range from temporary numbness to permanent paralysis and/or brain death. Allergic Reactions: Any time a substance or material comes in contact with our body, there is the possibility of an allergic reaction. These can range from a mild skin rash (contact dermatitis) to a severe systemic reaction (anaphylactic reaction), which can result in death. Death: In general, any medical intervention can result in death, most of the time due to an unforeseen complication. ____________________________________________________________________________________________

## 2022-06-05 NOTE — Patient Instructions (Addendum)
____________________________________________________________________________________________  Post-Procedure Discharge Instructions  Instructions:  Apply ice:   Purpose: This will minimize any swelling and discomfort after procedure.   When: Day of procedure, as soon as you get home.  How: Fill a plastic sandwich bag with crushed ice. Cover it with a small towel and apply to injection site.  How long: (15 min on, 15 min off) Apply for 15 minutes then remove x 15 minutes.  Repeat sequence on day of procedure, until you go to bed.  Apply heat:   Purpose: To treat any soreness and discomfort from the procedure.  When: Starting the next day after the procedure.  How: Apply heat to procedure site starting the day following the procedure.  How long: May continue to repeat daily, until discomfort goes away.  Food intake: Start with clear liquids (like water) and advance to regular food, as tolerated.   Physical activities: Keep activities to a minimum for the first 8 hours after the procedure. After that, then as tolerated.  Driving: If you have received any sedation, be responsible and do not drive. You are not allowed to drive for 24 hours after having sedation.  Blood thinner: (Applies only to those taking blood thinners) You may restart your blood thinner 6 hours after your procedure.  Insulin: (Applies only to Diabetic patients taking insulin) As soon as you can eat, you may resume your normal dosing schedule.  Infection prevention: Keep procedure site clean and dry. Shower daily and clean area with soap and water.  Post-procedure Pain Diary: Extremely important that this be done correctly and accurately. Recorded information will be used to determine the next step in treatment. For the purpose of accuracy, follow these rules:  Evaluate only the area treated. Do not report or include pain from an untreated area. For the purpose of this evaluation, ignore all other areas of pain,  except for the treated area.  After your procedure, avoid taking a long nap and attempting to complete the pain diary after you wake up. Instead, set your alarm clock to go off every hour, on the hour, for the initial 8 hours after the procedure. Document the duration of the numbing medicine, and the relief you are getting from it.  Do not go to sleep and attempt to complete it later. It will not be accurate. If you received sedation, it is likely that you were given a medication that may cause amnesia. Because of this, completing the diary at a later time may cause the information to be inaccurate. This information is needed to plan your care.  Follow-up appointment: Keep your post-procedure follow-up evaluation appointment after the procedure (usually 2 weeks for most procedures, 6 weeks for radiofrequencies). DO NOT FORGET to bring you pain diary with you.   Expect: (What should I expect to see with my procedure?)  From numbing medicine (AKA: Local Anesthetics): Numbness or decrease in pain. You may also experience some weakness, which if present, could last for the duration of the local anesthetic.  Onset: Full effect within 15 minutes of injected.  Duration: It will depend on the type of local anesthetic used. On the average, 1 to 8 hours.   From steroids (Applies only if steroids were used): Decrease in swelling or inflammation. Once inflammation is improved, relief of the pain will follow.  Onset of benefits: Depends on the amount of swelling present. The more swelling, the longer it will take for the benefits to be seen. In some cases, up to 10 days.    may typically last 2 weeks (the duration of the steroids). Frequent: Cramps (if they occur, drink Gatorade and take over-the-counter Magnesium 450-500 mg once to twice a day); water retention with temporary  weight gain; increases in blood sugar; decreased immune system response; increased appetite. Occasional: Facial flushing (red, warm cheeks); mood swings; menstrual changes. Uncommon: Long-term decrease or suppression of natural hormones; bone thinning. (These are more common with higher doses or more frequent use. This is why we prefer that our patients avoid having any injection therapies in other practices.)  Very Rare: Severe mood changes; psychosis; aseptic necrosis. From procedure: Some discomfort is to be expected once the numbing medicine wears off. This should be minimal if ice and heat are applied as instructed.  Call if: (When should I call?) You experience numbness and weakness that gets worse with time, as opposed to wearing off. New onset bowel or bladder incontinence. (Applies only to procedures done in the spine)  Emergency Numbers: Durning business hours (Monday - Thursday, 8:00 AM - 4:00 PM) (Friday, 9:00 AM - 12:00 Noon): [phone] After hours: [phone] NOTE: If you are having a problem and are unable connect with, or to talk to a provider, then go to your nearest urgent care or emergency department. If the problem is serious and urgent, please call [phone]. ____________________________________________________________________________________________  ____________________________________________________________________________________________  Patient Information update  To: All of our patients.  Re: Name change.  It has been made official that our current name, "[facility]"   will soon be changed to "[facility]".   The purpose of this change is to eliminate any confusion created by the concept of our practice being a "Medication Management Pain Clinic". In the past this has led to the misconception that we treat pain primarily by the use of prescription  medications.  Nothing can be farther from the truth.   Understanding PAIN MANAGEMENT: To further understand what our practice does, you first have to understand that "Pain Management" is a subspecialty that requires additional training once a physician has completed their specialty training, which can be in either Anesthesia, Neurology, Psychiatry, or Physical Medicine and Rehabilitation (PMR). Each one of these contributes to the final approach taken by each physician to the management of their patient's pain. To be a "Pain Management Specialist" you must have first completed one of the specialty trainings below.  Anesthesiologists - trained in clinical pharmacology and interventional techniques such as nerve blockade and regional as well as central neuroanatomy. They are trained to block pain before, during, and after surgical interventions.  Neurologists - trained in the diagnosis and pharmacological treatment of complex neurological conditions, such as Multiple Sclerosis, Parkinson's, spinal cord injuries, and other systemic conditions that may be associated with symptoms that may include but are not limited to pain. They tend to rely primarily on the treatment of chronic pain using prescription medications.  Psychiatrist - trained in conditions affecting the psychosocial wellbeing of patients including but not limited to depression, anxiety, schizophrenia, personality disorders, addiction, and other substance use disorders that may be associated with chronic pain. They tend to rely primarily on the treatment of chronic pain using prescription medications.   Physical Medicine and Rehabilitation (PMR) physicians, also known as physiatrists - trained to treat a wide variety of medical conditions affecting the brain, spinal cord, nerves, bones, joints, ligaments, muscles, and tendons. Their training is primarily aimed at treating patients that have suffered injuries   that have caused severe physical  impairment. Their training is primarily aimed at the physical therapy and rehabilitation of those patients. They may also work alongside orthopedic surgeons or neurosurgeons using their expertise in assisting surgical patients to recover after their surgeries.  INTERVENTIONAL PAIN MANAGEMENT is sub-subspecialty of Pain Management.  Our physicians are Board-certified in Anesthesia, Pain Management, and Interventional Pain Management.  This meaning that not only have they been trained and Board-certified in their specialty of Anesthesia, and subspecialty of Pain Management, but they have also received further training in the sub-subspecialty of Interventional Pain Management, in order to become Board-certified as INTERVENTIONAL PAIN MANAGEMENT SPECIALIST.    Mission: Our goal is to use our skills in  INTERVENTIONAL PAIN MANAGEMENT as alternatives to the chronic use of prescription opioid medications for the treatment of pain. To make this more clear, we have changed our name to reflect what we do and offer. We will continue to offer medication management assessment and recommendations, but we will not be taking over any patient's medication management.  ____________________________________________________________________________________________   

## 2022-06-08 ENCOUNTER — Ambulatory Visit
Admission: RE | Admit: 2022-06-08 | Discharge: 2022-06-08 | Disposition: A | Discharge status: Home / Self Care | Payer: Medicare PPO | Source: Ambulatory Visit | Attending: Pain Medicine | Admitting: Pain Medicine

## 2022-06-08 ENCOUNTER — Ambulatory Visit: Payer: Medicare PPO | Attending: Pain Medicine | Admitting: Pain Medicine

## 2022-06-08 ENCOUNTER — Encounter: Payer: Self-pay | Admitting: Pain Medicine

## 2022-06-08 VITALS — BP 115/67 | HR 67 | Temp 97.2°F | Resp 14 | Ht 68.0 in | Wt 175.0 lb

## 2022-06-08 DIAGNOSIS — M5459 Other low back pain: Secondary | ICD-10-CM

## 2022-06-08 DIAGNOSIS — M47816 Spondylosis without myelopathy or radiculopathy, lumbar region: Secondary | ICD-10-CM | POA: Diagnosis present

## 2022-06-08 DIAGNOSIS — M545 Low back pain, unspecified: Secondary | ICD-10-CM | POA: Diagnosis not present

## 2022-06-08 DIAGNOSIS — R937 Abnormal findings on diagnostic imaging of other parts of musculoskeletal system: Secondary | ICD-10-CM

## 2022-06-08 DIAGNOSIS — M4317 Spondylolisthesis, lumbosacral region: Secondary | ICD-10-CM | POA: Diagnosis not present

## 2022-06-08 DIAGNOSIS — G8929 Other chronic pain: Secondary | ICD-10-CM | POA: Insufficient documentation

## 2022-06-08 DIAGNOSIS — M4186 Other forms of scoliosis, lumbar region: Secondary | ICD-10-CM | POA: Diagnosis not present

## 2022-06-08 DIAGNOSIS — M5136 Other intervertebral disc degeneration, lumbar region: Secondary | ICD-10-CM | POA: Diagnosis not present

## 2022-06-08 DIAGNOSIS — M51369 Other intervertebral disc degeneration, lumbar region without mention of lumbar back pain or lower extremity pain: Secondary | ICD-10-CM

## 2022-06-08 DIAGNOSIS — M47817 Spondylosis without myelopathy or radiculopathy, lumbosacral region: Secondary | ICD-10-CM

## 2022-06-08 MED ORDER — TRIAMCINOLONE ACETONIDE 40 MG/ML IJ SUSP
40.0000 mg | Freq: Once | INTRAMUSCULAR | Status: AC
  Administered 2022-06-08: 40 mg
  Filled 2022-06-08: qty 1

## 2022-06-08 MED ORDER — LIDOCAINE HCL 2 % IJ SOLN
20.0000 mL | Freq: Once | INTRAMUSCULAR | Status: DC
  Filled 2022-06-08: qty 20

## 2022-06-08 MED ORDER — ROPIVACAINE HCL 2 MG/ML IJ SOLN
9.0000 mL | Freq: Once | INTRAMUSCULAR | Status: AC
  Administered 2022-06-08: 9 mL via PERINEURAL
  Filled 2022-06-08: qty 20

## 2022-06-08 MED ORDER — PENTAFLUOROPROP-TETRAFLUOROETH EX AERO
INHALATION_SPRAY | Freq: Once | CUTANEOUS | Status: AC
  Administered 2022-06-08: 1 via TOPICAL

## 2022-06-08 NOTE — Progress Notes (Signed)
Safety precautions to be maintained throughout the outpatient stay will include: orient to surroundings, keep bed in low position, maintain call bell within reach at all times, provide assistance with transfer out of bed and ambulation.  

## 2022-06-08 NOTE — Progress Notes (Signed)
PROVIDER NOTE: Interpretation of information contained herein should be left to medically-trained personnel. Specific patient instructions are provided elsewhere under "Patient Instructions" section of medical record. This document was created in part using STT-dictation technology, any transcriptional errors that may result from this process are unintentional.  Patient: Shelley Mahoney Type: Established DOB: 07-23-1931 MRN: [id number] PCP: Lauro Regulus, MD  Service: Procedure DOS: 06/08/2022 Setting: Ambulatory Location: Ambulatory outpatient facility Delivery: Face-to-face Provider: Oswaldo Done, MD Specialty: Interventional Pain Management Specialty designation: 09 Location: Outpatient facility Ref. Prov.: Delano Metz, MD    Procedure:           Type: Lumbar Facet, Medial Branch Block(s) #2  Laterality: Right  Level: L2, L3, L4, L5, and S1 Medial Branch Level(s). Injecting these levels blocks the L3-4, L4-5, and L5-S1 lumbar facet joints. Imaging: Fluoroscopic guidance         Anesthesia: Local anesthesia (1-2% Lidocaine) Anxiolysis: None Versed         Sedation: No Sedation                       DOS: 06/08/2022 Performed by: Oswaldo Done, MD  Primary Purpose: Diagnostic/Therapeutic Indications: Low back pain severe enough to impact quality of life or function. 1. Lumbar facet joint pain   2. Lumbosacral facet syndrome   3. Grade 1 Anterolisthesis of lumbosacral spine (L5/S1)   4. Spondylosis without myelopathy or radiculopathy, lumbosacral region   5. Lumbar facet joint osteoarthritis   6. Lumbar facet hypertrophy (Multilevel) (Bilateral)   7. DJD (degenerative joint disease), lumbosacral   8. DDD (degenerative disc disease), lumbar   9. Chronic low back pain (1ry area of Pain) (Bilateral) (R=L) w/o sciatica   10. Dextroscoliosis of lumbar spine    NAS-11 Pain score:   Pre-procedure: 1 /10   Post-procedure: 0-No pain/10     Position / Prep /  Materials:  Position: Prone  Prep solution: DuraPrep (Iodine Povacrylex [0.7% available iodine] and Isopropyl Alcohol, 74% w/w) Area Prepped: Posterolateral Lumbosacral Spine (Wide prep: From the lower border of the scapula down to the end of the tailbone and from flank to flank.)  Materials:  Tray: Block Needle(s):  Type: Spinal  Gauge (G): 22  Length: 5-in Qty: 4     Pre-op H&P Assessment:  Ms. Formanek is a 89 y.o. (year old), female patient, seen today for interventional treatment. She  has a past surgical history that includes Thyroid surgery; Colonoscopy with propofol (N/A, 12/08/2014); Flexible bronchoscopy (N/A, 04/21/2016); Eye surgery; and Abdominal hysterectomy. Ms. Warman has a current medication list which includes the following prescription(s): aspirin, cholecalciferol, gabapentin, gabapentin, iron, levothyroxine, magnesium, multivitamin, preservision areds, pramipexole, primidone, probiotic product, and simvastatin, and the following Facility-Administered Medications: lidocaine. Her primarily concern today is the Back Pain (lower)  Initial Vital Signs:  Pulse/HCG Rate: 67ECG Heart Rate: 71 Temp: (!) 97.2 F (36.2 C) Resp: 12 BP: 114/64 SpO2: 100 %  BMI: Estimated body mass index is 26.61 kg/m as calculated from the following:   Height as of this encounter: 5\' 8"  (1.727 m).   Weight as of this encounter: 175 lb (79.4 kg).  Risk Assessment: Allergies: Reviewed. She has No Known Allergies.  Allergy Precautions: None required Coagulopathies: Reviewed. None identified.  Blood-thinner therapy: None at this time Active Infection(s): Reviewed. None identified. Ms. Norby is afebrile  Site Confirmation: Ms. Finstad was asked to confirm the procedure and laterality before marking the site Procedure checklist: Completed Consent: Before the procedure  and under the influence of no sedative(s), amnesic(s), or anxiolytics, the patient was informed of the treatment options, risks  and possible complications. To fulfill our ethical and legal obligations, as recommended by the American Medical Association's Code of Ethics, I have informed the patient of my clinical impression; the nature and purpose of the treatment or procedure; the risks, benefits, and possible complications of the intervention; the alternatives, including doing nothing; the risk(s) and benefit(s) of the alternative treatment(s) or procedure(s); and the risk(s) and benefit(s) of doing nothing. The patient was provided information about the general risks and possible complications associated with the procedure. These may include, but are not limited to: failure to achieve desired goals, infection, bleeding, organ or nerve damage, allergic reactions, paralysis, and death. In addition, the patient was informed of those risks and complications associated to Spine-related procedures, such as failure to decrease pain; infection (i.e.: Meningitis, epidural or intraspinal abscess); bleeding (i.e.: epidural hematoma, subarachnoid hemorrhage, or any other type of intraspinal or peri-dural bleeding); organ or nerve damage (i.e.: Any type of peripheral nerve, nerve root, or spinal cord injury) with subsequent damage to sensory, motor, and/or autonomic systems, resulting in permanent pain, numbness, and/or weakness of one or several areas of the body; allergic reactions; (i.e.: anaphylactic reaction); and/or death. Furthermore, the patient was informed of those risks and complications associated with the medications. These include, but are not limited to: allergic reactions (i.e.: anaphylactic or anaphylactoid reaction(s)); adrenal axis suppression; blood sugar elevation that in diabetics may result in ketoacidosis or comma; water retention that in patients with history of congestive heart failure may result in shortness of breath, pulmonary edema, and decompensation with resultant heart failure; weight gain; swelling or edema;  medication-induced neural toxicity; particulate matter embolism and blood vessel occlusion with resultant organ, and/or nervous system infarction; and/or aseptic necrosis of one or more joints. Finally, the patient was informed that Medicine is not an exact science; therefore, there is also the possibility of unforeseen or unpredictable risks and/or possible complications that may result in a catastrophic outcome. The patient indicated having understood very clearly. We have given the patient no guarantees and we have made no promises. Enough time was given to the patient to ask questions, all of which were answered to the patient's satisfaction. Ms. Matson has indicated that she wanted to continue with the procedure. Attestation: I, the ordering provider, attest that I have discussed with the patient the benefits, risks, side-effects, alternatives, likelihood of achieving goals, and potential problems during recovery for the procedure that I have provided informed consent. Date  Time: 06/08/2022 10:33 AM  Pre-Procedure Preparation:  Monitoring: As per clinic protocol. Respiration, ETCO2, SpO2, BP, heart rate and rhythm monitor placed and checked for adequate function Safety Precautions: Patient was assessed for positional comfort and pressure points before starting the procedure. Time-out: I initiated and conducted the "Time-out" before starting the procedure, as per protocol. The patient was asked to participate by confirming the accuracy of the "Time Out" information. Verification of the correct person, site, and procedure were performed and confirmed by me, the nursing staff, and the patient. "Time-out" conducted as per Joint Commission's Universal Protocol (UP.01.01.01). Time: 1134  Description of Procedure:          Laterality: Right Targeted Levels: L2, L3, L4, L5, and S1  Medial Branch Level(s)  Safety Precautions: Aspiration looking for blood return was conducted prior to all injections. At  no point did we inject any substances, as a needle was being advanced.  Before injecting, the patient was told to immediately notify me if she was experiencing any new onset of "ringing in the ears, or metallic taste in the mouth". No attempts were made at seeking any paresthesias. Safe injection practices and needle disposal techniques used. Medications properly checked for expiration dates. SDV (single dose vial) medications used. After the completion of the procedure, all disposable equipment used was discarded in the proper designated medical waste containers. Local Anesthesia: Protocol guidelines were followed. The patient was positioned over the fluoroscopy table. The area was prepped in the usual manner. The time-out was completed. The target area was identified using fluoroscopy. A 12-in long, straight, sterile hemostat was used with fluoroscopic guidance to locate the targets for each level blocked. Once located, the skin was marked with an approved surgical skin marker. Once all sites were marked, the skin (epidermis, dermis, and hypodermis), as well as deeper tissues (fat, connective tissue and muscle) were infiltrated with a small amount of a short-acting local anesthetic, loaded on a 10cc syringe with a 25G, 1.5-in  Needle. An appropriate amount of time was allowed for local anesthetics to take effect before proceeding to the next step. Local Anesthetic: Lidocaine 2.0% The unused portion of the local anesthetic was discarded in the proper designated containers. Technical description of process:  L2 Medial Branch Nerve Block (MBB): The target area for the L2 medial branch is at the junction of the postero-lateral aspect of the superior articular process and the superior, posterior, and medial edge of the transverse process of L3. Under fluoroscopic guidance, a Quincke needle was inserted until contact was made with os over the superior postero-lateral aspect of the pedicular shadow (target area).  After negative aspiration for blood, 0.5 mL of the nerve block solution was injected without difficulty or complication. The needle was removed intact. L3 Medial Branch Nerve Block (MBB): The target area for the L3 medial branch is at the junction of the postero-lateral aspect of the superior articular process and the superior, posterior, and medial edge of the transverse process of L4. Under fluoroscopic guidance, a Quincke needle was inserted until contact was made with os over the superior postero-lateral aspect of the pedicular shadow (target area). After negative aspiration for blood, 0.5 mL of the nerve block solution was injected without difficulty or complication. The needle was removed intact. L4 Medial Branch Nerve Block (MBB): The target area for the L4 medial branch is at the junction of the postero-lateral aspect of the superior articular process and the superior, posterior, and medial edge of the transverse process of L5. Under fluoroscopic guidance, a Quincke needle was inserted until contact was made with os over the superior postero-lateral aspect of the pedicular shadow (target area). After negative aspiration for blood, 0.5 mL of the nerve block solution was injected without difficulty or complication. The needle was removed intact. L5 Medial Branch Nerve Block (MBB): The target area for the L5 medial branch is at the junction of the postero-lateral aspect of the superior articular process and the superior, posterior, and medial edge of the sacral ala. Under fluoroscopic guidance, a Quincke needle was inserted until contact was made with os over the superior postero-lateral aspect of the pedicular shadow (target area). After negative aspiration for blood, 0.5 mL of the nerve block solution was injected without difficulty or complication. The needle was removed intact. S1 Medial Branch Nerve Block (MBB): The target area for the S1 medial branch is at the posterior and inferior 6 o'clock  position of  the L5-S1 facet joint. Under fluoroscopic guidance, the Quincke needle inserted for the L5 MBB was redirected until contact was made with os over the inferior and postero aspect of the sacrum, at the 6 o' clock position under the L5-S1 facet joint (Target area). After negative aspiration for blood, 0.5 mL of the nerve block solution was injected without difficulty or complication. The needle was removed intact.  Once the entire procedure was completed, the treated area was cleaned, making sure to leave some of the prepping solution back to take advantage of its long term bactericidal properties.         Illustration of the posterior view of the lumbar spine and the posterior neural structures. Laminae of L2 through S1 are labeled. DPRL5, dorsal primary ramus of L5; DPRS1, dorsal primary ramus of S1; DPR3, dorsal primary ramus of L3; FJ, facet (zygapophyseal) joint L3-L4; I, inferior articular process of L4; LB1, lateral branch of dorsal primary ramus of L1; IAB, inferior articular branches from L3 medial branch (supplies L4-L5 facet joint); IBP, intermediate branch plexus; MB3, medial branch of dorsal primary ramus of L3; NR3, third lumbar nerve root; S, superior articular process of L5; SAB, superior articular branches from L4 (supplies L4-5 facet joint also); TP3, transverse process of L3.  Vitals:   06/08/22 1030 06/08/22 1134 06/08/22 1138 06/08/22 1140  BP: 114/64 110/64 114/63 115/67  Pulse: 67     Resp:  12 15 14   Temp: (!) 97.2 F (36.2 C)     SpO2: 100% 99% 100% 99%  Weight: 175 lb (79.4 kg)     Height: 5\' 8"  (1.727 m)        Start Time: 1134 hrs. End Time: 1139 hrs.  Imaging Guidance (Spinal):          Type of Imaging Technique: Fluoroscopy Guidance (Spinal) Indication(s): Assistance in needle guidance and placement for procedures requiring needle placement in or near specific anatomical locations not easily accessible without such assistance. Exposure Time: Please  see nurses notes. Contrast: None used. Fluoroscopic Guidance: I was personally present during the use of fluoroscopy. "Tunnel Vision Technique" used to obtain the best possible view of the target area. Parallax error corrected before commencing the procedure. "Direction-depth-direction" technique used to introduce the needle under continuous pulsed fluoroscopy. Once target was reached, antero-posterior, oblique, and lateral fluoroscopic projection used confirm needle placement in all planes. Images permanently stored in EMR. Interpretation: No contrast injected. I personally interpreted the imaging intraoperatively. Adequate needle placement confirmed in multiple planes. Permanent images saved into the patient's record.  Antibiotic Prophylaxis:   Anti-infectives (From admission, onward)    None      Indication(s): None identified  Post-operative Assessment:  Post-procedure Vital Signs:  Pulse/HCG Rate: 6768 Temp: (!) 97.2 F (36.2 C) Resp: 14 BP: 115/67 SpO2: 99 %  EBL: None  Complications: No immediate post-treatment complications observed by team, or reported by patient.  Note: The patient tolerated the entire procedure well. A repeat set of vitals were taken after the procedure and the patient was kept under observation following institutional policy, for this type of procedure. Post-procedural neurological assessment was performed, showing return to baseline, prior to discharge. The patient was provided with post-procedure discharge instructions, including a section on how to identify potential problems. Should any problems arise concerning this procedure, the patient was given instructions to immediately contact us, at any time, without hesitation. In any case, we plan to contact the patient by telephone for a follow-up status report regarding this  interventional procedure.  Comments:  No additional relevant information.  Plan of Care  Orders:  Orders Placed This Encounter   Procedures   LUMBAR FACET(MEDIAL BRANCH NERVE BLOCK) MBNB    Scheduling Instructions:     Procedure: Lumbar facet block (AKA.: Lumbosacral medial branch nerve block)     Side: Right-sided     Level: L3-4, L4-5, and L5-S1 Facets (L2, L3, L4, L5, and S1 Medial Branch Nerves)     Sedation: Patient's choice.     Timeframe: Today    Order Specific Question:   Where will this procedure be performed?    Answer:   [facility]   DG [facility] C-ARM 1-60 MIN NO REPORT    Intraoperative interpretation by procedural physician at [facility].    Standing Status:   Standing    Number of Occurrences:   1    Order Specific Question:   Reason for exam:    Answer:   Assistance in needle guidance and placement for procedures requiring needle placement in or near specific anatomical locations not easily accessible without such assistance.   Informed Consent Details: Physician/Practitioner Attestation; Transcribe to consent form and obtain patient signature    Nursing Order: Transcribe to consent form and obtain patient signature. Note: Always confirm laterality of pain with Ms. Rodda, before procedure.    Order Specific Question:   Physician/Practitioner attestation of informed consent for procedure/surgical case    Answer:   I, the physician/practitioner, attest that I have discussed with the patient the benefits, risks, side effects, alternatives, likelihood of achieving goals and potential problems during recovery for the procedure that I have provided informed consent.    Order Specific Question:   Procedure    Answer:   Lumbar Facet Block  under fluoroscopic guidance    Order Specific Question:   Physician/Practitioner performing the procedure    Answer:   Selinda Korzeniewski A. Laban Emperor MD    Order Specific Question:   Indication/Reason    Answer:   Low Back Pain, with our without leg pain, due to Facet Joint Arthralgia (Joint Pain) Spondylosis (Arthritis of the Spine), without myelopathy  or radiculopathy (Nerve Damage).   Provide equipment / supplies at bedside    Procedure tray: "Block Tray" (Disposable  single use) Skin infiltration needle: Regular 1.5-in, 25-G, (x1) Block Needle type: Spinal Amount/quantity: 4 Size: Regular (3.5-inch) Gauge: 22G    Standing Status:   Standing    Number of Occurrences:   1    Order Specific Question:   Specify    Answer:   Block Tray   Chronic Opioid Analgesic:  None MME/day: 0 mg/day   Medications ordered for procedure: Meds ordered this encounter  Medications   lidocaine (XYLOCAINE) 2 % (with pres) injection 400 mg   pentafluoroprop-tetrafluoroeth (GEBAUERS) aerosol   ropivacaine (PF) 2 mg/mL (0.2%) (NAROPIN) injection 9 mL   triamcinolone acetonide (KENALOG-40) injection 40 mg   Medications administered: We administered pentafluoroprop-tetrafluoroeth, ropivacaine (PF) 2 mg/mL (0.2%), and triamcinolone acetonide.  See the medical record for exact dosing, route, and time of administration.  Follow-up plan:   Return in about 2 weeks (around 06/22/2022) for Proc-day (T,Th), (F2F), (PPE).       Interventional Therapies  Risk  Complexity Considerations:   Advanced age   Planned  Pending:   Diagnostic right lumbar facet (L2-S1) MBB #2    Under consideration:   Diagnostic bilateral lumbar facet MBB (L2-S1) #2  Possible bilateral lumbar facet RFA (L2-S1) #1  Possible  bilateral spinal cord stimulator trial/implant    Completed:   Diagnostic bilateral lumbar facet MBB (L2-S1) x1 (05/04/2022) (100/100/100/100)    Completed by other providers:   Diagnostic/therapeutic bilateral S1 TFESI x5 (11/20/2019, 12/24/2019, 04/30/2020, 06/30/2020, 04/08/2021) by Filomena Jungling, MD [facility])  Diagnostic bilateral L3, L4, and L5 MBB x2 (05/26/2021, 06/10/2021) by Filomena Jungling, MD [facility])  Therapeutic bilateral L3, L4, and L5 MB RFA x1 (10/12/2021) by Merri Ray, DO [facility])    Therapeutic  Palliative (PRN) options:   None  established     Recent Visits Date Type Provider Dept  05/25/22 Office Visit Delano Metz, MD [facility]  05/04/22 Procedure visit Delano Metz, MD [facility]  04/24/22 Office Visit Delano Metz, MD [facility]  Showing recent visits within past 90 days and meeting all other requirements Today's Visits Date Type Provider Dept  06/08/22 Procedure visit Delano Metz, MD [facility]  Showing today's visits and meeting all other requirements Future Appointments Date Type Provider Dept  06/27/22 Appointment Delano Metz, MD [facility]  Showing future appointments within next 90 days and meeting all other requirements  Disposition: Discharge home  Discharge (Date  Time): 06/08/2022; 1146 hrs.   Primary Care Physician: Lauro Regulus, MD Location: [facility] Note by: Oswaldo Done, MD Date: 06/08/2022; Time: 12:14 PM  Disclaimer:  Medicine is not an Visual merchandiser. The only guarantee in medicine is that nothing is guaranteed. It is important to note that the decision to proceed with this intervention was based on the information collected from the patient. The Data and conclusions were drawn from the patient's questionnaire, the interview, and the physical examination. Because the information was provided in large part by the patient, it cannot be guaranteed that it has not been purposely or unconsciously manipulated. Every effort has been made to obtain as much relevant data as possible for this evaluation. It is important to note that the conclusions that lead to this procedure are derived in large part from the available data. Always take into account that the treatment will also be dependent on availability of resources and existing treatment guidelines, considered by other Pain Management Practitioners as being common knowledge and practice, at the time of the  intervention. For Medico-Legal purposes, it is also important to point out that variation in procedural techniques and pharmacological choices are the acceptable norm. The indications, contraindications, technique, and results of the above procedure should only be interpreted and judged by a Board-Certified Interventional Pain Specialist with extensive familiarity and expertise in the same exact procedure and technique.

## 2022-06-09 ENCOUNTER — Telehealth: Payer: Self-pay | Admitting: *Deleted

## 2022-06-09 NOTE — Telephone Encounter (Signed)
Attempted to call for post procedure follow-up. Message left. 

## 2022-06-25 NOTE — Progress Notes (Unsigned)
PROVIDER NOTE: Information contained herein reflects review and annotations entered in association with encounter. Interpretation of such information and data should be left to medically-trained personnel. Information provided to patient can be located elsewhere in the medical record under "Patient Instructions". Document created using STT-dictation technology, any transcriptional errors that may result from process are unintentional.    Patient: Shelley Mahoney  Service Category: E/M  Provider: Gaspar Cola, MD  DOB: [birth year]  DOS: 06/27/2022  Referring Provider: Kirk Ruths, MD  MRN: [id number]  Specialty: Interventional Pain Management  PCP: Kirk Ruths, MD  Type: Established Patient  Setting: Ambulatory outpatient    Location: Office  Delivery: Face-to-face     HPI  Ms. Shelley Mahoney, a 89 y.o. year old female, is here today because of her No primary diagnosis found.. Ms. Shelley Mahoney's primary complain today is No chief complaint on file. Last encounter: My last encounter with her was on 06/08/2022. Pertinent problems: Ms. Shelley Mahoney has Mass of upper lobe of left lung; Primary cancer of left upper lobe of lung ([place]); Malignant neoplasm of upper lobe of left lung ([place]); Neuropathy; Lung cancer ([place]); Chronic low back pain (1ry area of Pain) (Bilateral) (R=L) w/o sciatica; Lumbar stenosis without neurogenic claudication; Status post deep brain stimulator placement; Strain of right knee; Chronic pain syndrome; Abnormal CT scan, lumbar spine (10/28/2019); Grade 1 Anterolisthesis of lumbosacral spine (L5/S1); Chronic mid back pain; Lumbar scoliosis due to degenerative disease of spine in adult; Lumbar spondylolisthesis (L5-S1 3 mm Anterolisthesis) (Right: L3-4 9 mm translation); Lumbar facet hypertrophy (Multilevel) (Bilateral); Lumbar facet joint osteoarthritis; DDD (degenerative disc disease), lumbar; DJD (degenerative joint disease), lumbosacral; Ligamentum flavum hypertrophy  (L2-3, L3-4, L4-5); Lumbosacral foraminal stenosis (Left: L2-3) (Bilateral: L3-4) (L>R: L4-5, L5-S1); Lumbosacral lateral recess stenosis (Left: L2-3) (Bilateral: L3-4, L4-5, L5-S1); Spondylosis without myelopathy or radiculopathy, lumbosacral region; Dextroscoliosis of lumbar spine; Lumbar facet joint pain; Lumbosacral facet syndrome; Chronic feet pain (2ry area of Pain) (Bilateral); Abnormal NCS (nerve conduction studies) (LE) (04/04/2022); and Sensory polyneuropathy (lower extremites) on their pertinent problem list. Pain Assessment: Severity of   is reported as a  /10. Location:    / . Onset:  . Quality:  . Timing:  . Modifying factor(s):  Marland Kitchen Vitals:  vitals were not taken for this visit.  BMI: Estimated body mass index is 26.61 kg/m as calculated from the following:   Height as of 06/08/22: 5\' 8"  (1.727 m).   Weight as of 06/08/22: 175 lb (79.4 kg).  Reason for encounter: post-procedure evaluation and assessment. ***  Post-procedure evaluation   Type: Lumbar Facet, Medial Branch Block(s) #2  Laterality: Right  Level: L2, L3, L4, L5, and S1 Medial Branch Level(s). Injecting these levels blocks the L3-4, L4-5, and L5-S1 lumbar facet joints. Imaging: Fluoroscopic guidance         Anesthesia: Local anesthesia (1-2% Lidocaine) Anxiolysis: None Versed         Sedation: No Sedation                       DOS: 06/08/2022 Performed by: Gaspar Cola, MD  Primary Purpose: Diagnostic/Therapeutic Indications: Low back pain severe enough to impact quality of life or function. 1. Lumbar facet joint pain   2. Lumbosacral facet syndrome   3. Grade 1 Anterolisthesis of lumbosacral spine (L5/S1)   4. Spondylosis without myelopathy or radiculopathy, lumbosacral region   5. Lumbar facet joint osteoarthritis   6. Lumbar facet hypertrophy (Multilevel) (Bilateral)  7. DJD (degenerative joint disease), lumbosacral   8. DDD (degenerative disc disease), lumbar   9. Chronic low back pain (1ry area of  Pain) (Bilateral) (R=L) w/o sciatica   10. Dextroscoliosis of lumbar spine    NAS-11 Pain score:   Pre-procedure: 1 /10   Post-procedure: 0-No pain/10      Effectiveness:  Initial hour after procedure:   ***. Subsequent 4-6 hours post-procedure:   ***. Analgesia past initial 6 hours:   ***. Ongoing improvement:  Analgesic:  *** Function:    ***    ROM:    ***     Pharmacotherapy Assessment  Analgesic: None MME/day: 0 mg/day   Monitoring: [place] PMP: PDMP reviewed during this encounter.       Pharmacotherapy: No side-effects or adverse reactions reported. Compliance: No problems identified. Effectiveness: Clinically acceptable.  No notes on file  No results found for: "CBDTHCR" No results found for: "D8THCCBX" No results found for: "D9THCCBX"  UDS:  Summary  Date Value Ref Range Status  02/01/2022 Note  Final    Comment:    ==================================================================== Compliance Drug Analysis, Ur ==================================================================== Test                             Result       Flag       Units  Drug Present and Declared for Prescription Verification   Primidone                      PRESENT      EXPECTED   Phenobarbital                  PRESENT      EXPECTED    Phenobarbital is an expected metabolite of primidone; Phenobarbital    may also be administered as a prescription drug.    Gabapentin                     PRESENT      EXPECTED  Drug Present not Declared for Prescription Verification   Acetaminophen                  PRESENT      UNEXPECTED  Drug Absent but Declared for Prescription Verification   Salicylate                     Not Detected UNEXPECTED    Aspirin, as indicated in the declared medication list, is not always    detected even when used as directed.  ==================================================================== Test                      Result    Flag   Units      Ref Range    Creatinine              28               mg/dL      >=20 ==================================================================== Declared Medications:  The flagging and interpretation on this report are based on the  following declared medications.  Unexpected results may arise from  inaccuracies in the declared medications.   **Note: The testing scope of this panel includes these medications:   Gabapentin (Neurontin)  Primidone (Mysoline)   **Note: The testing scope of this panel does not include small to  moderate amounts of these reported medications:   Aspirin   **  Note: The testing scope of this panel does not include the  following reported medications:   Iron  Levothyroxine (Synthroid)  Magnesium  Multivitamin  Pramipexole (Mirapex)  Probiotic  Simvastatin (Zocor)  Supplement (PreserVision)  Vitamin D ==================================================================== For clinical consultation, please call [phone]. ====================================================================       ROS  Constitutional: Denies any fever or chills Gastrointestinal: No reported hemesis, hematochezia, vomiting, or acute GI distress Musculoskeletal: Denies any acute onset joint swelling, redness, loss of ROM, or weakness Neurological: No reported episodes of acute onset apraxia, aphasia, dysarthria, agnosia, amnesia, paralysis, loss of coordination, or loss of consciousness  Medication Review  Aspirin, Iron, Magnesium, PreserVision AREDS, Probiotic Product, cholecalciferol, gabapentin, levothyroxine, multivitamin, pramipexole, primidone, and simvastatin  History Review  Allergy: Ms. Shelley Mahoney has No Known Allergies. Drug: Ms. Shelley Mahoney  reports no history of drug use. Alcohol:  reports no history of alcohol use. Tobacco:  reports that she has never smoked. She has never used smokeless tobacco. Social: Ms. Shelley Mahoney  reports that she has never smoked. She has never used  smokeless tobacco. She reports that she does not drink alcohol and does not use drugs. Medical:  has a past medical history of Carotid stenosis, Carotid stenosis, Chronic kidney disease, CKD (chronic kidney disease), Dysplastic nevus (09/22/2019), Hypothyroidism, Lung cancer ([place]) (2018), Neuro-degenerative disorders ([place]), Personal history of radiation therapy, and Uterine cancer ([place]). Surgical: Ms. Shelley Mahoney  has a past surgical history that includes Thyroid surgery; Colonoscopy with propofol (N/A, 12/08/2014); Flexible bronchoscopy (N/A, 04/21/2016); Eye surgery; and Abdominal hysterectomy. Family: family history includes Breast cancer in her mother; Colon cancer in her maternal grandmother; Other in her father.  Laboratory Chemistry Profile   Renal Lab Results  Component Value Date   BUN 23 02/01/2022   CREATININE 1.00 02/01/2022   BCR 23 02/01/2022   GFRAA >60 04/28/2016   GFRNONAA 47 (L) 04/09/2021    Hepatic Lab Results  Component Value Date   AST 21 02/01/2022   ALT 23 04/09/2021   ALBUMIN 4.4 02/01/2022   ALKPHOS 74 02/01/2022   LIPASE 41 04/09/2021    Electrolytes Lab Results  Component Value Date   NA 138 02/01/2022   K 4.7 02/01/2022   CL 102 02/01/2022   CALCIUM 8.9 02/01/2022   MG 2.6 (H) 02/01/2022    Bone Lab Results  Component Value Date   25OHVITD1 47 02/01/2022   25OHVITD2 <1.0 02/01/2022   25OHVITD3 47 02/01/2022    Inflammation (CRP: Acute Phase) (ESR: Chronic Phase) Lab Results  Component Value Date   CRP 1 02/01/2022   ESRSEDRATE 20 02/01/2022         Note: Above Lab results reviewed.  Recent Imaging Review  DG PAIN CLINIC C-ARM 1-60 MIN NO REPORT Fluoro was used, but no Radiologist interpretation will be provided.  Please refer to "NOTES" tab for provider progress note. Note: Reviewed        Physical Exam  General appearance: Well nourished, well developed, and well hydrated. In no apparent acute distress Mental status: Alert, oriented x 3  (person, place, & time)       Respiratory: No evidence of acute respiratory distress Eyes: PERLA Vitals: There were no vitals taken for this visit. BMI: Estimated body mass index is 26.61 kg/m as calculated from the following:   Height as of 06/08/22: 5\' 8"  (1.727 m).   Weight as of 06/08/22: 175 lb (79.4 kg). Ideal: Patient weight not recorded  Assessment   Diagnosis Status  No diagnosis found. Controlled Controlled  Controlled   Updated Problems: No problems updated.  Plan of Care  Problem-specific:  No problem-specific Assessment & Plan notes found for this encounter.  Ms. Shelley Mahoney has a current medication list which includes the following long-term medication(s): levothyroxine, pramipexole, primidone, and simvastatin.  Pharmacotherapy (Medications Ordered): No orders of the defined types were placed in this encounter.  Orders:  No orders of the defined types were placed in this encounter.  Follow-up plan:   No follow-ups on file.     Interventional Therapies  Risk  Complexity Considerations:   Advanced age   Planned  Pending:   Diagnostic right lumbar facet (L2-S1) MBB #2    Under consideration:   Diagnostic bilateral lumbar facet MBB (L2-S1) #2  Possible bilateral lumbar facet RFA (L2-S1) #1  Possible bilateral spinal cord stimulator trial/implant    Completed:   Diagnostic bilateral lumbar facet MBB (L2-S1) x1 (05/04/2022) (100/100/100/100)    Completed by other providers:   Diagnostic/therapeutic bilateral S1 TFESI x5 (11/20/2019, 12/24/2019, 04/30/2020, 06/30/2020, 04/08/2021) by Girtha Hake, MD [facility])  Diagnostic bilateral L3, L4, and L5 MBB x2 (05/26/2021, 06/10/2021) by Girtha Hake, MD [facility])  Therapeutic bilateral L3, L4, and L5 MB RFA x1 (10/12/2021) by Sharlet Salina, DO [facility])    Therapeutic  Palliative (PRN) options:   None established      Recent Visits Date Type Provider Dept  06/08/22 Procedure visit Milinda Pointer, MD [facility]  05/25/22 Office Visit Milinda Pointer, MD [facility]  05/04/22 Procedure visit Milinda Pointer, MD [facility]  04/24/22 Office Visit Milinda Pointer, MD [facility]  Showing recent visits within past 90 days and meeting all other requirements Future Appointments Date Type Provider Dept  06/27/22 Appointment Milinda Pointer, MD [facility]  Showing future appointments within next 90 days and meeting all other requirements  I discussed the assessment and treatment plan with the patient. The patient was provided an opportunity to ask questions and all were answered. The patient agreed with the plan and demonstrated an understanding of the instructions.  Patient advised to call back or seek an in-person evaluation if the symptoms or condition worsens.  Duration of encounter: *** minutes.  Total time on encounter, as per AMA guidelines included both the face-to-face and non-face-to-face time personally spent by the physician and/or other qualified health care professional(s) on the day of the encounter (includes time in activities that require the physician or other qualified health care professional and does not include time in activities normally performed by clinical staff). Physician's time may include the following activities when performed: Preparing to see the patient (e.g., pre-charting review of records, searching for previously ordered imaging, lab work, and nerve conduction tests) Review of prior analgesic pharmacotherapies. Reviewing PMP Interpreting ordered tests (e.g., lab work, imaging, nerve conduction tests) Performing post-procedure evaluations, including interpretation of diagnostic procedures Obtaining and/or reviewing separately obtained history Performing a medically appropriate examination and/or evaluation Counseling and educating the patient/family/caregiver Ordering medications,  tests, or procedures Referring and communicating with other health care professionals (when not separately reported) Documenting clinical information in the electronic or other health record Independently interpreting results (not separately reported) and communicating results to the patient/ family/caregiver Care coordination (not separately reported)  Note by: Gaspar Cola, MD Date: 06/27/2022; Time: 1:17 PM

## 2022-06-27 ENCOUNTER — Encounter: Payer: Self-pay | Admitting: Pain Medicine

## 2022-06-27 ENCOUNTER — Ambulatory Visit: Payer: Medicare PPO | Attending: Pain Medicine | Admitting: Pain Medicine

## 2022-06-27 VITALS — BP 122/70 | HR 78 | Temp 97.7°F | Resp 18 | Ht 67.5 in | Wt 175.0 lb

## 2022-06-27 DIAGNOSIS — M5459 Other low back pain: Secondary | ICD-10-CM | POA: Diagnosis present

## 2022-06-27 DIAGNOSIS — M4317 Spondylolisthesis, lumbosacral region: Secondary | ICD-10-CM | POA: Diagnosis present

## 2022-06-27 DIAGNOSIS — M4316 Spondylolisthesis, lumbar region: Secondary | ICD-10-CM

## 2022-06-27 DIAGNOSIS — M47817 Spondylosis without myelopathy or radiculopathy, lumbosacral region: Secondary | ICD-10-CM

## 2022-06-27 DIAGNOSIS — M545 Low back pain, unspecified: Secondary | ICD-10-CM

## 2022-06-27 DIAGNOSIS — M47816 Spondylosis without myelopathy or radiculopathy, lumbar region: Secondary | ICD-10-CM

## 2022-06-27 DIAGNOSIS — G8929 Other chronic pain: Secondary | ICD-10-CM | POA: Diagnosis present

## 2022-06-27 NOTE — Patient Instructions (Signed)

## 2022-06-27 NOTE — Progress Notes (Signed)
Safety precautions to be maintained throughout the outpatient stay will include: orient to surroundings, keep bed in low position, maintain call bell within reach at all times, provide assistance with transfer out of bed and ambulation.  

## 2022-07-18 ENCOUNTER — Encounter: Payer: Self-pay | Admitting: Pain Medicine

## 2022-07-18 ENCOUNTER — Ambulatory Visit: Payer: Medicare PPO | Attending: Pain Medicine | Admitting: Pain Medicine

## 2022-07-18 ENCOUNTER — Ambulatory Visit
Admission: RE | Admit: 2022-07-18 | Discharge: 2022-07-18 | Disposition: A | Discharge status: Home / Self Care | Payer: Medicare PPO | Source: Ambulatory Visit | Attending: Pain Medicine | Admitting: Pain Medicine

## 2022-07-18 VITALS — BP 116/67 | HR 76 | Temp 97.4°F | Resp 18 | Ht 67.5 in | Wt 175.0 lb

## 2022-07-18 DIAGNOSIS — G8918 Other acute postprocedural pain: Secondary | ICD-10-CM

## 2022-07-18 DIAGNOSIS — M5136 Other intervertebral disc degeneration, lumbar region: Secondary | ICD-10-CM | POA: Diagnosis present

## 2022-07-18 DIAGNOSIS — M47816 Spondylosis without myelopathy or radiculopathy, lumbar region: Secondary | ICD-10-CM | POA: Diagnosis not present

## 2022-07-18 DIAGNOSIS — M545 Low back pain, unspecified: Secondary | ICD-10-CM | POA: Diagnosis present

## 2022-07-18 DIAGNOSIS — M5459 Other low back pain: Secondary | ICD-10-CM | POA: Diagnosis present

## 2022-07-18 DIAGNOSIS — M4316 Spondylolisthesis, lumbar region: Secondary | ICD-10-CM | POA: Diagnosis present

## 2022-07-18 DIAGNOSIS — M4317 Spondylolisthesis, lumbosacral region: Secondary | ICD-10-CM | POA: Diagnosis present

## 2022-07-18 DIAGNOSIS — G8929 Other chronic pain: Secondary | ICD-10-CM | POA: Diagnosis present

## 2022-07-18 DIAGNOSIS — M47817 Spondylosis without myelopathy or radiculopathy, lumbosacral region: Secondary | ICD-10-CM | POA: Diagnosis not present

## 2022-07-18 MED ORDER — LIDOCAINE HCL 2 % IJ SOLN
INTRAMUSCULAR | Status: AC
  Filled 2022-07-18: qty 20

## 2022-07-18 MED ORDER — ROPIVACAINE HCL 2 MG/ML IJ SOLN
INTRAMUSCULAR | Status: AC
  Filled 2022-07-18: qty 20

## 2022-07-18 MED ORDER — MIDAZOLAM HCL 5 MG/5ML IJ SOLN
0.5000 mg | Freq: Once | INTRAMUSCULAR | Status: AC
  Administered 2022-07-18: 1 mg via INTRAVENOUS

## 2022-07-18 MED ORDER — HYDROCODONE-ACETAMINOPHEN 5-325 MG PO TABS: 1.0000 | ORAL_TABLET | Freq: Three times a day (TID) | ORAL | 0 refills | Status: AC | PRN

## 2022-07-18 MED ORDER — FENTANYL CITRATE (PF) 100 MCG/2ML IJ SOLN
INTRAMUSCULAR | Status: AC
  Filled 2022-07-18: qty 2

## 2022-07-18 MED ORDER — PENTAFLUOROPROP-TETRAFLUOROETH EX AERO
INHALATION_SPRAY | Freq: Once | CUTANEOUS | Status: AC
  Administered 2022-07-18: 30 via TOPICAL
  Filled 2022-07-18: qty 116

## 2022-07-18 MED ORDER — MIDAZOLAM HCL 5 MG/5ML IJ SOLN
INTRAMUSCULAR | Status: AC
  Filled 2022-07-18: qty 5

## 2022-07-18 MED ORDER — TRIAMCINOLONE ACETONIDE 40 MG/ML IJ SUSP
INTRAMUSCULAR | Status: AC
  Filled 2022-07-18: qty 1

## 2022-07-18 MED ORDER — LACTATED RINGERS IV SOLN: Freq: Once | INTRAVENOUS | Status: AC

## 2022-07-18 MED ORDER — ROPIVACAINE HCL 2 MG/ML IJ SOLN
9.0000 mL | Freq: Once | INTRAMUSCULAR | Status: AC
  Administered 2022-07-18: 9 mL via PERINEURAL

## 2022-07-18 MED ORDER — TRIAMCINOLONE ACETONIDE 40 MG/ML IJ SUSP
40.0000 mg | Freq: Once | INTRAMUSCULAR | Status: AC
  Administered 2022-07-18: 40 mg

## 2022-07-18 MED ORDER — LIDOCAINE HCL 2 % IJ SOLN
20.0000 mL | Freq: Once | INTRAMUSCULAR | Status: AC
  Administered 2022-07-18: 400 mg

## 2022-07-18 MED ORDER — FENTANYL CITRATE (PF) 100 MCG/2ML IJ SOLN: 25.0000 ug | INTRAMUSCULAR | Status: DC | PRN

## 2022-07-18 NOTE — Patient Instructions (Signed)

## 2022-07-18 NOTE — Progress Notes (Signed)
Safety precautions to be maintained throughout the outpatient stay will include: orient to surroundings, keep bed in low position, maintain call bell within reach at all times, provide assistance with transfer out of bed and ambulation.  

## 2022-07-18 NOTE — Progress Notes (Signed)
PROVIDER NOTE: Interpretation of information contained herein should be left to medically-trained personnel. Specific patient instructions are provided elsewhere under "Patient Instructions" section of medical record. This document was created in part using STT-dictation technology, any transcriptional errors that may result from this process are unintentional.  Patient: Shelley Mahoney Type: Established DOB: [birth year] MRN: [id number] PCP: Kirk Ruths, MD  Service: Procedure DOS: 07/18/2022 Setting: Ambulatory Location: Ambulatory outpatient facility Delivery: Face-to-face Provider: Gaspar Cola, MD Specialty: Interventional Pain Management Specialty designation: 09 Location: Outpatient facility Ref. Prov.: Milinda Pointer, MD       Interventional Therapy   Procedure: Lumbar Facet, Medial Branch Radiofrequency Ablation (RFA) #1  Laterality: Right (-RT)  Level: L2, L3, L4, L5, and S1 Medial Branch Level(s). These levels will denervate the L3-4, L4-5, and L5-S1 lumbar facet joints.  Imaging: Fluoroscopy-guided         Anesthesia: Local anesthesia (1-2% Lidocaine) Anxiolysis: IV Versed 1.0 mg Sedation: Moderate Sedation None required. No Fentanyl administered.         DOS: 07/18/2022  Performed by: Gaspar Cola, MD  Purpose: Therapeutic/Palliative Indications: Low back pain severe enough to impact quality of life or function. Indications: 1. Lumbar facet joint pain   2. Lumbosacral facet syndrome   3. Lumbar spondylolisthesis (L5-S1 3 mm Anterolisthesis) (Right: L3-4 9 mm translation)   4. Lumbar facet hypertrophy (Multilevel) (Bilateral)   5. Grade 1 Anterolisthesis of lumbosacral spine (L5/S1)   6. Chronic low back pain (1ry area of Pain) (Bilateral) (R=L) w/o sciatica   7. Spondylosis without myelopathy or radiculopathy, lumbosacral region   8. DDD (degenerative disc disease), lumbar   9. DJD (degenerative joint disease), lumbosacral   10. Lumbar facet  joint osteoarthritis    [name] has been dealing with the above chronic pain for longer than three months and has either failed to respond, was unable to tolerate, or simply did not get enough benefit from other more conservative therapies including, but not limited to: 1. Over-the-counter medications 2. Anti-inflammatory medications 3. Muscle relaxants 4. Membrane stabilizers 5. Opioids 6. Physical therapy and/or chiropractic manipulation 7. Modalities (Heat, ice, etc.) 8. Invasive techniques such as nerve blocks. Ms. Martucci has attained more than 50% relief of the pain from a series of diagnostic injections conducted in separate occasions.  Pain Score: Pre-procedure: 4 /10 Post-procedure: 0-No pain/10     Position / Prep / Materials:  Position: Prone  Prep solution: DuraPrep (Iodine Povacrylex [0.7% available iodine] and Isopropyl Alcohol, 74% w/w) Prep Area: Entire Lumbosacral Region (Lower back from mid-thoracic region to end of tailbone and from flank to flank.) Materials:  Tray: RFA (Radiofrequency) tray Needle(s):  Type: RFA (Teflon-coated radiofrequency ablation needles) Gauge (G): 22  Length: Regular (10cm) Qty: 5      Pre-op H&P Assessment:  Shelley Mahoney is a 89 y.o. (year old), female patient, seen today for interventional treatment. She  has a past surgical history that includes Thyroid surgery; Colonoscopy with propofol (N/A, 12/08/2014); Flexible bronchoscopy (N/A, 04/21/2016); Eye surgery; and Abdominal hysterectomy. Shelley Mahoney has a current medication list which includes the following prescription(s): aspirin, cholecalciferol, gabapentin, gabapentin, hydrocodone-acetaminophen, [START ON 07/25/2022] hydrocodone-acetaminophen, iron, levothyroxine, magnesium, preservision areds, pramipexole, primidone, probiotic product, and simvastatin, and the following Facility-Administered Medications: fentanyl. Her primarily concern today is the Back Pain (lower)  Initial Vital  Signs:  Pulse/HCG Rate: 76ECG Heart Rate: 74 (NSR) Temp: (!) 97.4 F (36.3 C) Resp: 16 BP: 119/60 SpO2: 98 %  BMI: Estimated body mass index is  27 kg/m as calculated from the following:   Height as of this encounter: 5' 7.5" (1.715 m).   Weight as of this encounter: 175 lb (79.4 kg).  Risk Assessment: Allergies: Reviewed. She has No Known Allergies.  Allergy Precautions: None required Coagulopathies: Reviewed. None identified.  Blood-thinner therapy: None at this time Active Infection(s): Reviewed. None identified. Shelley Mahoney is afebrile  Site Confirmation: Shelley Mahoney was asked to confirm the procedure and laterality before marking the site Procedure checklist: Completed Consent: Before the procedure and under the influence of no sedative(s), amnesic(s), or anxiolytics, the patient was informed of the treatment options, risks and possible complications. To fulfill our ethical and legal obligations, as recommended by the American Medical Association's Code of Ethics, I have informed the patient of my clinical impression; the nature and purpose of the treatment or procedure; the risks, benefits, and possible complications of the intervention; the alternatives, including doing nothing; the risk(s) and benefit(s) of the alternative treatment(s) or procedure(s); and the risk(s) and benefit(s) of doing nothing. The patient was provided information about the general risks and possible complications associated with the procedure. These may include, but are not limited to: failure to achieve desired goals, infection, bleeding, organ or nerve damage, allergic reactions, paralysis, and death. In addition, the patient was informed of those risks and complications associated to Spine-related procedures, such as failure to decrease pain; infection (i.e.: Meningitis, epidural or intraspinal abscess); bleeding (i.e.: epidural hematoma, subarachnoid hemorrhage, or any other type of intraspinal or peri-dural  bleeding); organ or nerve damage (i.e.: Any type of peripheral nerve, nerve root, or spinal cord injury) with subsequent damage to sensory, motor, and/or autonomic systems, resulting in permanent pain, numbness, and/or weakness of one or several areas of the body; allergic reactions; (i.e.: anaphylactic reaction); and/or death. Furthermore, the patient was informed of those risks and complications associated with the medications. These include, but are not limited to: allergic reactions (i.e.: anaphylactic or anaphylactoid reaction(s)); adrenal axis suppression; blood sugar elevation that in diabetics may result in ketoacidosis or comma; water retention that in patients with history of congestive heart failure may result in shortness of breath, pulmonary edema, and decompensation with resultant heart failure; weight gain; swelling or edema; medication-induced neural toxicity; particulate matter embolism and blood vessel occlusion with resultant organ, and/or nervous system infarction; and/or aseptic necrosis of one or more joints. Finally, the patient was informed that Medicine is not an exact science; therefore, there is also the possibility of unforeseen or unpredictable risks and/or possible complications that may result in a catastrophic outcome. The patient indicated having understood very clearly. We have given the patient no guarantees and we have made no promises. Enough time was given to the patient to ask questions, all of which were answered to the patient's satisfaction. Ms. Holtsclaw has indicated that she wanted to continue with the procedure. Attestation: I, the ordering provider, attest that I have discussed with the patient the benefits, risks, side-effects, alternatives, likelihood of achieving goals, and potential problems during recovery for the procedure that I have provided informed consent. Date  Time: 07/18/2022  8:22 AM   Pre-Procedure Preparation:  Monitoring: As per clinic protocol.  Respiration, ETCO2, SpO2, BP, heart rate and rhythm monitor placed and checked for adequate function Safety Precautions: Patient was assessed for positional comfort and pressure points before starting the procedure. Time-out: I initiated and conducted the "Time-out" before starting the procedure, as per protocol. The patient was asked to participate by confirming the accuracy of the "  Time Out" information. Verification of the correct person, site, and procedure were performed and confirmed by me, the nursing staff, and the patient. "Time-out" conducted as per Joint Commission's Universal Protocol (UP.01.01.01). Time: 0906  Description of Procedure:          Laterality: See above. Levels:  See above. Safety Precautions: Aspiration looking for blood return was conducted prior to all injections. At no point did we inject any substances, as a needle was being advanced. Before injecting, the patient was told to immediately notify me if she was experiencing any new onset of "ringing in the ears, or metallic taste in the mouth". No attempts were made at seeking any paresthesias. Safe injection practices and needle disposal techniques used. Medications properly checked for expiration dates. SDV (single dose vial) medications used. After the completion of the procedure, all disposable equipment used was discarded in the proper designated medical waste containers. Local Anesthesia: Protocol guidelines were followed. The patient was positioned over the fluoroscopy table. The area was prepped in the usual manner. The time-out was completed. The target area was identified using fluoroscopy. A 12-in long, straight, sterile hemostat was used with fluoroscopic guidance to locate the targets for each level blocked. Once located, the skin was marked with an approved surgical skin marker. Once all sites were marked, the skin (epidermis, dermis, and hypodermis), as well as deeper tissues (fat, connective tissue and muscle)  were infiltrated with a small amount of a short-acting local anesthetic, loaded on a 10cc syringe with a 25G, 1.5-in  Needle. An appropriate amount of time was allowed for local anesthetics to take effect before proceeding to the next step. Technical description of process:  Radiofrequency Ablation (RFA) L2 Medial Branch Nerve RFA: The target area for the L2 medial branch is at the junction of the postero-lateral aspect of the superior articular process and the superior, posterior, and medial edge of the transverse process of L3. Under fluoroscopic guidance, a Radiofrequency needle was inserted until contact was made with os over the superior postero-lateral aspect of the pedicular shadow (target area). Sensory and motor testing was conducted to properly adjust the position of the needle. Once satisfactory placement of the needle was achieved, the numbing solution was slowly injected after negative aspiration for blood. 2.0 mL of the nerve block solution was injected without difficulty or complication. After waiting for at least 3 minutes, the ablation was performed. Once completed, the needle was removed intact. L3 Medial Branch Nerve RFA: The target area for the L3 medial branch is at the junction of the postero-lateral aspect of the superior articular process and the superior, posterior, and medial edge of the transverse process of L4. Under fluoroscopic guidance, a Radiofrequency needle was inserted until contact was made with os over the superior postero-lateral aspect of the pedicular shadow (target area). Sensory and motor testing was conducted to properly adjust the position of the needle. Once satisfactory placement of the needle was achieved, the numbing solution was slowly injected after negative aspiration for blood. 2.0 mL of the nerve block solution was injected without difficulty or complication. After waiting for at least 3 minutes, the ablation was performed. Once completed, the needle was  removed intact. L4 Medial Branch Nerve RFA: The target area for the L4 medial branch is at the junction of the postero-lateral aspect of the superior articular process and the superior, posterior, and medial edge of the transverse process of L5. Under fluoroscopic guidance, a Radiofrequency needle was inserted until contact was made with  os over the superior postero-lateral aspect of the pedicular shadow (target area). Sensory and motor testing was conducted to properly adjust the position of the needle. Once satisfactory placement of the needle was achieved, the numbing solution was slowly injected after negative aspiration for blood. 2.0 mL of the nerve block solution was injected without difficulty or complication. After waiting for at least 3 minutes, the ablation was performed. Once completed, the needle was removed intact. L5 Medial Branch Nerve RFA: The target area for the L5 medial branch is at the junction of the postero-lateral aspect of the superior articular process of S1 and the superior, posterior, and medial edge of the sacral ala. Under fluoroscopic guidance, a Radiofrequency needle was inserted until contact was made with os over the superior postero-lateral aspect of the pedicular shadow (target area). Sensory and motor testing was conducted to properly adjust the position of the needle. Once satisfactory placement of the needle was achieved, the numbing solution was slowly injected after negative aspiration for blood. 2.0 mL of the nerve block solution was injected without difficulty or complication. After waiting for at least 3 minutes, the ablation was performed. Once completed, the needle was removed intact. S1 Medial Branch Nerve RFA: The target area for the S1 medial branch is located inferior to the junction of the S1 superior articular process and the L5 inferior articular process, posterior, inferior, and lateral to the 6 o'clock position of the L5-S1 facet joint, just superior to the  S1 posterior foramen. Under fluoroscopic guidance, the Radiofrequency needle was advanced until contact was made with os over the Target area. Sensory and motor testing was conducted to properly adjust the position of the needle. Once satisfactory placement of the needle was achieved, the numbing solution was slowly injected after negative aspiration for blood. 2.0 mL of the nerve block solution was injected without difficulty or complication. After waiting for at least 3 minutes, the ablation was performed. Once completed, the needle was removed intact. Radiofrequency lesioning (ablation):  Radiofrequency Generator: Medtronic AccurianTM AG 1000 RF Generator Sensory Stimulation Parameters: 50 Hz was used to locate & identify the nerve, making sure that the needle was positioned such that there was no sensory stimulation below 0.3 V or above 0.7 V. Motor Stimulation Parameters: 2 Hz was used to evaluate the motor component. Care was taken not to lesion any nerves that demonstrated motor stimulation of the lower extremities at an output of less than 2.5 times that of the sensory threshold, or a maximum of 2.0 V. Lesioning Technique Parameters: Standard Radiofrequency settings. (Not bipolar or pulsed.) Temperature Settings: 80 degrees C Lesioning time: 60 seconds Intra-operative Compliance: Compliant  Once the entire procedure was completed, the treated area was cleaned, making sure to leave some of the prepping solution back to take advantage of its long term bactericidal properties.    Illustration of the posterior view of the lumbar spine and the posterior neural structures. Laminae of L2 through S1 are labeled. DPRL5, dorsal primary ramus of L5; DPRS1, dorsal primary ramus of S1; DPR3, dorsal primary ramus of L3; FJ, facet (zygapophyseal) joint L3-L4; I, inferior articular process of L4; LB1, lateral branch of dorsal primary ramus of L1; IAB, inferior articular branches from L3 medial branch (supplies  L4-L5 facet joint); IBP, intermediate branch plexus; MB3, medial branch of dorsal primary ramus of L3; NR3, third lumbar nerve root; S, superior articular process of L5; SAB, superior articular branches from L4 (supplies L4-5 facet joint also); TP3, transverse process of L3.  Vitals:   07/18/22 0925 07/18/22 0930 07/18/22 0933 07/18/22 0942  BP: 132/74 120/68 120/64 116/67  Pulse:      Resp: '18 16 18   '$ Temp:      SpO2: 100% 100% 100% 100%  Weight:      Height:        Start Time: 0906 hrs. End Time: 0933 hrs.  Imaging Guidance (Spinal):          Type of Imaging Technique: Fluoroscopy Guidance (Spinal) Indication(s): Assistance in needle guidance and placement for procedures requiring needle placement in or near specific anatomical locations not easily accessible without such assistance. Exposure Time: Please see nurses notes. Contrast: None used. Fluoroscopic Guidance: I was personally present during the use of fluoroscopy. "Tunnel Vision Technique" used to obtain the best possible view of the target area. Parallax error corrected before commencing the procedure. "Direction-depth-direction" technique used to introduce the needle under continuous pulsed fluoroscopy. Once target was reached, antero-posterior, oblique, and lateral fluoroscopic projection used confirm needle placement in all planes. Images permanently stored in EMR.    Interpretation: No contrast injected. I personally interpreted the imaging intraoperatively. Adequate needle placement confirmed in multiple planes. Permanent images saved into the patient's record.  Antibiotic Prophylaxis:   Anti-infectives (From admission, onward)    None      Indication(s): None identified  Post-operative Assessment:  Post-procedure Vital Signs:  Pulse/HCG Rate: 7666 (NSR) Temp: (!) 97.4 F (36.3 C) Resp: 18 BP: 116/67 SpO2: 100 %  EBL: None  Complications: No immediate post-treatment complications observed by team, or  reported by patient.  Note: The patient tolerated the entire procedure well. A repeat set of vitals were taken after the procedure and the patient was kept under observation following institutional policy, for this type of procedure. Post-procedural neurological assessment was performed, showing return to baseline, prior to discharge. The patient was provided with post-procedure discharge instructions, including a section on how to identify potential problems. Should any problems arise concerning this procedure, the patient was given instructions to immediately contact us, at any time, without hesitation. In any case, we plan to contact the patient by telephone for a follow-up status report regarding this interventional procedure.  Comments:  No additional relevant information.  Plan of Care (POC)  Orders:  Orders Placed This Encounter  Procedures   Radiofrequency,Lumbar    Ms. Locust has been dealing with the above chronic pain for longer than three months and has either failed to respond, was unable to tolerate, or simply did not get enough benefit from other more conservative therapies including, but not limited to: 1. Over-the-counter medications 2. Anti-inflammatory medications 3. Muscle relaxants 4. Membrane stabilizers 5. Opioids 6. Physical therapy and/or chiropractic manipulation 7. Modalities (Heat, ice, etc.) 8. Invasive techniques such as nerve blocks. Ms. Voltz has attained more than 50% relief of the pain from a series of diagnostic injections conducted in separate occasions.    Scheduling Instructions:     Side(s): Right-sided     Level: L3-4, L4-5, and L5-S1 Facets (L2, L3, L4, L5, and S1 Medial Branch)     Sedation: With Sedation.     Timeframe: Today    Order Specific Question:   Where will this procedure be performed?    Answer:   [facility]   DG PAIN CLINIC C-ARM 1-60 MIN NO REPORT    Intraoperative interpretation by procedural physician at [place].    Standing Status:   Standing    Number of Occurrences:  1    Order Specific Question:   Reason for exam:    Answer:   Assistance in needle guidance and placement for procedures requiring needle placement in or near specific anatomical locations not easily accessible without such assistance.   Informed Consent Details: Physician/Practitioner Attestation; Transcribe to consent form and obtain patient signature    Nursing Order: Transcribe to consent form and obtain patient signature. Note: Always confirm laterality of pain with Ms. Winnie, before procedure.    Order Specific Question:   Physician/Practitioner attestation of informed consent for procedure/surgical case    Answer:   I, the physician/practitioner, attest that I have discussed with the patient the benefits, risks, side effects, alternatives, likelihood of achieving goals and potential problems during recovery for the procedure that I have provided informed consent.    Order Specific Question:   Procedure    Answer:   Lumbar Facet Radiofrequency Ablation    Order Specific Question:   Physician/Practitioner performing the procedure    Answer:   Ionia Schey A. Dossie Arbour, MD    Order Specific Question:   Indication/Reason    Answer:   Low Back Pain, with our without leg pain, due to Facet Joint Arthralgia (Joint Pain) known as Lumbar Facet Syndrome, secondary to Lumbar, and/or Lumbosacral Spondylosis (Arthritis of the Spine), without myelopathy or radiculopathy (Nerve Damage).   Provide equipment / supplies at bedside    Procedure tray: "Radiofrequency Tray" Additional material: Large hemostat (x1); Small hemostat (x1); Towels (x8); 4x4 sterile sponge pack (x1) Needle type: Teflon-coated Radiofrequency Needle (Disposable  single use) Size: Regular Quantity: 5    Standing Status:   Standing    Number of Occurrences:   1    Order Specific Question:   Specify    Answer:   Radiofrequency Tray   Chronic Opioid Analgesic:   None MME/day: 0 mg/day   Medications ordered for procedure: Meds ordered this encounter  Medications   lidocaine (XYLOCAINE) 2 % (with pres) injection 400 mg   pentafluoroprop-tetrafluoroeth (GEBAUERS) aerosol   lactated ringers infusion   midazolam (VERSED) 5 MG/5ML injection 0.5-2 mg    Make sure Flumazenil is available in the pyxis when using this medication. If oversedation occurs, administer 0.2 mg IV over 15 sec. If after 45 sec no response, administer 0.2 mg again over 1 min; may repeat at 1 min intervals; not to exceed 4 doses (1 mg)   fentaNYL (SUBLIMAZE) injection 25-50 mcg    Make sure Narcan is available in the pyxis when using this medication. In the event of respiratory depression (RR< 8/min): Titrate NARCAN (naloxone) in increments of 0.1 to 0.2 mg IV at 2-3 minute intervals, until desired degree of reversal.   ropivacaine (PF) 2 mg/mL (0.2%) (NAROPIN) injection 9 mL   triamcinolone acetonide (KENALOG-40) injection 40 mg   HYDROcodone-acetaminophen (NORCO/VICODIN) 5-325 MG tablet    Sig: Take 1 tablet by mouth every 8 (eight) hours as needed for up to 7 days for severe pain. Must last 7 days.    Dispense:  21 tablet    Refill:  0    For acute post-operative pain. Not to be refilled. Must last 7 days.   HYDROcodone-acetaminophen (NORCO/VICODIN) 5-325 MG tablet    Sig: Take 1 tablet by mouth every 8 (eight) hours as needed for up to 7 days for severe pain. Must last for 7 days.    Dispense:  21 tablet    Refill:  0    For acute post-operative pain. Not to be refilled.  Must last 7 days.   Medications administered: We administered lidocaine, pentafluoroprop-tetrafluoroeth, lactated ringers, midazolam, ropivacaine (PF) 2 mg/mL (0.2%), and triamcinolone acetonide.  See the medical record for exact dosing, route, and time of administration.  Follow-up plan:   Return in about 6 weeks (around 08/29/2022) for Proc-day (T,Th), (Face2F), (PPE).       Interventional Therapies   Risk  Complexity Considerations:   Advanced age   Planned  Pending:   Therapeutic right lumbar facet (L2-S1) medial branch RFA #1    Under consideration:   Possible bilateral lumbar facet RFA (L2-S1) #1  Possible bilateral spinal cord stimulator trial/implant    Completed:   Diagnostic bilateral lumbar facet MBB (L2-S1) x1 (05/04/2022) (100/100/100/100)  Diagnostic right lumbar facet (L2-S1) MBB x2 (06/08/2022) (100/100/50/50)    Completed by other providers:   Diagnostic/therapeutic bilateral S1 TFESI x5 (11/20/2019, 12/24/2019, 04/30/2020, 06/30/2020, 04/08/2021) by Girtha Hake, MD [facility] PMR)  Diagnostic bilateral L3, L4, and L5 MBB x2 (05/26/2021, 06/10/2021) by Girtha Hake, MD [facility] PMR)  Therapeutic bilateral L3, L4, and L5 MB RFA x1 (10/12/2021) by Sharlet Salina, DO [facility] [facility] PMR)    Therapeutic  Palliative (PRN) options:   None established        Recent Visits Date Type Provider Dept  06/27/22 Office Visit Milinda Pointer, MD [facility]  06/08/22 Procedure visit Milinda Pointer, MD [facility]  05/25/22 Office Visit Milinda Pointer, MD [facility]  05/04/22 Procedure visit Milinda Pointer, MD [facility]  04/24/22 Office Visit Milinda Pointer, MD [facility]  Showing recent visits within past 90 days and meeting all other requirements Today's Visits Date Type Provider Dept  07/18/22 Procedure visit Milinda Pointer, MD [facility]  Showing today's visits and meeting all other requirements Future Appointments Date Type Provider Dept  08/29/22 Appointment Milinda Pointer, MD [facility]  Showing future appointments within next 90 days and meeting all other requirements  Disposition: Discharge home  Discharge (Date  Time): 07/18/2022; 0950 hrs.   Primary Care Physician: Kirk Ruths, MD Location: [facility] Note by: Gaspar Cola,  MD (TTS technology used. I apologize for any typographical errors that were not detected and corrected.) Date: 07/18/2022; Time: 11:24 AM  Disclaimer:  Medicine is not an Chief Strategy Officer. The only guarantee in medicine is that nothing is guaranteed. It is important to note that the decision to proceed with this intervention was based on the information collected from the patient. The Data and conclusions were drawn from the patient's questionnaire, the interview, and the physical examination. Because the information was provided in large part by the patient, it cannot be guaranteed that it has not been purposely or unconsciously manipulated. Every effort has been made to obtain as much relevant data as possible for this evaluation. It is important to note that the conclusions that lead to this procedure are derived in large part from the available data. Always take into account that the treatment will also be dependent on availability of resources and existing treatment guidelines, considered by other Pain Management Practitioners as being common knowledge and practice, at the time of the intervention. For Medico-Legal purposes, it is also important to point out that variation in procedural techniques and pharmacological choices are the acceptable norm. The indications, contraindications, technique, and results of the above procedure should only be interpreted and judged by a Board-Certified Interventional Pain Specialist with extensive familiarity and expertise in the same exact procedure and technique.

## 2022-07-19 ENCOUNTER — Telehealth: Payer: Self-pay | Admitting: *Deleted

## 2022-07-19 NOTE — Telephone Encounter (Signed)
No problems post procedure. 

## 2022-08-08 ENCOUNTER — Ambulatory Visit: Payer: Medicare PPO | Admitting: Dermatology

## 2022-08-08 VITALS — BP 100/52 | HR 76

## 2022-08-08 DIAGNOSIS — C4362 Malignant melanoma of left upper limb, including shoulder: Secondary | ICD-10-CM | POA: Diagnosis not present

## 2022-08-08 DIAGNOSIS — L820 Inflamed seborrheic keratosis: Secondary | ICD-10-CM | POA: Diagnosis not present

## 2022-08-08 DIAGNOSIS — L821 Other seborrheic keratosis: Secondary | ICD-10-CM

## 2022-08-08 DIAGNOSIS — C439 Malignant melanoma of skin, unspecified: Secondary | ICD-10-CM | POA: Insufficient documentation

## 2022-08-08 DIAGNOSIS — Z86018 Personal history of other benign neoplasm: Secondary | ICD-10-CM

## 2022-08-08 DIAGNOSIS — D2271 Melanocytic nevi of right lower limb, including hip: Secondary | ICD-10-CM

## 2022-08-08 DIAGNOSIS — D229 Melanocytic nevi, unspecified: Secondary | ICD-10-CM

## 2022-08-08 DIAGNOSIS — D225 Melanocytic nevi of trunk: Secondary | ICD-10-CM

## 2022-08-08 DIAGNOSIS — Z1283 Encounter for screening for malignant neoplasm of skin: Secondary | ICD-10-CM

## 2022-08-08 DIAGNOSIS — L859 Epidermal thickening, unspecified: Secondary | ICD-10-CM

## 2022-08-08 DIAGNOSIS — D485 Neoplasm of uncertain behavior of skin: Secondary | ICD-10-CM

## 2022-08-08 DIAGNOSIS — L578 Other skin changes due to chronic exposure to nonionizing radiation: Secondary | ICD-10-CM

## 2022-08-08 DIAGNOSIS — L814 Other melanin hyperpigmentation: Secondary | ICD-10-CM

## 2022-08-08 HISTORY — DX: Malignant melanoma of skin, unspecified: C43.9

## 2022-08-08 NOTE — Progress Notes (Signed)
Follow-Up Visit   Subjective  Shelley Mahoney is a 89 y.o. female who presents for the following: Annual Exam.  The patient presents for Total-Body Skin Exam (TBSE) for skin cancer screening and mole check.  The patient has spots, moles and lesions to be evaluated, some may be new or changing. 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   The following portions of the chart were reviewed this encounter and updated as appropriate:       Review of Systems:  No other skin or systemic complaints except as noted in HPI or Assessment and Plan.  Objective  Well appearing patient in no apparent distress; mood and affect are within normal limits.  A full examination was performed including scalp, head, eyes, ears, nose, lips, neck, chest, axillae, abdomen, back, buttocks, bilateral upper extremities, bilateral lower extremities, hands, feet, fingers, toes, fingernails, and toenails. All findings within normal limits unless otherwise noted below.  R neck x 2, L cheek x 1 (3) Erythematous stuck-on, waxy papule or plaque  L post upper arm 1.3 cm waxy brown papule with irregular pigment.       central upper abdomen 3 cm waxy brown plaque with 1.5 cm waxy brown plaque inferior     L buttock; R 2nd toe Left buttock: 3-11mm brown macules   Right 2nd Toe: 4.44mm regular brown macule  Left Elbow Hyperkeratosis of the left elbow.    Assessment & Plan  Skin cancer screening performed today. Actinic Damage - chronic, secondary to cumulative UV radiation exposure/sun exposure over time - diffuse scaly erythematous macules with underlying dyspigmentation - Recommend daily broad spectrum sunscreen SPF 30+ to sun-exposed areas, reapply every 2 hours as needed.  - Recommend staying in the shade or wearing long sleeves, sun glasses (UVA+UVB protection) and wide brim hats (4-inch brim around the entire circumference of the hat). - Call for  new or changing lesions.  History of Dysplastic Nevus - right upper forehead - No evidence of recurrence today - Recommend regular full body skin exams - Recommend daily broad spectrum sunscreen SPF 30+ to sun-exposed areas, reapply every 2 hours as needed.  - Call if any new or changing lesions are noted between office visits  Melanocytic Nevi - Tan-brown and/or pink-flesh-colored symmetric macules and papules - Benign appearing on exam today - Observation - Call clinic for new or changing moles - Recommend daily use of broad spectrum spf 30+ sunscreen to sun-exposed areas.   Seborrheic Keratoses - Stuck-on, waxy, tan-brown papules and/or plaques  - Benign-appearing - Discussed benign etiology and prognosis. - Observe - Call for any changes  Lentigines - Scattered tan macules - Due to sun exposure - Benign-appearing, observe - Recommend daily broad spectrum sunscreen SPF 30+ to sun-exposed areas, reapply every 2 hours as needed. - Call for any changes  Inflamed seborrheic keratosis (3) R neck x 2, L cheek x 1  Symptomatic, irritating, patient would like treated.  Destruction of lesion - R neck x 2, L cheek x 1  Destruction method: cryotherapy   Informed consent: discussed and consent obtained   Lesion destroyed using liquid nitrogen: Yes   Region frozen until ice ball extended beyond lesion: Yes   Outcome: patient tolerated procedure well with no complications   Post-procedure details: wound care instructions given   Additional details:  Prior to procedure, discussed risks of blister formation, small wound, skin dyspigmentation, or rare scar following cryotherapy. Recommend Vaseline ointment to  treated areas while healing.   Neoplasm of uncertain behavior of skin (2) L post upper arm  Epidermal / dermal shaving  Lesion diameter (cm):  1.5 Informed consent: discussed and consent obtained   Patient was prepped and draped in usual sterile fashion: Area prepped with  alcohol. Anesthesia: the lesion was anesthetized in a standard fashion   Anesthetic:  1% lidocaine w/ epinephrine 1-100,000 buffered w/ 8.4% NaHCO3 Instrument used: flexible razor blade   Hemostasis achieved with: pressure, aluminum chloride and electrodesiccation   Outcome: patient tolerated procedure well   Post-procedure details: wound care instructions given   Post-procedure details comment:  Ointment and small bandage applied Additional details:  Shaved down to fat.  Specimen 1 - Surgical pathology Differential Diagnosis: Sk vs Nevus r/o atypia/melanoma Check Margins: Yes  central upper abdomen  Epidermal / dermal shaving  Lesion diameter (cm):  3 Lesion diameter (cm) comment:  And 1.5 cm (two adjacent lesions submitted together in one bottle) Informed consent: discussed and consent obtained   Patient was prepped and draped in usual sterile fashion: Area prepped with alcohol. Anesthesia: the lesion was anesthetized in a standard fashion   Anesthetic:  1% lidocaine w/ epinephrine 1-100,000 buffered w/ 8.4% NaHCO3 Instrument used: flexible razor blade   Hemostasis achieved with: pressure, aluminum chloride and electrodesiccation   Outcome: patient tolerated procedure well   Post-procedure details: wound care instructions given   Post-procedure details comment:  Ointment and small bandage applied  Specimen 2 - Surgical pathology Differential Diagnosis: Irritated SK vs other Check Margins: No    Nevus L buttock; R 2nd toe  Benign-appearing, stable.  Observation.  Call clinic for new or changing moles.  Recommend daily use of broad spectrum spf 30+ sunscreen to sun-exposed areas.   Hyperkeratosis Left Elbow  Benign, observe.    Recommend starting moisturizer with exfoliant (Urea, Salicylic acid, or Lactic acid) one to two times daily to help smooth rough and bumpy skin.  OTC options include Cetaphil Rough and Bumpy lotion (Urea), Eucerin Roughness Relief lotion or spot  treatment cream (Urea), CeraVe SA lotion/cream for Rough and Bumpy skin (Sal Acid), Gold Bond Rough and Bumpy cream (Sal Acid), and AmLactin 12% lotion/cream (Lactic Acid).  If applying in morning, also apply sunscreen to sun-exposed areas, since these exfoliating moisturizers can increase sensitivity to sun.    Return in about 1 year (around 08/08/2023) for TBSE, Hx Dysplastic Nevus or pending biopsy results.  IJamesetta Orleans, CMA, am acting as scribe for Brendolyn Patty, MD .  Documentation: I have reviewed the above documentation for accuracy and completeness, and I agree with the above.  Brendolyn Patty MD

## 2022-08-08 NOTE — Patient Instructions (Addendum)
Wound Care Instructions  Cleanse wound gently with soap and water once a day then pat dry with clean gauze. Apply a thin coat of Petrolatum (petroleum jelly, "Vaseline") over the wound (unless you have an allergy to this). We recommend that you use a new, sterile tube of Vaseline. Do not pick or remove scabs. Do not remove the yellow or white "healing tissue" from the base of the wound.  Cover the wound with fresh, clean, nonstick gauze and secure with paper tape. You may use Band-Aids in place of gauze and tape if the wound is small enough, but would recommend trimming much of the tape off as there is often too much. Sometimes Band-Aids can irritate the skin.  You should call the office for your biopsy report after 1 week if you have not already been contacted.  If you experience any problems, such as abnormal amounts of bleeding, swelling, significant bruising, significant pain, or evidence of infection, please call the office immediately.  FOR ADULT SURGERY PATIENTS: If you need something for pain relief you may take 1 extra strength Tylenol (acetaminophen) AND 2 Ibuprofen (200mg  each) together every 4 hours as needed for pain. (do not take these if you are allergic to them or if you have a reason you should not take them.) Typically, you may only need pain medication for 1 to 3 days.     Recommend starting moisturizer with exfoliant (Urea, Salicylic acid, or Lactic acid) one to two times daily to help smooth rough and bumpy skin.  OTC options include Cetaphil Rough and Bumpy lotion (Urea), Eucerin Roughness Relief lotion or spot treatment cream (Urea), CeraVe SA lotion/cream for Rough and Bumpy skin (Sal Acid), Gold Bond Rough and Bumpy cream (Sal Acid), and AmLactin 12% lotion/cream (Lactic Acid).  If applying in morning, also apply sunscreen to sun-exposed areas, since these exfoliating moisturizers can increase sensitivity to sun.   Cryotherapy Aftercare  Wash gently with soap and water  everyday.   Apply Vaseline and Band-Aid daily until healed.    Due to recent changes in healthcare laws, you may see results of your pathology and/or laboratory studies on MyChart before the doctors have had a chance to review them. We understand that in some cases there may be results that are confusing or concerning to you. Please understand that not all results are received at the same time and often the doctors may need to interpret multiple results in order to provide you with the best plan of care or course of treatment. Therefore, we ask that you please give [place] 2 business days to thoroughly review all your results before contacting the office for clarification. Should we see a critical lab result, you will be contacted sooner.   If You Need Anything After Your Visit  If you have any questions or concerns for your doctor, please call our main line at [phone] and press option 4 to reach your doctor's medical assistant. If no one answers, please leave a voicemail as directed and we will return your call as soon as possible. Messages left after 4 pm will be answered the following business day.   You may also send [place] a message via [place]. We typically respond to MyChart messages within 1-2 business days.  For prescription refills, please ask your pharmacy to contact our office. Our fax number is [phone].  If you have an urgent issue when the clinic is closed that cannot wait until the next business day, you can page your doctor at  the number below.    Please note that while we do our best to be available for urgent issues outside of office hours, we are not available 24/7.   If you have an urgent issue and are unable to reach [place], you may choose to seek medical care at your doctor's office, retail clinic, urgent care center, or emergency room.  If you have a medical emergency, please immediately call 911 or go to the emergency department.  Pager Numbers  - Dr. Nehemiah Massed:  [phone]  - Dr. Laurence Ferrari: [phone]  - Dr. Nicole Kindred: [phone]  In the event of inclement weather, please call our main line at [phone] for an update on the status of any delays or closures.  Dermatology Medication Tips: Please keep the boxes that topical medications come in in order to help keep track of the instructions about where and how to use these. Pharmacies typically print the medication instructions only on the boxes and not directly on the medication tubes.   If your medication is too expensive, please contact our office at [phone] option 4 or send [place] a message through [place].   We are unable to tell what your co-pay for medications will be in advance as this is different depending on your insurance coverage. However, we may be able to find a substitute medication at lower cost or fill out paperwork to get insurance to cover a needed medication.   If a prior authorization is required to get your medication covered by your insurance company, please allow [place] 1-2 business days to complete this process.  Drug prices often vary depending on where the prescription is filled and some pharmacies may offer cheaper prices.  The website www.goodrx.com contains coupons for medications through different pharmacies. The prices here do not account for what the cost may be with help from insurance (it may be cheaper with your insurance), but the website can give you the price if you did not use any insurance.  - You can print the associated coupon and take it with your prescription to the pharmacy.  - You may also stop by our office during regular business hours and pick up a GoodRx coupon card.  - If you need your prescription sent electronically to a different pharmacy, notify our office through [facility] or by phone at [phone] option 4.     Si Usted Necesita Algo Despus de Su Visita  Tambin puede enviarnos un mensaje a travs de Pharmacist, community. Por lo general  respondemos a los mensajes de MyChart en el transcurso de 1 a 2 das hbiles.  Para renovar recetas, por favor pida a su farmacia que se ponga en contacto con nuestra oficina. Harland Dingwall de fax es [place] [phone].  Si tiene un asunto urgente cuando la clnica est cerrada y que no puede esperar hasta el siguiente da hbil, puede llamar/localizar a su doctor(a) al nmero que aparece a continuacin.   Por favor, tenga en cuenta que aunque hacemos todo lo posible para estar disponibles para asuntos urgentes fuera del horario de [place], no estamos disponibles las 24 horas del da, los 7 das de la [place].   Si tiene un problema urgente y no puede comunicarse con nosotros, puede optar por buscar atencin mdica  en el consultorio de su doctor(a), en una clnica privada, en un centro de atencin urgente o en una sala de emergencias.  Si tiene Engineering geologist, por favor llame inmediatamente al 911 o vaya a la sala de emergencias.  Nmeros  de bper  - Dr. Nehemiah Massed: [phone]  - Dra. Moye: [phone]  - Dra. Nicole Kindred: [phone]  En caso de inclemencias del [place], por favor llame a Johnsie Kindred principal al [phone] para una actualizacin sobre el [place] de cualquier retraso o cierre.  Consejos para la medicacin en dermatologa: Por favor, guarde las cajas en las que vienen los medicamentos de uso tpico para ayudarle a seguir las instrucciones sobre dnde y cmo usarlos. Las farmacias generalmente imprimen las instrucciones del medicamento slo en las cajas y no directamente en los tubos del [place].   Si su medicamento es muy caro, por favor, pngase en contacto con Zigmund Daniel llamando al [phone] y presione la opcin 4 o envenos un mensaje a travs de Pharmacist, community.   No podemos decirle cul ser su copago por los medicamentos por adelantado ya que esto es diferente dependiendo de la cobertura de su seguro. Sin embargo, es posible que podamos encontrar un  medicamento sustituto a Electrical engineer un formulario para que el seguro cubra el medicamento que se considera necesario.   Si se requiere una autorizacin previa para que su compaa de seguros [place] su medicamento, por favor permtanos de 1 a 2 das hbiles para completar este proceso.  Los precios de los medicamentos varan con frecuencia dependiendo del Environmental consultant de dnde se surte la receta y alguna farmacias pueden ofrecer precios ms baratos.  El sitio web www.goodrx.com tiene cupones para medicamentos de Airline pilot. Los precios aqu no tienen en cuenta lo que podra costar con la ayuda del seguro (puede ser ms barato con su seguro), pero el sitio web puede darle el precio si no utiliz Research scientist (physical sciences).  - Puede imprimir el cupn correspondiente y llevarlo con su receta a la farmacia.  - Tambin puede pasar por nuestra oficina durante el horario de atencin regular y Charity fundraiser una tarjeta de cupones de GoodRx.  - Si necesita que su receta se enve electrnicamente a una farmacia diferente, informe a nuestra oficina a travs de MyChart de [place] o por telfono llamando al [phone] y presione la opcin 4.

## 2022-08-16 ENCOUNTER — Telehealth: Payer: Self-pay

## 2022-08-16 NOTE — Telephone Encounter (Signed)
-----   Message from Brendolyn Patty, MD sent at 08/14/2022  2:32 PM EDT ----- 1. Skin (M), left post upper arm MALIGNANT MELANOMA, ARISING IN A NEVUS MELANOMA TABLE (AJCC 8TH EDITION#) PROCEDURE: EXCISION SPECIMEN ANATOMIC SITE: LEFT POST UPPER ARM HISTOLOGIC TYPE: SUPERFICIAL SPREADING, ARISING IN A NEVUS BRESLOW'S DEPTH/MAXIMUM TUMOR THICKNESS: 0.4 MM CLARK/ANATOMIC LEVEL: III MARGINS PERIPHERAL MARGINS: CROSS-SECTIONAL MARGIN <0.1 MM FROM MELANOMA DEEP MARGIN: FREE ULCERATION: ABSENT SATELLITOSIS: ABSENT MITOTIC INDEX: <1/MM2 (0) LYMPHO-VASCULAR INVASION: ABSENT NEUROTROPISM: ABSENT TUMOR-INFILTRATING LYMPHOCYTES: NON-BRISK TUMOR REGRESSION: ABSENT LYMPH NODES (IF APPLICABLE): N/A PATHOLOGIC STAGE: PT1A COMMENT: A RE-EXCISION IS RECOMMENDED. # AJCC 8TH EDITION: PT1A <0.8 W/O ULCER; PT1B <0.8 W/ ULCER OR 0.8-1.0 WITH OR W/O ULCER; PT2A >1.0-2.0 W/OUT ULCER; PT2B >1.0-2.0 W/ ULCER; PT3A >2.0-4.0 W/O ULCER; PT3B >2.0-4.0 W/ ULCER; PT4A >4.0 W/O ULCER; PT4B: >4.0 W/ ULCER. (AJCC MELANOMA EXPERT PANEL: CA CANCER J CLIN.2017 OCT 13) 2. Skin (M), central upper abdomen SEBORRHEIC KERATOSIS, IRRITATED  1. Melanoma skin cancer, B[place]low 0.4 mm, level III, needs WLE here in office, discussed results with pt.  Also discussed ordering [place] test and pt would like to proceed.  Please call to schedule.  2. ISK- benign

## 2022-08-16 NOTE — Telephone Encounter (Signed)
Called patient to confirm surgery appt 08/23/22.  Pt advised that appointment works for her.

## 2022-08-23 ENCOUNTER — Encounter: Payer: Self-pay | Admitting: Dermatology

## 2022-08-23 ENCOUNTER — Ambulatory Visit (INDEPENDENT_AMBULATORY_CARE_PROVIDER_SITE_OTHER): Payer: Medicare PPO | Admitting: Dermatology

## 2022-08-23 VITALS — BP 126/66

## 2022-08-23 DIAGNOSIS — C4362 Malignant melanoma of left upper limb, including shoulder: Secondary | ICD-10-CM

## 2022-08-23 DIAGNOSIS — C439 Malignant melanoma of skin, unspecified: Secondary | ICD-10-CM

## 2022-08-23 MED ORDER — MUPIROCIN 2 % EX OINT
TOPICAL_OINTMENT | CUTANEOUS | 1 refills | Status: AC
Start: 2022-08-23 — End: ?

## 2022-08-23 MED ORDER — DOXYCYCLINE MONOHYDRATE 100 MG PO CAPS
100.0000 mg | ORAL_CAPSULE | Freq: Two times a day (BID) | ORAL | 0 refills | Status: AC
Start: 2022-08-23 — End: 2022-08-30

## 2022-08-23 NOTE — Patient Instructions (Addendum)
Start Doxycycline 100 mg twice daily with food for 7 days.  Start Mupirocin ointment once daily with bandage change.  Doxycycline should be taken with food to prevent nausea. Do not lay down for 30 minutes after taking. Be cautious with sun exposure and use good sun protection while on this medication. Pregnant women should not take this medication.      Wound Care Instructions  On the day following your surgery, you should begin doing daily dressing changes: Remove the old dressing and discard it. Cleanse the wound gently with tap water. This may be done in the shower or by placing a wet gauze pad directly on the wound and letting it soak for several minutes. It is important to gently remove any dried blood from the wound in order to encourage healing. This may be done by gently rolling a moistened Q-tip on the dried blood. Do not pick at the wound. If the wound should start to bleed, continue cleaning the wound, then place a moist gauze pad on the wound and hold pressure for a few minutes.  Make sure you then dry the skin surrounding the wound completely or the tape will not stick to the skin. Do not use cotton balls on the wound. After the wound is clean and dry, apply the ointment gently with a Q-tip. Cut a non-stick pad to fit the size of the wound. Lay the pad flush to the wound. If the wound is draining, you may want to reinforce it with a small amount of gauze on top of the non-stick pad for a little added compression to the area. Use the tape to seal the area completely. Select from the following with respect to your individual situation: If your wound has been stitched closed: continue the above steps 1-8 at least daily until your sutures are removed. If your wound has been left open to heal: continue steps 1-8 at least daily for the first 3-4 weeks. We would like for you to take a few extra precautions for at least the next week. Sleep with your head elevated on pillows if our  wound is on your head. Do not bend over or lift heavy items to reduce the chance of elevated blood pressure to the wound Do not participate in particularly strenuous activities.   Below is a list of dressing supplies you might need.  Cotton-tipped applicators - Q-tips Gauze pads (2x2 and/or 4x4) - All-Purpose Sponges Non-stick dressing material - Telfa Tape - Paper or Hypafix New and clean tube of petroleum jelly - Vaseline    Comments on Post-Operative Period Slight swelling and redness often appear around the wound. This is normal and will disappear within several days following the surgery. The healing wound will drain a brownish-red-yellow discharge during healing. This is a normal phase of wound healing. As the wound begins to heal, the drainage may increase in amount. Again, this drainage is normal. Notify us if the drainage becomes persistently bloody, excessively swollen, or intensely painful or develops a foul odor or red streaks.  If you should experience mild discomfort during the healing phase, you may take an aspirin-free medication such as Tylenol (acetaminophen). Notify us if the discomfort is severe or persistent. Avoid alcoholic beverages when taking pain medicine.  In Case of Wound Hemorrhage A wound hemorrhage is when the bandage suddenly becomes soaked with bright red blood and flows profusely. If this happens, sit down or lie down with your head elevated. If the wound has a dressing on  it, do not remove the dressing. Apply pressure to the existing gauze. If the wound is not covered, use a gauze pad to apply pressure and continue applying the pressure for 20 minutes without peeking. DO NOT COVER THE WOUND WITH A LARGE TOWEL OR [place] CLOTH. Release your hand from the wound site but do not remove the dressing. If the bleeding has stopped, gently clean around the wound. Leave the dressing in place for 24 hours if possible. This wait time allows the blood vessels to close off so  that you do not spark a new round of bleeding by disrupting the newly clotted blood vessels with an immediate dressing change. If the bleeding does not subside, continue to hold pressure. If matters are out of your control, contact an After Hours clinic or go to the Emergency Room.  Due to recent changes in healthcare laws, you may see results of your pathology and/or laboratory studies on MyChart before the doctors have had a chance to review them. We understand that in some cases there may be results that are confusing or concerning to you. Please understand that not all results are received at the same time and often the doctors may need to interpret multiple results in order to provide you with the best plan of care or course of treatment. Therefore, we ask that you please give [place] 2 business days to thoroughly review all your results before contacting the office for clarification. Should we see a critical lab result, you will be contacted sooner.   If You Need Anything After Your Visit  If you have any questions or concerns for your doctor, please call our main line at [phone] and press option 4 to reach your doctor's medical assistant. If no one answers, please leave a voicemail as directed and we will return your call as soon as possible. Messages left after 4 pm will be answered the following business day.   You may also send [place] a message via [place]. We typically respond to MyChart messages within 1-2 business days.  For prescription refills, please ask your pharmacy to contact our office. Our fax number is [phone].  If you have an urgent issue when the clinic is closed that cannot wait until the next business day, you can page your doctor at the number below.    Please note that while we do our best to be available for urgent issues outside of office hours, we are not available 24/7.   If you have an urgent issue and are unable to reach [place], you may choose to seek medical care at  your doctor's office, retail clinic, urgent care center, or emergency room.  If you have a medical emergency, please immediately call 911 or go to the emergency department.  Pager Numbers  - Dr. Nehemiah Massed: [phone]  - Dr. Laurence Ferrari: [phone]  - Dr. Nicole Kindred: [phone]  In the event of inclement weather, please call our main line at [phone] for an update on the status of any delays or closures.  Dermatology Medication Tips: Please keep the boxes that topical medications come in in order to help keep track of the instructions about where and how to use these. Pharmacies typically print the medication instructions only on the boxes and not directly on the medication tubes.   If your medication is too expensive, please contact our office at [phone] option 4 or send [place] a message through [place].   We are unable to tell what your co-pay for medications will be  in advance as this is different depending on your insurance coverage. However, we may be able to find a substitute medication at lower cost or fill out paperwork to get insurance to cover a needed medication.   If a prior authorization is required to get your medication covered by your insurance company, please allow [place] 1-2 business days to complete this process.  Drug prices often vary depending on where the prescription is filled and some pharmacies may offer cheaper prices.  The website www.goodrx.com contains coupons for medications through different pharmacies. The prices here do not account for what the cost may be with help from insurance (it may be cheaper with your insurance), but the website can give you the price if you did not use any insurance.  - You can print the associated coupon and take it with your prescription to the pharmacy.  - You may also stop by our office during regular business hours and pick up a GoodRx coupon card.  - If you need your prescription sent electronically to a different pharmacy,  notify our office through [facility] or by phone at [phone] option 4.     Si Usted Necesita Algo Despus de Su Visita  Tambin puede enviarnos un mensaje a travs de Pharmacist, community. Por lo general respondemos a los mensajes de MyChart en el transcurso de 1 a 2 das hbiles.  Para renovar recetas, por favor pida a su farmacia que se ponga en contacto con nuestra oficina. Harland Dingwall de fax es [place] [phone].  Si tiene un asunto urgente cuando la clnica est cerrada y que no puede esperar hasta el siguiente da hbil, puede llamar/localizar a su doctor(a) al nmero que aparece a continuacin.   Por favor, tenga en cuenta que aunque hacemos todo lo posible para estar disponibles para asuntos urgentes fuera del horario de [place], no estamos disponibles las 24 horas del da, los 7 das de la [place].   Si tiene un problema urgente y no puede comunicarse con nosotros, puede optar por buscar atencin mdica  en el consultorio de su doctor(a), en una clnica privada, en un centro de atencin urgente o en una sala de emergencias.  Si tiene Engineering geologist, por favor llame inmediatamente al 911 o vaya a la sala de emergencias.  Nmeros de bper  - Dr. Nehemiah Massed: [phone]  - Dra. Moye: [phone]  - Dra. Nicole Kindred: [phone]  En caso de inclemencias del [place], por favor llame a Johnsie Kindred principal al [phone] para una actualizacin sobre el [place] de cualquier retraso o cierre.  Consejos para la medicacin en dermatologa: Por favor, guarde las cajas en las que vienen los medicamentos de uso tpico para ayudarle a seguir las instrucciones sobre dnde y cmo usarlos. Las farmacias generalmente imprimen las instrucciones del medicamento slo en las cajas y no directamente en los tubos del [place].   Si su medicamento es muy caro, por favor, pngase en contacto con Zigmund Daniel llamando al [phone] y presione la opcin 4 o envenos un mensaje a travs de  Pharmacist, community.   No podemos decirle cul ser su copago por los medicamentos por adelantado ya que esto es diferente dependiendo de la cobertura de su seguro. Sin embargo, es posible que podamos encontrar un medicamento sustituto a Electrical engineer un formulario para que el seguro cubra el medicamento que se considera necesario.   Si se requiere una autorizacin previa para que su compaa de seguros [place] su medicamento, por favor permtanos de 1 a  2 das hbiles para completar este proceso.  Los precios de los medicamentos varan con frecuencia dependiendo del lugar de dnde se surte la receta y alguna farmacias pueden ofrecer precios ms baratos.  El sitio web www.goodrx.com tiene cupones para medicamentos de diferentes farmacias. Los precios aqu no tienen en cuenta lo que podra costar con la ayuda del seguro (puede ser ms barato con su seguro), pero el sitio web puede darle el precio si no utiliz ningn seguro.  - Puede imprimir el cupn correspondiente y llevarlo con su receta a la farmacia.  - Tambin puede pasar por nuestra oficina durante el horario de atencin regular y recoger una tarjeta de cupones de GoodRx.  - Si necesita que su receta se enve electrnicamente a una farmacia diferente, informe a nuestra oficina a travs de MyChart de [place] o por telfono llamando al [phone] y presione la opcin 4.  

## 2022-08-23 NOTE — Progress Notes (Signed)
Follow-Up Visit   Subjective  Shelley Mahoney is a 89 y.o. 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   The following portions of the chart were reviewed this encounter and updated as appropriate: medications, allergies, medical history  Review of Systems:  No other skin or systemic complaints except as noted in HPI or Assessment and Plan.  Objective  Well appearing patient in no apparent distress; mood and affect are within normal limits.   A focused examination was performed of the following areas: Left posterior upper arm  Relevant exam findings are noted in the Assessment and Plan.  L post upper arm Pink ulcerated bx site    Assessment & Plan     Melanoma of skin L post upper arm  Skin excision  Lesion length (cm):  1.3 Lesion width (cm):  1.3 Margin per side (cm):  1 Total excision diameter (cm):  3.3 Informed consent: discussed and consent obtained   Timeout: patient name, date of birth, surgical site, and procedure verified   Procedure prep:  Patient was prepped and draped in usual sterile fashion Prep type:  Povidone-iodine Anesthesia: the lesion was anesthetized in a standard fashion   Anesthetic:  1% lidocaine w/ epinephrine 1-100,000 buffered w/ 8.4% NaHCO3 (Total 21cc - 12cc lido w/epi, 9cc bupivicaine) Instrument used: #15 blade   Hemostasis achieved with: pressure and electrodesiccation   Outcome: patient tolerated procedure well with no complications    Skin repair Complexity:  Complex Final length (cm):  8.3 Informed consent: discussed and consent obtained   Reason for type of repair: reduce tension to allow closure, reduce the risk of dehiscence, infection, and necrosis, reduce subcutaneous dead space and avoid a hematoma, allow closure of the large defect, preserve normal anatomical and functional relationships and enhance both functionality and cosmetic results   Undermining: area  extensively undermined   Undermining comment:  3.3 cm Subcutaneous layers (deep stitches):  Suture size:  3-0 Suture type: Vicryl (polyglactin 910)   Stitches:  Buried vertical mattress Fine/surface layer approximation (top stitches):  Suture size:  4-0 and 3-0 Suture type: nylon   Suture type comment:  Nylon Stitches: simple interrupted   Suture removal (days):  7 Hemostasis achieved with: suture Outcome: patient tolerated procedure well with no complications   Post-procedure details: sterile dressing applied and wound care instructions given   Dressing type: pressure dressing (Mupirocin)    doxycycline (MONODOX) 100 MG capsule Take 1 capsule (100 mg total) by mouth 2 (two) times daily for 7 days. Take with food  mupirocin ointment (BACTROBAN) 2 % Apply daily with bandage change.  Specimen 1 - Surgical pathology Differential Diagnosis: D48.5 Bx proven Melanoma Check Margins: yes Pink bx site [phone] Tagged at superior 12 o'clock   [place] 0.4 mm, level III, bx proven, excised today with 1 cm margins  Start Doxycycline 100 mg twice daily with food for 7 days.  Start Mupirocin ointment once daily with bandage change.  Doxycycline should be taken with food to prevent nausea. Do not lay down for 30 minutes after taking. Be cautious with sun exposure and use good sun protection while on this medication. Pregnant women should not take this medication.       Return in about 1 week (around 08/30/2022) for suture removal.  I, Jamesetta Orleans, CMA, am acting as scribe for Brendolyn Patty, MD .   Documentation: I have reviewed the above documentation for accuracy and  completeness, and I agree with the above.  Brendolyn Patty, MD

## 2022-08-24 ENCOUNTER — Telehealth: Payer: Self-pay

## 2022-08-24 NOTE — Telephone Encounter (Signed)
[place] Testing results scanned in under the media tab for your review

## 2022-08-24 NOTE — Telephone Encounter (Signed)
Pt doing fine after yesterdays surgery.

## 2022-08-29 ENCOUNTER — Ambulatory Visit: Payer: Medicare PPO | Attending: Pain Medicine | Admitting: Pain Medicine

## 2022-08-29 ENCOUNTER — Encounter: Payer: Self-pay | Admitting: Pain Medicine

## 2022-08-29 VITALS — BP 123/73 | HR 79 | Temp 97.2°F | Resp 18 | Ht 67.5 in | Wt 175.0 lb

## 2022-08-29 DIAGNOSIS — M5459 Other low back pain: Secondary | ICD-10-CM

## 2022-08-29 DIAGNOSIS — Z09 Encounter for follow-up examination after completed treatment for conditions other than malignant neoplasm: Secondary | ICD-10-CM | POA: Insufficient documentation

## 2022-08-29 DIAGNOSIS — G8929 Other chronic pain: Secondary | ICD-10-CM | POA: Diagnosis not present

## 2022-08-29 DIAGNOSIS — M47817 Spondylosis without myelopathy or radiculopathy, lumbosacral region: Secondary | ICD-10-CM | POA: Insufficient documentation

## 2022-08-29 DIAGNOSIS — M545 Low back pain, unspecified: Secondary | ICD-10-CM | POA: Diagnosis not present

## 2022-08-29 NOTE — Patient Instructions (Signed)
  ____________________________________________________________________________________________  Patient Information update  To: All of our patients.  Re: Name change.  It has been made official that our current name, "[facility]"   will soon be changed to "[facility]".   The purpose of this change is to eliminate any confusion created by the concept of our practice being a "Medication Management Pain Clinic". In the past this has led to the misconception that we treat pain primarily by the use of prescription medications.  Nothing can be farther from the truth.   Understanding PAIN MANAGEMENT: To further understand what our practice does, you first have to understand that "Pain Management" is a subspecialty that requires additional training once a physician has completed their specialty training, which can be in either Anesthesia, Neurology, Psychiatry, or Physical Medicine and Rehabilitation (PMR). Each one of these contributes to the final approach taken by each physician to the management of their patient's pain. To be a "Pain Management Specialist" you must have first completed one of the specialty trainings below.  Anesthesiologists - trained in clinical pharmacology and interventional techniques such as nerve blockade and regional as well as central neuroanatomy. They are trained to block pain before, during, and after surgical interventions.  Neurologists - trained in the diagnosis and pharmacological treatment of complex neurological conditions, such as Multiple Sclerosis, Parkinson's, spinal cord injuries, and other systemic conditions that may be associated with symptoms that may include but are not limited to pain. They tend to rely primarily on the treatment of chronic pain using prescription medications.  Psychiatrist - trained in conditions affecting the psychosocial  wellbeing of patients including but not limited to depression, anxiety, schizophrenia, personality disorders, addiction, and other substance use disorders that may be associated with chronic pain. They tend to rely primarily on the treatment of chronic pain using prescription medications.   Physical Medicine and Rehabilitation (PMR) physicians, also known as physiatrists - trained to treat a wide variety of medical conditions affecting the brain, spinal cord, nerves, bones, joints, ligaments, muscles, and tendons. Their training is primarily aimed at treating patients that have suffered injuries that have caused severe physical impairment. Their training is primarily aimed at the physical therapy and rehabilitation of those patients. They may also work alongside orthopedic surgeons or neurosurgeons using their expertise in assisting surgical patients to recover after their surgeries.  INTERVENTIONAL PAIN MANAGEMENT is sub-subspecialty of Pain Management.  Our physicians are Board-certified in Anesthesia, Pain Management, and Interventional Pain Management.  This meaning that not only have they been trained and Board-certified in their specialty of Anesthesia, and subspecialty of Pain Management, but they have also received further training in the sub-subspecialty of Interventional Pain Management, in order to become Board-certified as INTERVENTIONAL PAIN MANAGEMENT SPECIALIST.    Mission: Our goal is to use our skills in  INTERVENTIONAL PAIN MANAGEMENT as alternatives to the chronic use of prescription opioid medications for the treatment of pain. To make this more clear, we have changed our name to reflect what we do and offer. We will continue to offer medication management assessment and recommendations, but we will not be taking over any patient's medication management.  ____________________________________________________________________________________________     

## 2022-08-29 NOTE — Progress Notes (Signed)
PROVIDER NOTE: Information contained herein reflects review and annotations entered in association with encounter. Interpretation of such information and data should be left to medically-trained personnel. Information provided to patient can be located elsewhere in the medical record under "Patient Instructions". Document created using STT-dictation technology, any transcriptional errors that may result from process are unintentional.    Patient: Shelley Mahoney  Service Category: E/M  Provider: Oswaldo Done, MD  DOB: 1932-05-16  DOS: 08/29/2022  Referring Provider: Lauro Regulus, MD  MRN: [id number]  Specialty: Interventional Pain Management  PCP: Lauro Regulus, MD  Type: Established Patient  Setting: Ambulatory outpatient    Location: Office  Delivery: Face-to-face     HPI  Shelley Mahoney, a 89 y.o. year old female, is here today because of her Chronic bilateral low back pain without sciatica [M54.50, G89.29]. Shelley Mahoney primary complain today is Back Pain  Pertinent problems: Shelley Mahoney has Mass of upper lobe of left lung; Primary cancer of left upper lobe of lung; Malignant neoplasm of upper lobe of left lung; Neuropathy; Lung cancer; Chronic low back pain (1ry area of Pain) (Bilateral) (R=L) w/o sciatica; Lumbar stenosis without neurogenic claudication; Status post deep brain stimulator placement; Strain of right knee; Chronic pain syndrome; Abnormal CT scan, lumbar spine (10/28/2019); Grade 1 Anterolisthesis of lumbosacral spine (L5/S1); Chronic mid back pain; Lumbar scoliosis due to degenerative disease of spine in adult; Lumbar spondylolisthesis (L5-S1 3 mm Anterolisthesis) (Right: L3-4 9 mm translation); Lumbar facet hypertrophy (Multilevel) (Bilateral); Lumbar facet joint osteoarthritis; DDD (degenerative disc disease), lumbar; DJD (degenerative joint disease), lumbosacral; Ligamentum flavum hypertrophy (L2-3, L3-4, L4-5); Lumbosacral foraminal stenosis (Left: L2-3)  (Bilateral: L3-4) (L>R: L4-5, L5-S1); Lumbosacral lateral recess stenosis (Left: L2-3) (Bilateral: L3-4, L4-5, L5-S1); Spondylosis without myelopathy or radiculopathy, lumbosacral region; Dextroscoliosis of lumbar spine; Lumbar facet joint pain; Lumbosacral facet syndrome; Chronic feet pain (2ry area of Pain) (Bilateral); Abnormal NCS (nerve conduction studies) (LE) (04/04/2022); and Sensory polyneuropathy (lower extremites) on their pertinent problem list. Pain Assessment: Severity of Chronic pain is reported as a 1 /10. Location: Back Lower, Right/Radiates down to right hip and right buttocks area. Onset: More than a month ago. Quality: Aching, Sharp, Constant, Other (Comment) (stiff). Timing: Constant. Modifying factor(s): Sitting or laying down. Vitals:  height is 5' 7.5" (1.715 m) and weight is 175 lb (79.4 kg). Her temporal temperature is 97.2 F (36.2 C) (abnormal). Her blood pressure is 123/73 and her pulse is 79. Her respiration is 18 and oxygen saturation is 100%.  BMI: Estimated body mass index is 27 kg/m as calculated from the following:   Height as of this encounter: 5' 7.5" (1.715 m).   Weight as of this encounter: 175 lb (79.4 kg). Last encounter: 06/27/2022. Last procedure: 07/18/2022.  Reason for encounter: post-procedure evaluation and assessment.  Unfortunately, the patient indicates not having attained any long-term benefit from the radiofrequency ablation.  Further questioning indicates that she attained 100% relief of the pain for the duration of the local anesthetic which later wore off.  The procedure was done on 07/18/2022 and more than 6 weeks have passed since we did the radiofrequency and therefore she should be over there.  Discomfort.  Today she comes in and indicates that the pain has changed to a certain degree where she no longer is experiencing excruciating pain all the time.  Right now she states that the pain comes and goes depending on what she is doing.  She gave the  example of how  the pain feels at this very in-stent where she indicates is really not bothering her that much.  However, she also points out that at times that pain will come back, just like it was before.  For this reason she feels that she has not really attained any benefit.  During today's physical exam, I was unable to trigger the pain during hyperextension or rotation of the lumbar spine.  He was only when I did the [facility] maneuver on the right side that he did trigger some pain in the area that she was complaining about.  She denies any lower extremity pain, numbness, or weakness.  It is my personal impression that she seems to be able to stand up more straight as opposed to standing up with the lumbar spine flexed.  Provocative Patrick maneuver was only positive on the right side for decreased range of motion of the hip joint with some discomfort upon attempting to externally rotate the hip joint.  Today the patient had multiple questions including whether or not we had any idea why this pain had returned.  To answer this, I went ahead and pulled the images from the CT of the lumbar spine that she had done on 10/28/2019.  I went over those images and by that time the patient already had her L5-S1 facet joint, bone-on-bone.  I showed the images to the patient and it was very clear that there is no way that this condition will not continue given her problems for the rest of her life.  The images also made it clear why it was so difficult to get adequate stimulation of the median nerve during the time of the radiofrequency ablation.  Previously with the diagnostic lumbar facet blocks the patient had indicated attaining 100% relief of the pain that seem to continue past the 2-week mark when she would be returning for the postprocedure evaluation.  For this reason, today I have offered to bring her back and do a very localized injection into the area of the L5-S1 facet joint to see if that would help.  Today  was very clear to me that the patient did not really know what to do next, even though I made it clear that based on the evidence, it is unlikely that she will ever attain 100% relief of her pain, especially on the right lower back around that L5-S1 facet.  According to the 10/28/2019 lumbar CT, at the L5-S1 the patient has advanced facet arthropathy, right worse than left, with 3 mm of anterolisthesis.  There is also degeneration and bulging of the disc with stenosis of the subarticular lateral recess.  According to the CT either S1 nerve root could be affected.  There is also foraminal stenosis on the right worse than the left that could affect in particular the right L5 nerve root.  In addition to the above, the patient also wanted some additional information regarding some terms that she was not familiar with.  It is at this point that she began to ask about "stenosis", "degenerative disc disease", "degenerative joint disease" "anterolisthesis", "spondylolisthesis", "disorders of skeletal system", etc.  I ended up spending quite a bit of time with the patient going over some of these things up to the point where I had to clarify to her that these were "descriptive medical terms", with very specific meaning and that I was not can be able to sit down with her and translated the entire medical record into layman's terms.  I attempted to  simplify this for her by explaining that she has degenerative arthritis that is rather severe and affecting her spine and subsequently its nerves.  This took way more time than I felt was necessary.  Post-procedure evaluation   Procedure: Lumbar Facet, Medial Branch Radiofrequency Ablation (RFA) #1  Laterality: Right (-RT)  Level: L2, L3, L4, L5, and S1 Medial Branch Level(s). These levels will denervate the L3-4, L4-5, and L5-S1 lumbar facet joints.  Imaging: Fluoroscopy-guided         Anesthesia: Local anesthesia (1-2% Lidocaine) Anxiolysis: IV Versed 1.0 mg Sedation:  Moderate Sedation None required. No Fentanyl administered.         DOS: 07/18/2022  Performed by: Oswaldo Done, MD  Purpose: Therapeutic/Palliative Indications: Low back pain severe enough to impact quality of life or function.  Pain Score: Pre-procedure: 4 /10 Post-procedure: 0-No pain/10    Effectiveness:  Initial hour after procedure: 100 %. Subsequent 4-6 hours post-procedure: 100 %. Analgesia past initial 6 hours: 0 %. Ongoing improvement:  Analgesic: She describes no long-term benefit. Function: Back to baseline ROM: Back to baseline  Pharmacotherapy Assessment  Analgesic: No chronic opioid analgesics therapy prescribed by our practice. None MME/day: 0 mg/day   Monitoring: [place] PMP: PDMP reviewed during this encounter.       Pharmacotherapy: No side-effects or adverse reactions reported. Compliance: No problems identified. Effectiveness: Clinically acceptable.  Earlyne Iba, RN  08/29/2022  1:32 PM  Sign when Signing Visit Safety precautions to be maintained throughout the outpatient stay will include: orient to surroundings, keep bed in low position, maintain call bell within reach at all times, provide assistance with transfer out of bed and ambulation.     No results found for: "CBDTHCR" No results found for: "D8THCCBX" No results found for: "D9THCCBX"  UDS:  Summary  Date Value Ref Range Status  02/01/2022 Note  Final    Comment:    ==================================================================== Compliance Drug Analysis, Ur ==================================================================== Test                             Result       Flag       Units  Drug Present and Declared for Prescription Verification   Primidone                      PRESENT      EXPECTED   Phenobarbital                  PRESENT      EXPECTED    Phenobarbital is an expected metabolite of primidone; Phenobarbital    may also be administered as a prescription drug.     Gabapentin                     PRESENT      EXPECTED  Drug Present not Declared for Prescription Verification   Acetaminophen                  PRESENT      UNEXPECTED  Drug Absent but Declared for Prescription Verification   Salicylate                     Not Detected UNEXPECTED    Aspirin, as indicated in the declared medication list, is not always    detected even when used as directed.  ==================================================================== Test  Result    Flag   Units      Ref Range   Creatinine              28               mg/dL      >=16>=20 ==================================================================== Declared Medications:  The flagging and interpretation on this report are based on the  following declared medications.  Unexpected results may arise from  inaccuracies in the declared medications.   **Note: The testing scope of this panel includes these medications:   Gabapentin (Neurontin)  Primidone (Mysoline)   **Note: The testing scope of this panel does not include small to  moderate amounts of these reported medications:   Aspirin   **Note: The testing scope of this panel does not include the  following reported medications:   Iron  Levothyroxine (Synthroid)  Magnesium  Multivitamin  Pramipexole (Mirapex)  Probiotic  Simvastatin (Zocor)  Supplement (PreserVision)  Vitamin D ==================================================================== For clinical consultation, please call [phone]. ====================================================================       ROS  Constitutional: Denies any fever or chills Gastrointestinal: No reported hemesis, hematochezia, vomiting, or acute GI distress Musculoskeletal: Denies any acute onset joint swelling, redness, loss of ROM, or weakness Neurological: No reported episodes of acute onset apraxia, aphasia, dysarthria, agnosia, amnesia, paralysis, loss of coordination,  or loss of consciousness  Medication Review  Aspirin, Iron, Magnesium, PreserVision AREDS, Probiotic Product, cholecalciferol, doxycycline, gabapentin, levothyroxine, mupirocin ointment, pramipexole, primidone, and simvastatin  History Review  Allergy: Shelley Mahoney has No Known Allergies. Drug: Shelley Mahoney  reports no history of drug use. Alcohol:  reports no history of alcohol use. Tobacco:  reports that she has never smoked. She has never used smokeless tobacco. Social: Shelley Mahoney  reports that she has never smoked. She has never used smokeless tobacco. She reports that she does not drink alcohol and does not use drugs. Medical:  has a past medical history of Carotid stenosis, Carotid stenosis, Chronic kidney disease, CKD (chronic kidney disease), Dysplastic nevus (09/22/2019), Hypothyroidism, Lung cancer (2018), Melanoma (08/08/2022), Neuro-degenerative disorders, Personal history of radiation therapy, and Uterine cancer. Surgical: Shelley Mahoney  has a past surgical history that includes Thyroid surgery; Colonoscopy with propofol (N/A, 12/08/2014); Flexible bronchoscopy (N/A, 04/21/2016); Eye surgery; and Abdominal hysterectomy. Family: family history includes Breast cancer in her mother; Colon cancer in her maternal grandmother; Other in her father.  Laboratory Chemistry Profile   Renal Lab Results  Component Value Date   BUN 23 02/01/2022   CREATININE 1.00 02/01/2022   BCR 23 02/01/2022   GFRAA >60 04/28/2016   GFRNONAA 47 (L) 04/09/2021    Hepatic Lab Results  Component Value Date   AST 21 02/01/2022   ALT 23 04/09/2021   ALBUMIN 4.4 02/01/2022   ALKPHOS 74 02/01/2022   LIPASE 41 04/09/2021    Electrolytes Lab Results  Component Value Date   NA 138 02/01/2022   K 4.7 02/01/2022   CL 102 02/01/2022   CALCIUM 8.9 02/01/2022   MG 2.6 (H) 02/01/2022    Bone Lab Results  Component Value Date   25OHVITD1 47 02/01/2022   25OHVITD2 <1.0 02/01/2022   25OHVITD3 47 02/01/2022     Inflammation (CRP: Acute Phase) (ESR: Chronic Phase) Lab Results  Component Value Date   CRP 1 02/01/2022   ESRSEDRATE 20 02/01/2022         Note: Above Lab results reviewed.  Recent Imaging Review  DG PAIN CLINIC C-ARM 1-60 MIN NO REPORT  Fluoro was used, but no Radiologist interpretation will be provided.  Please refer to "NOTES" tab for provider progress note. Note: Reviewed        Physical Exam  General appearance: Well nourished, well developed, and well hydrated. In no apparent acute distress Mental status: Alert, oriented x 3 (person, place, & time)       Respiratory: No evidence of acute respiratory distress Eyes: PERLA Vitals: BP 123/73   Pulse 79   Temp (!) 97.2 F (36.2 C) (Temporal)   Resp 18   Ht 5' 7.5" (1.715 m)   Wt 175 lb (79.4 kg)   SpO2 100%   BMI 27.00 kg/m  BMI: Estimated body mass index is 27 kg/m as calculated from the following:   Height as of this encounter: 5' 7.5" (1.715 m).   Weight as of this encounter: 175 lb (79.4 kg). Ideal: Ideal body weight: 62.7 kg (138 lb 5.4 oz) Adjusted ideal body weight: 69.4 kg (153 lb)  Assessment   Diagnosis Status  1. Chronic low back pain (1ry area of Pain) (Bilateral) (R=L) w/o sciatica   2. Lumbosacral facet syndrome   3. Lumbar facet joint pain   4. Postop check    Controlled Controlled Controlled   Updated Problems: No problems updated.  Plan of Care  Problem-specific:  No problem-specific Assessment & Plan notes found for this encounter.  Shelley Mahoney has a current medication list which includes the following long-term medication(s): levothyroxine, pramipexole, primidone, and simvastatin.  Pharmacotherapy (Medications Ordered): No orders of the defined types were placed in this encounter.  Orders:  Orders Placed This Encounter  Procedures   Nursing Instructions:    Please complete this patient's postprocedure evaluation.    Scheduling Instructions:     Please complete this  patient's postprocedure evaluation.   Follow-up plan:   No follow-ups on file.      Interventional Therapies  Risk Factors  Considerations:   Advanced age  unrealistic expectations  Carotid artery disease  Stage III CKD  aortic atherosclerosis                Planned  Pending:   Therapeutic right L5-S1 facet joint injection #1    Under consideration:   Possible left lumbar facet RFA (L2-S1) #1  Possible bilateral spinal cord stimulator trial/implant    Completed:   Therapeutic right lumbar facet (L2-S1) RFA x1 (07/18/2022) (100/100/0/0) Diagnostic bilateral lumbar facet MBB (L2-S1) x1 (05/04/2022) (100/100/100/100)  Diagnostic right lumbar facet (L2-S1) MBB x2 (06/08/2022) (100/100/50/50)    Completed by other providers:   Diagnostic/therapeutic bilateral S1 TFESI x5 (11/20/2019, 12/24/2019, 04/30/2020, 06/30/2020, 04/08/2021) by Filomena Jungling, MD [facility])  Diagnostic bilateral L3, L4, and L5 MBB x2 (05/26/2021, 06/10/2021) by Filomena Jungling, MD [facility])  Therapeutic bilateral L3, L4, and L5 MB RFA x1 (10/12/2021) by Merri Ray, DO [facility])    Therapeutic  Palliative (PRN) options:   None established         Recent Visits Date Type Provider Dept  07/18/22 Procedure visit Delano Metz, MD [facility]  06/27/22 Office Visit Delano Metz, MD [facility]  06/08/22 Procedure visit Delano Metz, MD [facility]  Showing recent visits within past 90 days and meeting all other requirements Today's Visits Date Type Provider Dept  08/29/22 Office Visit Delano Metz, MD [facility]  Showing today's visits and meeting all other requirements Future Appointments No visits were found meeting these conditions. Showing future appointments within next 90  days and meeting all other requirements  I discussed the assessment and treatment plan with the patient. The patient was provided an opportunity to ask  questions and all were answered. The patient agreed with the plan and demonstrated an understanding of the instructions.  Patient advised to call back or seek an in-person evaluation if the symptoms or condition worsens.  Duration of encounter: 79 minutes.  Total time on encounter, as per AMA guidelines included both the face-to-face and non-face-to-face time personally spent by the physician and/or other qualified health care professional(s) on the day of the encounter (includes time in activities that require the physician or other qualified health care professional and does not include time in activities normally performed by clinical staff). Physician's time may include the following activities when performed: Preparing to see the patient (e.g., pre-charting review of records, searching for previously ordered imaging, lab work, and nerve conduction tests) Review of prior analgesic pharmacotherapies. Reviewing PMP Interpreting ordered tests (e.g., lab work, imaging, nerve conduction tests) Performing post-procedure evaluations, including interpretation of diagnostic procedures Obtaining and/or reviewing separately obtained history Performing a medically appropriate examination and/or evaluation Counseling and educating the patient/family/caregiver Ordering medications, tests, or procedures Referring and communicating with other health care professionals (when not separately reported) Documenting clinical information in the electronic or other health record Independently interpreting results (not separately reported) and communicating results to the patient/ family/caregiver Care coordination (not separately reported)  Note by: Oswaldo Done, MD Date: 08/29/2022; Time: 4:13 PM

## 2022-08-29 NOTE — Progress Notes (Signed)
Safety precautions to be maintained throughout the outpatient stay will include: orient to surroundings, keep bed in low position, maintain call bell within reach at all times, provide assistance with transfer out of bed and ambulation.  

## 2022-08-30 ENCOUNTER — Ambulatory Visit (INDEPENDENT_AMBULATORY_CARE_PROVIDER_SITE_OTHER): Payer: Medicare PPO | Admitting: Dermatology

## 2022-08-30 ENCOUNTER — Encounter: Payer: Self-pay | Admitting: Dermatology

## 2022-08-30 DIAGNOSIS — C439 Malignant melanoma of skin, unspecified: Secondary | ICD-10-CM

## 2022-08-30 DIAGNOSIS — Z4802 Encounter for removal of sutures: Secondary | ICD-10-CM

## 2022-08-30 NOTE — Progress Notes (Signed)
   Follow-Up Visit   Subjective  Shelley Mahoney is a 89 y.o. female who presents for the following: Suture removal. Left upper arm.  Pathology showed no residual melanoma, margins free  The following portions of the chart were reviewed this encounter and updated as appropriate: medications, allergies, medical history  Review of Systems:  No other skin or systemic complaints except as noted in HPI or Assessment and Plan.  Objective  Well appearing patient in no apparent distress; mood and affect are within normal limits.  Areas Examined: Left upper arm   Relevant physical exam findings are noted in the Assessment and Plan.    Assessment & Plan    Encounter for Removal of Sutures - Incision site is clean, dry and intact. - Wound cleansed, sutures removed, wound cleansed and steri strips applied.  - Discussed pathology results showing no residual melanoma, margins free - Patient advised to keep steri-strips dry until they fall off. - Scars remodel for a full year. - Once steri-strips fall off, patient can apply over-the-counter silicone scar cream once to twice a day to help with scar remodeling if desired. - Patient advised to call with any concerns or if they notice any new or changing lesions.  Return in about 3 months (around 11/29/2022) for TBSE, HxMM.  I, Lawson Radar, CMA, am acting as scribe for Willeen Niece, MD.  Documentation: I have reviewed the above documentation for accuracy and completeness, and I agree with the above.  Willeen Niece MD

## 2022-08-30 NOTE — Telephone Encounter (Signed)
Discussed at follow up appt. aw

## 2022-08-30 NOTE — Patient Instructions (Signed)
Due to recent changes in healthcare laws, you may see results of your pathology and/or laboratory studies on MyChart before the doctors have had a chance to review them. We understand that in some cases there may be results that are confusing or concerning to you. Please understand that not all results are received at the same time and often the doctors may need to interpret multiple results in order to provide you with the best plan of care or course of treatment. Therefore, we ask that you please give us 2 business days to thoroughly review all your results before contacting the office for clarification. Should we see a critical lab result, you will be contacted sooner.   If You Need Anything After Your Visit  If you have any questions or concerns for your doctor, please call our main line at [phone] and press option 4 to reach your doctor's medical assistant. If no one answers, please leave a voicemail as directed and we will return your call as soon as possible. Messages left after 4 pm will be answered the following business day.   You may also send us a message via MyChart. We typically respond to MyChart messages within 1-2 business days.  For prescription refills, please ask your pharmacy to contact our office. Our fax number is [phone].  If you have an urgent issue when the clinic is closed that cannot wait until the next business day, you can page your doctor at the number below.    Please note that while we do our best to be available for urgent issues outside of office hours, we are not available 24/7.   If you have an urgent issue and are unable to reach us, you may choose to seek medical care at your doctor's office, retail clinic, urgent care center, or emergency room.  If you have a medical emergency, please immediately call 911 or go to the emergency department.  Pager Numbers  - Dr. Kowalski: [phone]  - Dr. Moye: [phone]  - Dr. Stewart:  [phone]  In the event of inclement weather, please call our main line at [phone] for an update on the status of any delays or closures.  Dermatology Medication Tips: Please keep the boxes that topical medications come in in order to help keep track of the instructions about where and how to use these. Pharmacies typically print the medication instructions only on the boxes and not directly on the medication tubes.   If your medication is too expensive, please contact our office at [phone] option 4 or send us a message through MyChart.   We are unable to tell what your co-pay for medications will be in advance as this is different depending on your insurance coverage. However, we may be able to find a substitute medication at lower cost or fill out paperwork to get insurance to cover a needed medication.   If a prior authorization is required to get your medication covered by your insurance company, please allow us 1-2 business days to complete this process.  Drug prices often vary depending on where the prescription is filled and some pharmacies may offer cheaper prices.  The website www.goodrx.com contains coupons for medications through different pharmacies. The prices here do not account for what the cost may be with help from insurance (it may be cheaper with your insurance), but the website can give you the price if you did not use any insurance.  - You can print the associated coupon and take it with   your prescription to the pharmacy.  - You may also stop by our office during regular business hours and pick up a GoodRx coupon card.  - If you need your prescription sent electronically to a different pharmacy, notify our office through [facility] MyChart or by phone at [phone] option 4.     Si Usted Necesita Algo Despus de Su Visita  Tambin puede enviarnos un mensaje a travs de MyChart. Por lo general respondemos a los mensajes de MyChart en el transcurso de 1 a 2  das hbiles.  Para renovar recetas, por favor pida a su farmacia que se ponga en contacto con nuestra oficina. Nuestro nmero de fax es el [phone].  Si tiene un asunto urgente cuando la clnica est cerrada y que no puede esperar hasta el siguiente da hbil, puede llamar/localizar a su doctor(a) al nmero que aparece a continuacin.   Por favor, tenga en cuenta que aunque hacemos todo lo posible para estar disponibles para asuntos urgentes fuera del horario de oficina, no estamos disponibles las 24 horas del da, los 7 das de la semana.   Si tiene un problema urgente y no puede comunicarse con nosotros, puede optar por buscar atencin mdica  en el consultorio de su doctor(a), en una clnica privada, en un centro de atencin urgente o en una sala de emergencias.  Si tiene una emergencia mdica, por favor llame inmediatamente al 911 o vaya a la sala de emergencias.  Nmeros de bper  - Dr. Kowalski: [phone]  - Dra. Moye: [phone]  - Dra. Stewart: [phone]  En caso de inclemencias del tiempo, por favor llame a nuestra lnea principal al [phone] para una actualizacin sobre el estado de cualquier retraso o cierre.  Consejos para la medicacin en dermatologa: Por favor, guarde las cajas en las que vienen los medicamentos de uso tpico para ayudarle a seguir las instrucciones sobre dnde y cmo usarlos. Las farmacias generalmente imprimen las instrucciones del medicamento slo en las cajas y no directamente en los tubos del medicamento.   Si su medicamento es muy caro, por favor, pngase en contacto con nuestra oficina llamando al [phone] y presione la opcin 4 o envenos un mensaje a travs de MyChart.   No podemos decirle cul ser su copago por los medicamentos por adelantado ya que esto es diferente dependiendo de la cobertura de su seguro. Sin embargo, es posible que podamos encontrar un medicamento sustituto a menor costo o llenar un formulario para que el  seguro cubra el medicamento que se considera necesario.   Si se requiere una autorizacin previa para que su compaa de seguros cubra su medicamento, por favor permtanos de 1 a 2 das hbiles para completar este proceso.  Los precios de los medicamentos varan con frecuencia dependiendo del lugar de dnde se surte la receta y alguna farmacias pueden ofrecer precios ms baratos.  El sitio web www.goodrx.com tiene cupones para medicamentos de diferentes farmacias. Los precios aqu no tienen en cuenta lo que podra costar con la ayuda del seguro (puede ser ms barato con su seguro), pero el sitio web puede darle el precio si no utiliz ningn seguro.  - Puede imprimir el cupn correspondiente y llevarlo con su receta a la farmacia.  - Tambin puede pasar por nuestra oficina durante el horario de atencin regular y recoger una tarjeta de cupones de GoodRx.  - Si necesita que su receta se enve electrnicamente a una farmacia diferente, informe a nuestra oficina a travs de MyChart de    o por telfono llamando al [phone] y presione la opcin 4.  

## 2022-11-08 ENCOUNTER — Other Ambulatory Visit: Payer: Self-pay | Admitting: Internal Medicine

## 2022-11-08 DIAGNOSIS — Z85118 Personal history of other malignant neoplasm of bronchus and lung: Secondary | ICD-10-CM

## 2022-11-16 ENCOUNTER — Ambulatory Visit
Admission: RE | Admit: 2022-11-16 | Discharge: 2022-11-16 | Disposition: A | Discharge status: Home / Self Care | Payer: Medicare PPO | Source: Ambulatory Visit | Attending: Internal Medicine | Admitting: Internal Medicine

## 2022-11-16 DIAGNOSIS — Z85118 Personal history of other malignant neoplasm of bronchus and lung: Secondary | ICD-10-CM | POA: Diagnosis present

## 2022-11-29 ENCOUNTER — Observation Stay
Admission: EM | Admit: 2022-11-29 | Discharge: 2022-12-01 | DRG: 390 | Disposition: A | Discharge status: Home / Self Care | Payer: Medicare PPO | Attending: Internal Medicine | Admitting: Internal Medicine

## 2022-11-29 ENCOUNTER — Other Ambulatory Visit: Payer: Self-pay

## 2022-11-29 ENCOUNTER — Encounter: Payer: Self-pay | Admitting: Internal Medicine

## 2022-11-29 ENCOUNTER — Emergency Department: Payer: Medicare PPO

## 2022-11-29 DIAGNOSIS — Z800 Family history of malignant neoplasm of digestive organs: Secondary | ICD-10-CM

## 2022-11-29 DIAGNOSIS — G250 Essential tremor: Secondary | ICD-10-CM | POA: Diagnosis present

## 2022-11-29 DIAGNOSIS — E039 Hypothyroidism, unspecified: Secondary | ICD-10-CM | POA: Diagnosis present

## 2022-11-29 DIAGNOSIS — N1831 Chronic kidney disease, stage 3a: Secondary | ICD-10-CM | POA: Diagnosis present

## 2022-11-29 DIAGNOSIS — R109 Unspecified abdominal pain: Secondary | ICD-10-CM | POA: Diagnosis present

## 2022-11-29 DIAGNOSIS — K5651 Intestinal adhesions [bands], with partial obstruction: Secondary | ICD-10-CM | POA: Diagnosis not present

## 2022-11-29 DIAGNOSIS — Z85118 Personal history of other malignant neoplasm of bronchus and lung: Secondary | ICD-10-CM | POA: Diagnosis not present

## 2022-11-29 DIAGNOSIS — K638219 Small intestinal bacterial overgrowth, unspecified: Secondary | ICD-10-CM | POA: Diagnosis present

## 2022-11-29 DIAGNOSIS — Z66 Do not resuscitate: Secondary | ICD-10-CM | POA: Diagnosis present

## 2022-11-29 DIAGNOSIS — K56609 Unspecified intestinal obstruction, unspecified as to partial versus complete obstruction: Secondary | ICD-10-CM | POA: Diagnosis present

## 2022-11-29 DIAGNOSIS — Z79899 Other long term (current) drug therapy: Secondary | ICD-10-CM | POA: Diagnosis not present

## 2022-11-29 DIAGNOSIS — N183 Chronic kidney disease, stage 3 unspecified: Secondary | ICD-10-CM | POA: Diagnosis not present

## 2022-11-29 DIAGNOSIS — Z923 Personal history of irradiation: Secondary | ICD-10-CM | POA: Diagnosis not present

## 2022-11-29 DIAGNOSIS — Z803 Family history of malignant neoplasm of breast: Secondary | ICD-10-CM | POA: Diagnosis not present

## 2022-11-29 DIAGNOSIS — Z8542 Personal history of malignant neoplasm of other parts of uterus: Secondary | ICD-10-CM

## 2022-11-29 DIAGNOSIS — D72829 Elevated white blood cell count, unspecified: Secondary | ICD-10-CM | POA: Diagnosis present

## 2022-11-29 DIAGNOSIS — K56600 Partial intestinal obstruction, unspecified as to cause: Secondary | ICD-10-CM | POA: Diagnosis not present

## 2022-11-29 DIAGNOSIS — Z9682 Presence of neurostimulator: Secondary | ICD-10-CM

## 2022-11-29 DIAGNOSIS — Z90710 Acquired absence of both cervix and uterus: Secondary | ICD-10-CM

## 2022-11-29 DIAGNOSIS — Z85820 Personal history of malignant melanoma of skin: Secondary | ICD-10-CM

## 2022-11-29 DIAGNOSIS — Z7982 Long term (current) use of aspirin: Secondary | ICD-10-CM | POA: Diagnosis not present

## 2022-11-29 DIAGNOSIS — R112 Nausea with vomiting, unspecified: Secondary | ICD-10-CM

## 2022-11-29 DIAGNOSIS — Z79890 Hormone replacement therapy (postmenopausal): Secondary | ICD-10-CM | POA: Diagnosis not present

## 2022-11-29 LAB — COMPREHENSIVE METABOLIC PANEL
ALT: 19 U/L (ref 0–44)
AST: 30 U/L (ref 15–41)
Albumin: 4.4 g/dL (ref 3.5–5.0)
Alkaline Phosphatase: 59 U/L (ref 38–126)
Anion gap: 12 (ref 5–15)
BUN: 39 mg/dL — ABNORMAL HIGH (ref 8–23)
CO2: 28 mmol/L (ref 22–32)
Calcium: 9.4 mg/dL (ref 8.9–10.3)
Chloride: 101 mmol/L (ref 98–111)
Creatinine, Ser: 1.12 mg/dL — ABNORMAL HIGH (ref 0.44–1.00)
GFR, Estimated: 47 mL/min — ABNORMAL LOW (ref >60)
Glucose, Bld: 144 mg/dL — ABNORMAL HIGH (ref 70–99)
Potassium: 4.5 mmol/L (ref 3.5–5.1)
Sodium: 141 mmol/L (ref 135–145)
Total Bilirubin: 0.5 mg/dL (ref 0.3–1.2)
Total Protein: 7.7 g/dL (ref 6.5–8.1)

## 2022-11-29 LAB — CBC WITH DIFFERENTIAL/PLATELET
Abs Immature Granulocytes: 0.05 10*3/uL (ref 0.00–0.07)
Basophils Absolute: 0.1 10*3/uL (ref 0.0–0.1)
Basophils Relative: 0 %
Eosinophils Absolute: 0.1 10*3/uL (ref 0.0–0.5)
Eosinophils Relative: 1 %
HCT: 45.7 % (ref 36.0–46.0)
Hemoglobin: 14.6 g/dL (ref 12.0–15.0)
Immature Granulocytes: 0 %
Lymphocytes Relative: 9 %
Lymphs Abs: 1.2 10*3/uL (ref 0.7–4.0)
MCH: 29.4 pg (ref 26.0–34.0)
MCHC: 31.9 g/dL (ref 30.0–36.0)
MCV: 92 fL (ref 80.0–100.0)
Monocytes Absolute: 1.3 10*3/uL — ABNORMAL HIGH (ref 0.1–1.0)
Monocytes Relative: 10 %
Neutro Abs: 10.2 10*3/uL — ABNORMAL HIGH (ref 1.7–7.7)
Neutrophils Relative %: 80 %
Platelets: 222 10*3/uL (ref 150–400)
RBC: 4.97 MIL/uL (ref 3.87–5.11)
RDW: 13.3 % (ref 11.5–15.5)
WBC: 13 10*3/uL — ABNORMAL HIGH (ref 4.0–10.5)
nRBC: 0 % (ref 0.0–0.2)

## 2022-11-29 MED ORDER — ONDANSETRON HCL 4 MG/2ML IJ SOLN: 4.0000 mg | Freq: Four times a day (QID) | INTRAMUSCULAR | Status: DC | PRN

## 2022-11-29 MED ORDER — PRIMIDONE 50 MG PO TABS
200.0000 mg | ORAL_TABLET | Freq: Every day | ORAL | Status: DC
  Administered 2022-11-29 – 2022-11-30 (×2): 200 mg via ORAL
  Filled 2022-11-29 (×2): qty 4

## 2022-11-29 MED ORDER — ASPIRIN 81 MG PO TBEC
81.0000 mg | DELAYED_RELEASE_TABLET | Freq: Every day | ORAL | Status: DC
  Administered 2022-11-30 – 2022-12-01 (×2): 81 mg via ORAL
  Filled 2022-11-29 (×2): qty 1

## 2022-11-29 MED ORDER — SIMVASTATIN 20 MG PO TABS
40.0000 mg | ORAL_TABLET | Freq: Every day | ORAL | Status: DC
  Administered 2022-11-29 – 2022-11-30 (×2): 40 mg via ORAL
  Filled 2022-11-29 (×2): qty 2

## 2022-11-29 MED ORDER — PRAMIPEXOLE DIHYDROCHLORIDE 0.25 MG PO TABS
0.3750 mg | ORAL_TABLET | Freq: Every day | ORAL | Status: DC
  Administered 2022-11-29 – 2022-11-30 (×2): 0.375 mg via ORAL
  Filled 2022-11-29 (×2): qty 2

## 2022-11-29 MED ORDER — ONDANSETRON HCL 4 MG PO TABS: 4.0000 mg | ORAL_TABLET | Freq: Four times a day (QID) | ORAL | Status: DC | PRN

## 2022-11-29 MED ORDER — SODIUM CHLORIDE 0.9 % IV SOLN: INTRAVENOUS | Status: DC

## 2022-11-29 MED ORDER — LEVOTHYROXINE SODIUM 88 MCG PO TABS
88.0000 ug | ORAL_TABLET | Freq: Every day | ORAL | Status: DC
  Administered 2022-11-30 – 2022-12-01 (×2): 88 ug via ORAL
  Filled 2022-11-29 (×2): qty 1

## 2022-11-29 MED ORDER — PRAMIPEXOLE DIHYDROCHLORIDE 0.25 MG PO TABS: 0.2500 mg | ORAL_TABLET | Freq: Every day | ORAL | Status: DC

## 2022-11-29 MED ORDER — IOHEXOL 300 MG/ML  SOLN
80.0000 mL | Freq: Once | INTRAMUSCULAR | Status: AC | PRN
  Administered 2022-11-29: 80 mL via INTRAVENOUS

## 2022-11-29 MED ORDER — ENOXAPARIN SODIUM 40 MG/0.4ML IJ SOSY
40.0000 mg | PREFILLED_SYRINGE | INTRAMUSCULAR | Status: DC
  Administered 2022-11-29 – 2022-12-01 (×3): 40 mg via SUBCUTANEOUS
  Filled 2022-11-29 (×3): qty 0.4

## 2022-11-29 MED ORDER — SODIUM CHLORIDE 0.9 % IV BOLUS
500.0000 mL | Freq: Once | INTRAVENOUS | Status: AC
  Administered 2022-11-29: 500 mL via INTRAVENOUS

## 2022-11-29 MED ORDER — SODIUM CHLORIDE 0.9 % IV SOLN: Freq: Once | INTRAVENOUS | Status: AC

## 2022-11-29 NOTE — ED Triage Notes (Signed)
Pt BIB [facility] from home for emesis starting @ 1900. EMS reports "it looks and smells like a GI bleed. Pt is not on thinners, received 4 Zofran PTA. Pt brought DNR form with her.

## 2022-11-29 NOTE — Progress Notes (Signed)
Mobility Specialist - Progress Note   11/29/22 1209  Mobility  Activity Ambulated with assistance in room;Ambulated with assistance to bathroom  Level of Assistance Standby assist, set-up cues, supervision of patient - no hands on  Assistive Device Other (Comment) (walking stick)  Distance Ambulated (ft) 28 ft  Activity Response Tolerated well  $Mobility charge 1 Mobility  Mobility Specialist Start Time (ACUTE ONLY) 1154  Mobility Specialist Stop Time (ACUTE ONLY) 1208  Mobility Specialist Time Calculation (min) (ACUTE ONLY) 14 min   MS responding to bed alarm, Pt requesting to use the bathroom. Pt completed bed mob and STS to walking stick SBA. Pt amb to/from the bathroom with supervision, tolerated well. Pt left supine with alarm set and needs within reach. RN notified.   Zetta Bills Mobility Specialist 11/29/22 12:14 PM

## 2022-11-29 NOTE — Consult Note (Signed)
[facility] SURGICAL CONSULTATION NOTE (initial) - cpt: 16109   HISTORY OF PRESENT ILLNESS (HPI):  89 y.o. female presented to [facility] ED yesterday for evaluation of abdominal pain. Patient reports the acute onset of sharp, generalized, abdominal pain last night after dinner. This was accompanied with nausea and an episode of emesis. Initially the emesis improved her pain but the symptoms returned shortly after. No fever, chills, cough, CP, SOB, urinary changes. She did have a BM yesterday. She does have a history of similar a year ago, but denied any previous bowel obstruction diagnosis. Abdominal surgeries positive for abdominal hysterectomy. Work up in the ED revealed a mild leukocytosis to 13.0K, Hgb to 14.6, sCr- 1.12 but this seems to be near baseline. CT Abdomen/Pelvis was preformed and concerning for dilated loops of small bowel without obvious transition zone, question of fecalization of jejunum concerning for SIBO as well. At the time of admission, her symptoms were improving and she no longer had nausea/emesis.   This morning, she reports feeling much better. She denied any abdominal pain, nausea, emesis. She has not passed gas yet. She is hungry. No other new complaints or changes.    Surgery is consulted by emergency medicine physician Dr. Loleta Rose, MD in this context for evaluation and management of pSBO.  PAST MEDICAL HISTORY (PMH):  Past Medical History:  Diagnosis Date   Carotid stenosis    Carotid stenosis    Chronic kidney disease    Stage 3, GFR30-59 ml-min   CKD (chronic kidney disease)    Dysplastic nevus 09/22/2019   Right upper forehead. Severe atypia.   Hypothyroidism    Lung cancer (HCC) 2018   Melanoma (HCC) 08/08/2022   L post upper arm, B[place] 0.4 mm, level III, WLE 08/23/22 [place], [place] testing Class 1A   Neuro-degenerative disorders (HCC)    Personal history of radiation therapy    Uterine cancer (HCC)    ovarian     PAST  SURGICAL HISTORY (PSH):  Past Surgical History:  Procedure Laterality Date   ABDOMINAL HYSTERECTOMY     Total-does not have ovaries   COLONOSCOPY WITH PROPOFOL N/A 12/08/2014   Procedure: COLONOSCOPY WITH PROPOFOL;  Surgeon: Christena Deem, MD;  Location: [facility] ENDOSCOPY;  Service: Endoscopy;  Laterality: N/A;   EYE SURGERY     cataracts ou   FLEXIBLE BRONCHOSCOPY N/A 04/21/2016   Procedure: FLEXIBLE BRONCHOSCOPY;  Surgeon: Merwyn Katos, MD;  Location: [facility] ORS;  Service: Pulmonary;  Laterality: N/A;   THYROID SURGERY       MEDICATIONS:  Prior to Admission medications   Medication Sig Start Date End Date Taking? Authorizing Provider  ASPIRIN 81 PO Take 81 mg by mouth daily. 11/11/09   [provider]  cholecalciferol (VITAMIN D) 1000 units tablet Take 1,000 Units by mouth daily.    [provider]  gabapentin (NEURONTIN) 100 MG capsule Take 100 mg by mouth 2 (two) times daily. Two at noon, one at night    [provider]  gabapentin (NEURONTIN) 400 MG capsule Take 400 mg by mouth 2 (two) times daily. One at evening meal and one at bedtime    [provider]  IRON CR PO Take 45 mg by mouth daily.    [provider]  levothyroxine (SYNTHROID) 88 MCG tablet Take 1 tablet (88 mcg total) by mouth once daily Monday-Saturday. Take HALF tablet (44 mcg total) on Sundays. Take on an empty stomach with a glass of water. 01/12/22   [provider]  MAGNESIUM GLYCINATE PLUS PO Take 400 mg by mouth 2 (two) times daily.     [provider]  Multiple Vitamins-Minerals (PRESERVISION AREDS) CAPS Take by mouth.    [provider]  mupirocin ointment (BACTROBAN) 2 % Apply daily with bandage change. 08/23/22   Willeen Niece, MD  pramipexole (MIRAPEX) 0.125 MG tablet Take 0.25 mg by mouth at bedtime.     [provider]  primidone (MYSOLINE) 50 MG tablet Take 50 mg by mouth at bedtime. Three at night    [provider]   Probiotic Product (PHILLIPS COLON HEALTH PO) Take 1 tablet by mouth daily.     [provider]  simvastatin (ZOCOR) 40 MG tablet Take 40 mg by mouth at bedtime.     [provider]     ALLERGIES:  No Known Allergies   SOCIAL HISTORY:  Social History   Socioeconomic History   Marital status: Single    Spouse name: Not on file   Number of children: Not on file   Years of education: Not on file   Highest education level: Not on file  Occupational History   Not on file  Tobacco Use   Smoking status: Never   Smokeless tobacco: Never  Vaping Use   Vaping Use: Not on file  Substance and Sexual Activity   Alcohol use: No   Drug use: No   Sexual activity: Not on file  Other Topics Concern   Not on file  Social History Narrative   Not on file   Social Determinants of Health   Financial Resource Strain: Not on file  Food Insecurity: No Food Insecurity (11/29/2022)   Hunger Vital Sign    Worried About Running Out of Food in the Last Year: Never true    Ran Out of Food in the Last Year: Never true  Transportation Needs: No Transportation Needs (11/29/2022)   PRAPARE - Administrator, Civil Service (Medical): No    Lack of Transportation (Non-Medical): No  Physical Activity: Not on file  Stress: Not on file  Social Connections: Not on file  Intimate Partner Violence: Not At Risk (11/29/2022)   Humiliation, Afraid, Rape, and Kick questionnaire    Fear of Current or Ex-Partner: No    Emotionally Abused: No    Physically Abused: No    Sexually Abused: No     FAMILY HISTORY:  Family History  Problem Relation Age of Onset   Breast cancer Mother    Other Father        erythrocytosis    Colon cancer Maternal Grandmother       REVIEW OF SYSTEMS:  Review of Systems  Constitutional:  Negative for chills and fever.  Respiratory:  Negative for cough and shortness of breath.   Cardiovascular:  Negative for chest pain and palpitations.   Gastrointestinal:  Positive for abdominal pain (Resolved), nausea (Resolved) and vomiting (Resolved). Negative for constipation and diarrhea.  Genitourinary:  Negative for dysuria and urgency.  All other systems reviewed and are negative.   VITAL SIGNS:  Temp:  [98.2 F (36.8 C)] 98.2 F (36.8 C) (07/10 0235) Pulse Rate:  [82-97] 84 (07/10 0700) Resp:  [12-27] 12 (07/10 0700) BP: (119-146)/(58-96) 122/64 (07/10 0630) SpO2:  [92 %-99 %] 94 % (07/10 0700) Weight:  [86 kg] 86 kg (07/10 0236)     Height: 5\' 7"  (170.2 cm) Weight: 86 kg BMI (Calculated): 29.7   INTAKE/OUTPUT:  No intake/output data recorded.  PHYSICAL EXAM:  Physical Exam Vitals and nursing note reviewed. Exam conducted with a chaperone present.  Constitutional:      General: She is not in acute distress.    Appearance: She is well-developed. She is not ill-appearing.     Comments: Patient resting in bed; NAD  HENT:     Head: Normocephalic and atraumatic.  Eyes:     General: No scleral icterus.    Extraocular Movements: Extraocular movements intact.  Cardiovascular:     Rate and Rhythm: Normal rate.     Heart sounds: Normal heart sounds.  Pulmonary:     Effort: Pulmonary effort is normal. No respiratory distress.  Abdominal:     General: A surgical scar is present. There is no distension.     Palpations: Abdomen is soft.     Tenderness: There is no abdominal tenderness. There is no guarding or rebound.     Comments: Abdomen is soft, she is non-tender, non distended but some tympany worse in upper abdomen, no rebound/guarding. She is certainly not peritonitic.   Genitourinary:    Comments: Deferred Skin:    General: Skin is warm and dry.  Neurological:     General: No focal deficit present.     Mental Status: She is alert and oriented to person, place, and time.  Psychiatric:        Mood and Affect: Mood normal.        Behavior: Behavior normal.      Labs:     Latest Ref Rng & Units 11/29/2022     2:31 AM 04/09/2021   10:49 PM 10/28/2019    7:39 AM  CBC  WBC 4.0 - 10.5 K/uL 13.0  6.3  5.7   Hemoglobin 12.0 - 15.0 g/dL 16.1  09.6  04.5   Hematocrit 36.0 - 46.0 % 45.7  42.7  40.9   Platelets 150 - 400 K/uL 222  170  169       Latest Ref Rng & Units 11/29/2022    2:31 AM 02/01/2022    3:43 PM 05/30/2021   11:26 AM  CMP  Glucose 70 - 99 mg/dL 409  91    BUN 8 - 23 mg/dL 39  23    Creatinine 8.11 - 1.00 mg/dL 9.14  7.82  9.56   Sodium 135 - 145 mmol/L 141  138    Potassium 3.5 - 5.1 mmol/L 4.5  4.7    Chloride 98 - 111 mmol/L 101  102    CO2 22 - 32 mmol/L 28     Calcium 8.9 - 10.3 mg/dL 9.4  8.9    Total Protein 6.5 - 8.1 g/dL 7.7  7.4    Total Bilirubin 0.3 - 1.2 mg/dL 0.5  0.3    Alkaline Phos 38 - 126 U/L 59  74    AST 15 - 41 U/L 30  21    ALT 0 - 44 U/L 19        Imaging studies:   CT Abdomen/Pelvis (11/29/2022) personally reviewed showing small bowel dilation without obvious transition, fecalization changes noted in jejunum, no free air, no pneumatosis, and radiologist report reviewed below:  IMPRESSION: 1. Dilated loops of small bowel, with an overall appearance suggestive of early or partial small bowel obstruction. There also appears to be probable small intestinal bacterial overgrowth (SIBO), as above. 2. Small volume of ascites. 3. Aortic atherosclerosis. 4. Additional postoperative changes and incidental findings, as above.   Assessment/Plan: (ICD-10's: K109.609) 89 y.o. 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   - Clinically, she is doing much better this morning with resolution in her symptoms and abdominal examination is benign. For now, I think it is reasonable to trial CLD - Given lack of nausea/emesis as well as stomach decompression on imaging, I do not think there is role for NGT at this time   - No emergent surgical intervention - Monitor abdominal examination; on-going bowel function -  Morning KUB - Pain control prn; antiemetics prn - Monitor leukocytosis    - Mobilize as tolerated  - Further management per primary service; we will follow   All of the above findings and recommendations were discussed with the patient, and all of patient's questions were answered to her expressed satisfaction.  Thank you for the opportunity to participate in this patient's care.   -- Lynden Oxford, PA-C [facility] 11/29/2022, 7:15 AM M-F: 7am - 4pm

## 2022-11-29 NOTE — ED Provider Notes (Signed)
[facility] Provider Note    Event Date/Time   First MD Initiated Contact with Patient 11/29/22 [phone]     (approximate)   History   Abdominal Pain and Emesis   HPI Shelley Mahoney is a 89 y.o. female who presents by EMS for evaluation of abdominal pain, nausea, and vomiting.  She comes with DNR paperwork and confirmed that DNR is her wish.  She reports that she developed acute onset sharp nonlocalized abdominal pain a few hours ago.  It went away but then it came back stronger.  She had some associated nausea and vomiting but she denies diarrhea.  She has not felt ill recently before this.  She denies fever, chest pain, shortness of breath.  No recent dysuria.  She currently is experiencing no abdominal pain nor nausea.     Physical Exam   Triage Vital Signs: ED Triage Vitals  Enc Vitals Group     BP 11/29/22 0235 133/77     Pulse Rate 11/29/22 0235 82     Resp 11/29/22 0235 19     Temp 11/29/22 0235 98.2 F (36.8 C)     Temp Source 11/29/22 0235 Oral     SpO2 11/29/22 0230 98 %     Weight 11/29/22 0236 86 kg (189 lb 11.2 oz)     Height 11/29/22 0236 1.702 m (5\' 7" )     Head Circumference --      Peak Flow --      Pain Score 11/29/22 0236 0     Pain Loc --      Pain Edu? --      Excl. in GC? --     Most recent vital signs: Vitals:   11/29/22 0615 11/29/22 0630  BP:    Pulse: 89 86  Resp: 20 14  Temp:    SpO2: 92% 96%    General: Awake, no distress.  CV:  Good peripheral perfusion.  Regular rate and rhythm, normal heart sounds. Resp:  Normal effort. Speaking easily and comfortably, no accessory muscle usage nor intercostal retractions.  Lungs are clear to auscultation bilaterally. Abd:  No distention.  Some left, mildly tender to palpation throughout the abdomen but more profound in the periumbilical region.  No palpable deformity. Other:  Alert and oriented, normal mood and affect.  Rectal exam notable for light brown stool, Hemoccult  negative.  Nurse present for chaperone.   ED Results / Procedures / Treatments   Labs (all labs ordered are listed, but only abnormal results are displayed) Labs Reviewed  CBC WITH DIFFERENTIAL/PLATELET - Abnormal; Notable for the following components:      Result Value   WBC 13.0 (*)    Neutro Abs 10.2 (*)    Monocytes Absolute 1.3 (*)    All other components within normal limits  COMPREHENSIVE METABOLIC PANEL - Abnormal; Notable for the following components:   Glucose, Bld 144 (*)    BUN 39 (*)    Creatinine, Ser 1.12 (*)    GFR, Estimated 47 (*)    All other components within normal limits     EKG  ED ECG REPORT I, Loleta Rose, the attending physician, personally viewed and interpreted this ECG.  Date: 11/29/2022 EKG Time: 2:39 AM Rate: 87 Rhythm: Accelerated junctional rhythm QRS Axis: normal Intervals: normal ST/T Wave abnormalities: Non-specific ST segment / T-wave changes, but no clear evidence of acute ischemia. Narrative Interpretation: no definitive evidence of acute ischemia; does not meet STEMI criteria.  Artifact is present due to a patient's stimulator device.    RADIOLOGY I viewed and interpreted the patient's CT of the abdomen and pelvis.  See hospital course for details.   PROCEDURES:  Critical Care performed: No  .1-3 Lead EKG Interpretation  Performed by: Loleta Rose, MD Authorized by: Loleta Rose, MD     Interpretation: normal     ECG rate:  85   ECG rate assessment: normal     Rhythm: sinus rhythm     Ectopy: none     Conduction: normal       IMPRESSION / MDM / ASSESSMENT AND PLAN / ED COURSE  I reviewed the triage vital signs and the nursing notes.                              Differential diagnosis includes, but is not limited to, volvulus, SBO/ileus, infectious gastritis, metabolic or electrolyte abnormality, biliary colic/cholecystitis, mesenteric ischemia, atypical ACS presentation.  Patient's presentation is most  consistent with acute presentation with potential threat to life or bodily function.  Labs/studies ordered: CT of the abdomen and pelvis, CBC with differential, CMP  Interventions/Medications given:  Medications  sodium chloride 0.9 % bolus 500 mL (0 mLs Intravenous Stopped 11/29/22 0612)  iohexol (OMNIPAQUE) 300 MG/ML solution 80 mL (80 mLs Intravenous Contrast Given 11/29/22 0412)  0.9 %  sodium chloride infusion ( Intravenous New Bag/Given 11/29/22 0631)    (Note:  hospital course my include additional interventions and/or labs/studies not listed above.)   Patient is having no pain right now but she is 89 years old with extensive chronic medical issues with abdominal pain that caused her to call EMS.  Vital signs are currently stable but patient has tenderness to palpation throughout the abdomen, seemingly localized at the umbilicus.  I will order a CT of the abdomen pelvis once her lab work confirms normal renal function.  Doubt bowel obstruction  The patient is on the cardiac monitor to evaluate for evidence of arrhythmia and/or significant heart rate changes.   Clinical Course as of 11/29/22 0638  Wed Nov 29, 2022  0353 Mild leukocytosis and a slight elevation of BUN and creatinine.  I ordered a normal saline IV bolus of 500 mL and ordered CT scan of the abdomen and pelvis to further evaluate the patient's abdominal pain. [CF]  0604 I viewed and interpreted the patient's CT of the abdomen and pelvis and it is concerning for possible SBO.  Radiologist read it as early or partial SBO.  The radiologist also identified an area in the jejunum that appears to contain feculent material and question the possibility of small intestinal bacterial overgrowth (SIBO).  I consulted by phone with Dr. Everlene Farrier with surgery.  I suggested that given no definitive diagnosis, we may not want to start rifaximin for the SIBO, and should instead see how the patient does clinically and/or determine if the  inpatient team wants to proceed with a breath test for example to give a specific diagnosis.  He agreed and recommended against empiric antibiotics in favor of seeing how the patient does.  Given that she is not actively vomiting, I will not place an NG tube.  On reassessment her physical exam is generally reassuring with improved tenderness to palpation from prior.  I explained to the patient the need to stay n.p.o. and to stay in the hospital and she agrees.  I will consult the hospitalist team for admission  as per Dr. Hurman Horn agreement and recommendations. [CF]  [phone] Consulted with Dr. Para March with the hospitalist service who will put in admission orders and the patient will likely be seen by the daytime admitting physician. [CF]    Clinical Course User Index [CF] Loleta Rose, MD     FINAL CLINICAL IMPRESSION(S) / ED DIAGNOSES   Final diagnoses:  Partial small bowel obstruction (HCC)  Nausea and vomiting, unspecified vomiting type  Small intestinal bacterial overgrowth, unspecified     Rx / DC Orders   ED Discharge Orders     None        Note:  This document was prepared using Dragon voice recognition software and may include unintentional dictation errors.   Loleta Rose, MD 11/29/22 [phone]

## 2022-11-29 NOTE — Progress Notes (Signed)
ED placed DNR bracelet on pt. DNR bracelet present and in place upon pt arrival to unit.

## 2022-11-29 NOTE — H&P (Addendum)
History and Physical    Patient: Shelley Mahoney:[id number] DOB: Aug 25, 1931 DOA: 11/29/2022 DOS: the patient was seen and examined on 11/29/2022 PCP: Lauro Regulus, MD  Patient coming from: Home  Chief Complaint:  Chief Complaint  Patient presents with   Abdominal Pain   Emesis   HPI: Shelley Mahoney is a 89 y.o. female with medical history significant of stage III CKD, hypothyroidism, neurodegenerative disorder status post deep brain stimulator presenting with small bowel obstruction and SIBO.  Patient reports progressive onset of generalized abdominal pain since last night.  Was unable to sleep secondary to pain.  Positive nausea and vomiting associated with symptoms.  No diarrhea.  Patient reports remote episodes like this in the past but nothing recent.  Patient denies any prior abdominal surgeries though is noted had an abdominal hysterectomy in the past.  No reported change in diet.  No fevers or chills.  No chest pain or shortness of breath.  Symptoms have progressively worsened over the course of the morning.  Patient currently lives at home. Presented to the ER afebrile, hemodynamically stable.  White count 13, hemoglobin 14.6, platelets 222, creatinine 1.12.  CT abdomen pelvis with dilated bowel loops of small bowel concerning for early partial small bowel obstruction as well as?  SIBO. Review of Systems: As mentioned in the history of present illness. All other systems reviewed and are negative. Past Medical History:  Diagnosis Date   Carotid stenosis    Carotid stenosis    Chronic kidney disease    Stage 3, GFR30-59 ml-min   CKD (chronic kidney disease)    Dysplastic nevus 09/22/2019   Right upper forehead. Severe atypia.   Hypothyroidism    Lung cancer (HCC) 2018   Melanoma (HCC) 08/08/2022   L post upper arm, [place] 0.4 mm, level III, WLE 08/23/22 [place], [place] testing Class 1A   Neuro-degenerative disorders (HCC)    Personal history of radiation  therapy    Uterine cancer (HCC)    ovarian   Past Surgical History:  Procedure Laterality Date   ABDOMINAL HYSTERECTOMY     Total-does not have ovaries   COLONOSCOPY WITH PROPOFOL N/A 12/08/2014   Procedure: COLONOSCOPY WITH PROPOFOL;  Surgeon: Christena Deem, MD;  Location: [facility] ENDOSCOPY;  Service: Endoscopy;  Laterality: N/A;   EYE SURGERY     cataracts ou   FLEXIBLE BRONCHOSCOPY N/A 04/21/2016   Procedure: FLEXIBLE BRONCHOSCOPY;  Surgeon: Merwyn Katos, MD;  Location: [facility] ORS;  Service: Pulmonary;  Laterality: N/A;   THYROID SURGERY     Social History:  reports that she has never smoked. She has never used smokeless tobacco. She reports that she does not drink alcohol and does not use drugs.  No Known Allergies  Family History  Problem Relation Age of Onset   Breast cancer Mother    Other Father        erythrocytosis    Colon cancer Maternal Grandmother     Prior to Admission medications   Medication Sig Start Date End Date Taking? Authorizing Provider  ASPIRIN 81 PO Take 81 mg by mouth daily. 11/11/09   [provider]  cholecalciferol (VITAMIN D) 1000 units tablet Take 1,000 Units by mouth daily.    [provider]  gabapentin (NEURONTIN) 100 MG capsule Take 100 mg by mouth 2 (two) times daily. Two at noon, one at night    [provider]  gabapentin (NEURONTIN) 400 MG capsule Take 400 mg by mouth 2 (two)  times daily. One at evening meal and one at bedtime    [provider]  IRON CR PO Take 45 mg by mouth daily.    [provider]  levothyroxine (SYNTHROID) 88 MCG tablet Take 1 tablet (88 mcg total) by mouth once daily Monday-Saturday. Take HALF tablet (44 mcg total) on Sundays. Take on an empty stomach with a glass of water. 01/12/22   [provider]  MAGNESIUM GLYCINATE PLUS PO Take 400 mg by mouth 2 (two) times daily.     [provider]  Multiple Vitamins-Minerals (PRESERVISION AREDS) CAPS Take by mouth.     [provider]  mupirocin ointment (BACTROBAN) 2 % Apply daily with bandage change. 08/23/22   Willeen Niece, MD  pramipexole (MIRAPEX) 0.125 MG tablet Take 0.25 mg by mouth at bedtime.     [provider]  primidone (MYSOLINE) 50 MG tablet Take 50 mg by mouth at bedtime. Three at night    [provider]  Probiotic Product (PHILLIPS COLON HEALTH PO) Take 1 tablet by mouth daily.     [provider]  simvastatin (ZOCOR) 40 MG tablet Take 40 mg by mouth at bedtime.     [provider]    Physical Exam: Vitals:   11/29/22 0615 11/29/22 0630 11/29/22 0645 11/29/22 0700  BP:  122/64    Pulse: 89 86 88 84  Resp: 20 14 16 12   Temp:      TempSrc:      SpO2: 92% 96% 92% 94%  Weight:      Height:       Physical Exam Constitutional:      Appearance: She is normal weight.  HENT:     Head: Normocephalic.     Mouth/Throat:     Mouth: Mucous membranes are moist.  Eyes:     Pupils: Pupils are equal, round, and reactive to light.  Cardiovascular:     Rate and Rhythm: Normal rate and regular rhythm.  Pulmonary:     Effort: Pulmonary effort is normal.  Abdominal:     Comments: Mild generalized abdominal pain Decreased bowel sounds  Musculoskeletal:        General: Normal range of motion.     Cervical back: Normal range of motion.  Skin:    General: Skin is warm.  Neurological:     General: No focal deficit present.  Psychiatric:        Mood and Affect: Mood normal.     Data Reviewed:  There are no new results to review at this time. CT ABDOMEN PELVIS W CONTRAST CLINICAL DATA:  89 year old female with history of acute onset of nonlocalized abdominal pain. Emesis. History of lung cancer. * Tracking Code: BO *  EXAM: CT ABDOMEN AND PELVIS WITH CONTRAST  TECHNIQUE: Multidetector CT imaging of the abdomen and pelvis was performed using the standard protocol following bolus administration of intravenous contrast.  RADIATION DOSE  REDUCTION: This exam was performed according to the departmental dose-optimization program which includes automated exposure control, adjustment of the mA and/or kV according to patient size and/or use of iterative reconstruction technique.  CONTRAST:  80mL OMNIPAQUE IOHEXOL 300 MG/ML  SOLN  COMPARISON:  No priors.  FINDINGS: Lower chest: Unremarkable.  Hepatobiliary: There are 2 small subcentimeter low-attenuation lesions in the liver, too small to characterize, but statistically likely to represent cysts or biliary hamartomas (no imaging follow-up recommended). No other aggressive appearing hepatic lesions are noted. No intra or extrahepatic biliary ductal dilatation. Gallbladder is  unremarkable in appearance.  Pancreas: No pancreatic mass. No pancreatic ductal dilatation. No pancreatic or peripancreatic fluid collections or inflammatory changes.  Spleen: Unremarkable.  Adrenals/Urinary Tract: Multiple tiny subcentimeter low-attenuation lesions in both kidneys, too small to definitively characterize, but statistically likely tiny cysts (no imaging follow-up recommended). No hydroureteronephrosis. Urinary bladder is normal in appearance. Bilateral adrenal glands are normal in appearance.  Stomach/Bowel: The appearance of the stomach is normal. Multiple dilated loops of mid to distal small bowel are noted, measuring up to 3.9 cm in diameter. Several small air-fluid levels are noted. In addition, in the region of the jejunum there is feculent material in the small bowel, suggesting small intestine bacterial overgrowth. Distal small bowel is normal in caliber. No discrete transition point confidently identified. Liquid stool, gas and some solid stool are noted throughout the colon and rectum. Normal appendix.  Vascular/Lymphatic: Atherosclerosis in the abdominal aorta and pelvic vasculature, without evidence of aneurysm or dissection. No lymphadenopathy noted in the abdomen or  pelvis. Numerous surgical clips are noted along the pelvic sidewall bilaterally, presumably from prior lymph node dissection.  Reproductive: Status post hysterectomy. Ovaries are not confidently identified may be surgically absent or atrophic.  Other: Small volume of ascites.  No pneumoperitoneum.  Musculoskeletal: There are no aggressive appearing lytic or blastic lesions noted in the visualized portions of the skeleton.  IMPRESSION: 1. Dilated loops of small bowel, with an overall appearance suggestive of early or partial small bowel obstruction. There also appears to be probable small intestinal bacterial overgrowth (SIBO), as above. 2. Small volume of ascites. 3. Aortic atherosclerosis. 4. Additional postoperative changes and incidental findings, as above.  Electronically Signed   By: Trudie Reed M.D.   On: 11/29/2022 05:14  Lab Results  Component Value Date   WBC 13.0 (H) 11/29/2022   HGB 14.6 11/29/2022   HCT 45.7 11/29/2022   MCV 92.0 11/29/2022   PLT 222 11/29/2022   Last metabolic panel Lab Results  Component Value Date   GLUCOSE 144 (H) 11/29/2022   NA 141 11/29/2022   K 4.5 11/29/2022   CL 101 11/29/2022   CO2 28 11/29/2022   BUN 39 (H) 11/29/2022   CREATININE 1.12 (H) 11/29/2022   GFRNONAA 47 (L) 11/29/2022   CALCIUM 9.4 11/29/2022   PROT 7.7 11/29/2022   ALBUMIN 4.4 11/29/2022   LABGLOB 3.0 02/01/2022   AGRATIO 1.5 02/01/2022   BILITOT 0.5 11/29/2022   ALKPHOS 59 11/29/2022   AST 30 11/29/2022   ALT 19 11/29/2022   ANIONGAP 12 11/29/2022    Assessment and Plan:  1-SBO - Noted acute abdominal pain, nausea and vomiting imaging concern for small bowel obstruction versus SIBO - Bowel rest - Pain control  - Antiemetics - Dr. Everlene Farrier with general surgery consulted - Follow-up formal recommendations  2-SIBO -?  SIBO findings on CT scan today - Case preliminary discussed with general surgery - Defer treatment for now - Follow-up general  surgery recommendations  3-stage III CKD - Creatinine 1.12 today with GFR in the 40s - Appears near baseline -Monitor for now  4- Hypothyroidism - Stable - Restart Synthroid as SBO resolves  5- Essential Tremor -Followed by [facility] neurology - Status post deep brain stimulator  Greater than 50% was spent in counseling and coordination of care with patient Total encounter time 80 minutes or more    Advance Care Planning:   Code Status: DNR   Consults: Pending general surgery evaluation   Family Communication: No family at the bedside  Severity of Illness: The appropriate patient status for this patient is INPATIENT. Inpatient status is judged to be reasonable and necessary in order to provide the required intensity of service to ensure the patient's safety. The patient's presenting symptoms, physical exam findings, and initial radiographic and laboratory data in the context of their chronic comorbidities is felt to place them at high risk for further clinical deterioration. Furthermore, it is not anticipated that the patient will be medically stable for discharge from the hospital within 2 midnights of admission.   * I certify that at the point of admission it is my clinical judgment that the patient will require inpatient hospital care spanning beyond 2 midnights from the point of admission due to high intensity of service, high risk for further deterioration and high frequency of surveillance required.*  Author: Floydene Flock, MD 11/29/2022 7:54 AM  For on call review [email or web address].

## 2022-11-29 NOTE — Progress Notes (Signed)
Pt tolerating clear liquid diet.

## 2022-11-29 NOTE — Progress Notes (Signed)
Mobility Specialist - Progress Note     11/29/22 1556  Mobility  Activity Ambulated with assistance in hallway  Level of Assistance Standby assist, set-up cues, supervision of patient - no hands on  Assistive Device Cane  Distance Ambulated (ft) 340 ft  Range of Motion/Exercises Active  Activity Response Tolerated well  Mobility Referral Yes  $Mobility charge 1 Mobility  Mobility Specialist Start Time (ACUTE ONLY) 1537  Mobility Specialist Stop Time (ACUTE ONLY) 1556  Mobility Specialist Time Calculation (min) (ACUTE ONLY) 19 min   Pt resting in bed on RA upon entry. Pt STS and ambulates to hallway around NS for 2 laps with cane SBA. Pt returned to bed and left with needs in reach; bed alarm activated.   Johnathan Hausen Mobility Specialist 11/29/22, 3:58 PM

## 2022-11-29 NOTE — Plan of Care (Signed)

## 2022-11-30 ENCOUNTER — Inpatient Hospital Stay: Payer: Medicare PPO

## 2022-11-30 DIAGNOSIS — K56600 Partial intestinal obstruction, unspecified as to cause: Secondary | ICD-10-CM | POA: Diagnosis not present

## 2022-11-30 LAB — COMPREHENSIVE METABOLIC PANEL
ALT: 15 U/L (ref 0–44)
AST: 19 U/L (ref 15–41)
Albumin: 3.3 g/dL — ABNORMAL LOW (ref 3.5–5.0)
Alkaline Phosphatase: 42 U/L (ref 38–126)
Anion gap: 6 (ref 5–15)
BUN: 19 mg/dL (ref 8–23)
CO2: 24 mmol/L (ref 22–32)
Calcium: 7.7 mg/dL — ABNORMAL LOW (ref 8.9–10.3)
Chloride: 110 mmol/L (ref 98–111)
Creatinine, Ser: 0.88 mg/dL (ref 0.44–1.00)
GFR, Estimated: >60 mL/min (ref >60)
Glucose, Bld: 90 mg/dL (ref 70–99)
Potassium: 3.9 mmol/L (ref 3.5–5.1)
Sodium: 140 mmol/L (ref 135–145)
Total Bilirubin: 0.8 mg/dL (ref 0.3–1.2)
Total Protein: 5.9 g/dL — ABNORMAL LOW (ref 6.5–8.1)

## 2022-11-30 LAB — CBC
HCT: 38.8 % (ref 36.0–46.0)
Hemoglobin: 12.1 g/dL (ref 12.0–15.0)
MCH: 29.3 pg (ref 26.0–34.0)
MCHC: 31.2 g/dL (ref 30.0–36.0)
MCV: 93.9 fL (ref 80.0–100.0)
Platelets: 179 10*3/uL (ref 150–400)
RBC: 4.13 MIL/uL (ref 3.87–5.11)
RDW: 13.4 % (ref 11.5–15.5)
WBC: 7.1 10*3/uL (ref 4.0–10.5)
nRBC: 0 % (ref 0.0–0.2)

## 2022-11-30 NOTE — Plan of Care (Signed)

## 2022-11-30 NOTE — Care Management Important Message (Signed)
Important Message  Patient Details  Name: Shelley Mahoney MRN: [id number] Date of Birth: 08/26/1931   Medicare Important Message Given:  N/A - LOS <3 / Initial given by admissions     Johnell Comings 11/30/2022, 1:51 PM

## 2022-11-30 NOTE — Progress Notes (Addendum)
PROGRESS NOTE    YOLANDE SKODA  ZOX:[id number] DOB: 04-Feb-1932 DOA: 11/29/2022 PCP: Lauro Regulus, MD   Assessment & Plan:   Principal Problem:   Small bowel obstruction [facility]) Active Problems:   Partial small bowel obstruction (HCC)  Assessment and Plan: Partial SBO: advanced diet to regular today as per gen surg. Large BM this morning. Zofran prn for nausea/vomiting.    Possible small intestinal bacterial overgrowth: as per CT scan. Defer treatment currently    CKDIIIa: Cr is trending down from day prior. Avoid nephrotoxic meds    Hypothyroidism: restart home dose of levothyroxine    Essential Tremor: w/ hx of deep brain stimulator. Followed at [facility] neuro        DVT prophylaxis: lovenox  Code Status: full  Family Communication: Disposition Plan: likely d/c back home   Level of care: Med-Surg  Status is: Inpatient Remains inpatient appropriate because: severity of illness    Consultants:  Gen surg   Procedures:   Antimicrobials:   Subjective: Pt c/o large BM this morning   Objective: Vitals:   11/29/22 0825 11/29/22 1934 11/30/22 0419 11/30/22 0819  BP: 98/70 103/83 (!) 106/52 (!) 124/56  Pulse: 77 74 71 71  Resp: 18 16 15 18   Temp: 97.6 F (36.4 C) (!) 97 F (36.1 C) 97.6 F (36.4 C) (!) 97.5 F (36.4 C)  TempSrc:  Oral Oral   SpO2: 95% 99% 95% 97%  Weight:      Height:        Intake/Output Summary (Last 24 hours) at 11/30/2022 0831 Last data filed at 11/30/2022 0600 Gross per 24 hour  Intake 2875.18 ml  Output --  Net 2875.18 ml   Filed Weights   11/29/22 0236  Weight: 86 kg    Examination:  General exam: Appears calm and comfortable  Respiratory system: Clear to auscultation. Respiratory effort normal. Cardiovascular system: S1 & S2+. No rubs, gallops or clicks. Gastrointestinal system: Abdomen is nondistended, soft and nontender. Normal bowel sounds heard. Central nervous system: Alert and oriented. Moves all  extremities  Psychiatry: Judgement and insight appear normal. Mood & affect appropriate.     Data Reviewed: I have personally reviewed following labs and imaging studies  CBC: Recent Labs  Lab 11/29/22 0231 11/30/22 0522  WBC 13.0* 7.1  NEUTROABS 10.2*  --   HGB 14.6 12.1  HCT 45.7 38.8  MCV 92.0 93.9  PLT 222 179   Basic Metabolic Panel: Recent Labs  Lab 11/29/22 0231 11/30/22 0522  NA 141 140  K 4.5 3.9  CL 101 110  CO2 28 24  GLUCOSE 144* 90  BUN 39* 19  CREATININE 1.12* 0.88  CALCIUM 9.4 7.7*   GFR: Estimated Creatinine Clearance: 48.9 mL/min (by C-G formula based on SCr of 0.88 mg/dL). Liver Function Tests: Recent Labs  Lab 11/29/22 0231 11/30/22 0522  AST 30 19  ALT 19 15  ALKPHOS 59 42  BILITOT 0.5 0.8  PROT 7.7 5.9*  ALBUMIN 4.4 3.3*   No results for input(s): "LIPASE", "AMYLASE" in the last 168 hours. No results for input(s): "AMMONIA" in the last 168 hours. Coagulation Profile: No results for input(s): "INR", "PROTIME" in the last 168 hours. Cardiac Enzymes: No results for input(s): "CKTOTAL", "CKMB", "CKMBINDEX", "TROPONINI" in the last 168 hours. BNP (last 3 results) No results for input(s): "PROBNP" in the last 8760 hours. HbA1C: No results for input(s): "HGBA1C" in the last 72 hours. CBG: No results for input(s): "GLUCAP" in the last 168  hours. Lipid Profile: No results for input(s): "CHOL", "HDL", "LDLCALC", "TRIG", "CHOLHDL", "LDLDIRECT" in the last 72 hours. Thyroid Function Tests: No results for input(s): "TSH", "T4TOTAL", "FREET4", "T3FREE", "THYROIDAB" in the last 72 hours. Anemia Panel: No results for input(s): "VITAMINB12", "FOLATE", "FERRITIN", "TIBC", "IRON", "RETICCTPCT" in the last 72 hours. Sepsis Labs: No results for input(s): "PROCALCITON", "LATICACIDVEN" in the last 168 hours.  No results found for this or any previous visit (from the past 240 hour(s)).       Radiology Studies: CT ABDOMEN PELVIS W  CONTRAST  Result Date: 11/29/2022 CLINICAL DATA:  89 year old female with history of acute onset of nonlocalized abdominal pain. Emesis. History of lung cancer. * Tracking Code: BO * EXAM: CT ABDOMEN AND PELVIS WITH CONTRAST TECHNIQUE: Multidetector CT imaging of the abdomen and pelvis was performed using the standard protocol following bolus administration of intravenous contrast. RADIATION DOSE REDUCTION: This exam was performed according to the departmental dose-optimization program which includes automated exposure control, adjustment of the mA and/or kV according to patient size and/or use of iterative reconstruction technique. CONTRAST:  80mL OMNIPAQUE IOHEXOL 300 MG/ML  SOLN COMPARISON:  No priors. FINDINGS: Lower chest: Unremarkable. Hepatobiliary: There are 2 small subcentimeter low-attenuation lesions in the liver, too small to characterize, but statistically likely to represent cysts or biliary hamartomas (no imaging follow-up recommended). No other aggressive appearing hepatic lesions are noted. No intra or extrahepatic biliary ductal dilatation. Gallbladder is unremarkable in appearance. Pancreas: No pancreatic mass. No pancreatic ductal dilatation. No pancreatic or peripancreatic fluid collections or inflammatory changes. Spleen: Unremarkable. Adrenals/Urinary Tract: Multiple tiny subcentimeter low-attenuation lesions in both kidneys, too small to definitively characterize, but statistically likely tiny cysts (no imaging follow-up recommended). No hydroureteronephrosis. Urinary bladder is normal in appearance. Bilateral adrenal glands are normal in appearance. Stomach/Bowel: The appearance of the stomach is normal. Multiple dilated loops of mid to distal small bowel are noted, measuring up to 3.9 cm in diameter. Several small air-fluid levels are noted. In addition, in the region of the jejunum there is feculent material in the small bowel, suggesting small intestine bacterial overgrowth. Distal  small bowel is normal in caliber. No discrete transition point confidently identified. Liquid stool, gas and some solid stool are noted throughout the colon and rectum. Normal appendix. Vascular/Lymphatic: Atherosclerosis in the abdominal aorta and pelvic vasculature, without evidence of aneurysm or dissection. No lymphadenopathy noted in the abdomen or pelvis. Numerous surgical clips are noted along the pelvic sidewall bilaterally, presumably from prior lymph node dissection. Reproductive: Status post hysterectomy. Ovaries are not confidently identified may be surgically absent or atrophic. Other: Small volume of ascites.  No pneumoperitoneum. Musculoskeletal: There are no aggressive appearing lytic or blastic lesions noted in the visualized portions of the skeleton. IMPRESSION: 1. Dilated loops of small bowel, with an overall appearance suggestive of early or partial small bowel obstruction. There also appears to be probable small intestinal bacterial overgrowth (SIBO), as above. 2. Small volume of ascites. 3. Aortic atherosclerosis. 4. Additional postoperative changes and incidental findings, as above. Electronically Signed   By: Trudie Reed M.D.   On: 11/29/2022 05:14        Scheduled Meds:  aspirin EC  81 mg Oral Daily   enoxaparin (LOVENOX) injection  40 mg Subcutaneous Q24H   levothyroxine  88 mcg Oral Q0600   pramipexole  0.375 mg Oral QHS   primidone  200 mg Oral QHS   simvastatin  40 mg Oral QHS   Continuous Infusions:  sodium chloride 100  mL/hr at 11/30/22 0559     LOS: 1 day    Time spent: 35 mins     Charise Killian, MD [facility] Pager [phone]  If 7PM-7AM, please contact night-coverage [email or web address] 11/30/2022, 8:31 AM

## 2022-11-30 NOTE — Progress Notes (Signed)
[facility] SURGICAL PROGRESS NOTE (cpt [phone])  Hospital Day(s): 1.   Interval History: Patient seen and examined, no acute events or new complaints overnight. Patient reports she is feeling better this morning. She denied any abdominal pain. No fever, chills, nausea, emesis. Previously noted leukocytosis is resolved; now 7.1K. Hgb to 12.1. sCr normalized - 0.88; UO - unmeasured. No significant electrolyte derangements. She is on CLD; tolerating. She is hungry. She does endorse flatus and a BM but nothing recorded.   Review of Systems:  Constitutional: denies fever, chills  HEENT: denies cough or congestion  Respiratory: denies any shortness of breath  Cardiovascular: denies chest pain or palpitations  Gastrointestinal: denies abdominal pain, N/V Genitourinary: denies burning with urination or urinary frequency  Vital signs in last 24 hours: [min-max] current  Temp:  [97 F (36.1 C)-97.6 F (36.4 C)] 97.6 F (36.4 C) (07/11 0419) Pulse Rate:  [71-77] 71 (07/11 0419) Resp:  [15-18] 15 (07/11 0419) BP: (98-106)/(52-83) 106/52 (07/11 0419) SpO2:  [95 %-99 %] 95 % (07/11 0419)     Height: 5\' 7"  (170.2 cm) Weight: 86 kg BMI (Calculated): 29.7   Intake/Output last 2 shifts:  07/10 0701 - 07/11 0700 In: 2875.2 [P.O.:720; I.V.:2155.2] Out: -    Physical Exam:  Constitutional: alert, cooperative and no distress  HENT: normocephalic without obvious abnormality  Eyes: PERRL, EOM's grossly intact and symmetric  Respiratory: breathing non-labored at rest  Cardiovascular: regular rate and sinus rhythm  Gastrointestinal: soft, non-tender, and non-distended, no rebound/guarding. Musculoskeletal: no edema or wounds, motor and sensation grossly intact, NT    Labs:     Latest Ref Rng & Units 11/30/2022    5:22 AM 11/29/2022    2:31 AM 04/09/2021   10:49 PM  CBC  WBC 4.0 - 10.5 K/uL 7.1  13.0  6.3   Hemoglobin 12.0 - 15.0 g/dL 60.4  54.0  98.1   Hematocrit 36.0 - 46.0 %  38.8  45.7  42.7   Platelets 150 - 400 K/uL 179  222  170       Latest Ref Rng & Units 11/30/2022    5:22 AM 11/29/2022    2:31 AM 02/01/2022    3:43 PM  CMP  Glucose 70 - 99 mg/dL 90  191  91   BUN 8 - 23 mg/dL 19  39  23   Creatinine 0.44 - 1.00 mg/dL 4.78  2.95  6.21   Sodium 135 - 145 mmol/L 140  141  138   Potassium 3.5 - 5.1 mmol/L 3.9  4.5  4.7   Chloride 98 - 111 mmol/L 110  101  102   CO2 22 - 32 mmol/L 24  28    Calcium 8.9 - 10.3 mg/dL 7.7  9.4  8.9   Total Protein 6.5 - 8.1 g/dL 5.9  7.7  7.4   Total Bilirubin 0.3 - 1.2 mg/dL 0.8  0.5  0.3   Alkaline Phos 38 - 126 U/L 42  59  74   AST 15 - 41 U/L 19  30  21    ALT 0 - 44 U/L 15  19       Imaging studies:   KUB (11/30/2022) personally reviewed with increased air in colon, no free air, and radiologist report pending...:    Assessment/Plan: (ICD-10's: K32.609) 89 y.o. 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               -  Okay to advance diet as tolerated today             - No emergent surgical intervention - Monitor abdominal examination; on-going bowel function - Pain control prn; antiemetics prn - Monitor leukocytosis; resolved             - Mobilize as tolerated             - Further management per primary service   - Discharge Planning: Will advance diet a tolerated today. She will benefit from CT enterography in a few weeks as outpatient; will arrange. If tolerates diet advancement today, I think she can be discharged home in next 24 hours.   All of the above findings and recommendations were discussed with the patient, and the medical team, and all of patient's questions were answered to her expressed satisfaction.  -- Lynden Oxford, PA-C [facility] 11/30/2022, 7:36 AM M-F: 7am - 4pm

## 2022-12-01 ENCOUNTER — Inpatient Hospital Stay: Payer: Medicare PPO

## 2022-12-01 DIAGNOSIS — K56609 Unspecified intestinal obstruction, unspecified as to partial versus complete obstruction: Secondary | ICD-10-CM | POA: Diagnosis not present

## 2022-12-01 DIAGNOSIS — K56600 Partial intestinal obstruction, unspecified as to cause: Secondary | ICD-10-CM | POA: Diagnosis not present

## 2022-12-01 NOTE — TOC CM/SW Note (Signed)
Transition of Care [facility]) - Inpatient Brief Assessment   Patient Details  Name: Shelley Mahoney MRN: [id number] Date of Birth: Jan 25, 1932  Transition of Care [facility]) CM/SW Contact:    Margarito Liner, LCSW Phone Number: 12/01/2022, 11:53 AM   Clinical Narrative: Patient has orders to discharge home today. Patient lives at [facility] and per RN at the facility, she is independent. No spouse or family that leaves close by. No TOC needs identified. CSW signing off.  Transition of Care Asessment: Insurance and Status: Insurance coverage has been reviewed Patient has primary care physician: Yes Home environment has been reviewed: [facility] Prior level of function:: Independent Prior/Current Home Services: No current home services Social Determinants of Health Reivew: SDOH reviewed no interventions necessary Readmission risk has been reviewed: Yes Transition of care needs: no transition of care needs at this time

## 2022-12-01 NOTE — Discharge Summary (Signed)
Physician Discharge Summary  Shelley Mahoney WUJ:[id number] DOB: Aug 09, 1931 DOA: 11/29/2022  PCP: Lauro Regulus, MD  Admit date: 11/29/2022 Discharge date: 12/01/2022  Admitted From: [facility]/ILF Disposition: [place]/ILF  Recommendations for Outpatient Follow-up:  Follow up with PCP in 1-2 weeks F/u w/ gen surg, Dr. Everlene Farrier, 12/25/22 at 9:00AM   Home Health: no  Equipment/Devices:  Discharge Condition: stable CODE STATUS: DNR  Diet recommendation: Regular   Brief/Interim Summary: HPI was taken from Dr. Alvester Morin: Shelley Mahoney is a 89 y.o. female with medical history significant of stage III CKD, hypothyroidism, neurodegenerative disorder status post deep brain stimulator presenting with small bowel obstruction and SIBO.  Patient reports progressive onset of generalized abdominal pain since last night.  Was unable to sleep secondary to pain.  Positive nausea and vomiting associated with symptoms.  No diarrhea.  Patient reports remote episodes like this in the past but nothing recent.  Patient denies any prior abdominal surgeries though is noted had an abdominal hysterectomy in the past.  No reported change in diet.  No fevers or chills.  No chest pain or shortness of breath.  Symptoms have progressively worsened over the course of the morning.  Patient currently lives at home. Presented to the ER afebrile, hemodynamically stable.  White count 13, hemoglobin 14.6, platelets 222, creatinine 1.12.  CT abdomen pelvis with dilated bowel loops of small bowel concerning for early partial small bowel obstruction as well as?  SIBO.  Discharge Diagnoses:  Principal Problem:   Small bowel obstruction (HCC) Active Problems:   Partial small bowel obstruction (HCC)  Partial SBO: advanced diet to regular today as per gen surg. Large BM again today. Zofran prn for nausea/vomiting. Resolved    Possible small intestinal bacterial overgrowth: as per CT scan. Defer treatment currently. F/u  outpatient w/ gen surg in 3-4 weeks    CKDIIIa: Cr is trending down from day prior. Avoid nephrotoxic meds    Hypothyroidism: continue on home dose of levothyroxine    Essential Tremor: w/ hx of deep brain stimulator. Followed at [facility] neuro   Discharge Instructions  Discharge Instructions     Diet general   Complete by: As directed    Discharge instructions   Complete by: As directed    F/u w/ PCP in 1-2 weeks. F/u gen surg, Dr. Everlene Farrier, in 3-4 weeks   Increase activity slowly   Complete by: As directed       Allergies as of 12/01/2022   No Known Allergies      Medication List     TAKE these medications    ASPIRIN 81 PO Take 81 mg by mouth daily.   cholecalciferol 1000 units tablet Commonly known as: VITAMIN D Take 1,000 Units by mouth daily.   gabapentin 300 MG capsule Commonly known as: NEURONTIN Take 300 mg by mouth 3 (three) times daily.   IRON CR PO Take 45 mg by mouth daily.   levothyroxine 88 MCG tablet Commonly known as: SYNTHROID Take 1 tablet (88 mcg total) by mouth once daily Monday-Saturday. Take HALF tablet (44 mcg total) on Sundays. Take on an empty stomach with a glass of water.   MAGNESIUM GLYCINATE PLUS PO Take 400 mg by mouth 2 (two) times daily.   PHILLIPS COLON HEALTH PO Take 1 tablet by mouth daily.   pramipexole 0.125 MG tablet Commonly known as: MIRAPEX Take 0.25 mg by mouth at bedtime.   PreserVision AREDS Caps Take by mouth.   primidone 50 MG tablet Commonly  known as: MYSOLINE Take 200 mg by mouth at bedtime.   simvastatin 40 MG tablet Commonly known as: ZOCOR Take 40 mg by mouth at bedtime.   torsemide 10 MG tablet Commonly known as: DEMADEX Take 10 mg by mouth daily.        Follow-up Information     Leafy Ro, MD. Go on 12/25/2022.   Specialty: General Surgery Why: Go to appointment on 08/05 at 900 AM...Marland KitchenMarland KitchenHospital follow up; pSBO.....needs CT Enterography Prior Contact information: [address] [place] [address] [phone]                No Known Allergies  Consultations: Gen surg    Procedures/Studies: DG ABD ACUTE 2+V W 1V CHEST  Result Date: 12/01/2022 CLINICAL DATA:  Recurrent partial small bowel obstruction secondary to adhesions. EXAM: DG ABDOMEN ACUTE WITH 1 VIEW CHEST COMPARISON:  Chest x-rays dated 11/30/2022 and 04/21/2016 FINDINGS: Single-view of the chest: Heart size and mediastinal contours are stable. Chronic scarring/atelectasis within the LEFT lower lung. Coarse interstitial lung markings bilaterally. Mild bibasilar atelectasis and/or small pleural effusions. No confluent opacity to suggest a developing pneumonia. No pneumothorax is seen. Osseous structures about the chest are unremarkable. Three images of the abdomen: No dilated large or small bowel loops are appreciated. No evidence of soft tissue mass or abnormal fluid collection. No evidence of free intraperitoneal air. No evidence of renal or ureteral calculi. Surgical clips within the bilateral pelves. Degenerative spondylosis of the scoliotic thoracolumbar spine, at least moderate in degree. No acute-appearing osseous abnormality. IMPRESSION: 1. No evidence of bowel obstruction or perforation. 2. Mild bibasilar atelectasis and/or small pleural effusions. 3. Chronic scarring/atelectasis within the LEFT lower lung. Electronically Signed   By: Bary Richard M.D.   On: 12/01/2022 08:40   DG ABD ACUTE 2+V W 1V CHEST  Result Date: 11/30/2022 CLINICAL DATA:  [id number] SBO (small bowel obstruction) (HCC) [id number] EXAM: DG ABDOMEN ACUTE WITH 1 VIEW CHEST COMPARISON:  CT Chest 11/16/22, CT AP 11/29/22 FINDINGS: Left-sided nerve stimulator battery pack projects over the left hemithorax. No pleural effusion. No pneumothorax. Normal cardiac and mediastinal contours. Linear opacity in the left mid lung field is favored to represent atelectasis versus scarring. No radiographically apparent displaced rib  fractures. Degenerative changes of the bilateral AC joints. There are a few prominent loops of small bowel in the mid and left hemiabdomen, which radiographically appear improved compared to prior CT abdomen and pelvis. Rightward curvature of the lumbar spine with multilevel degenerative disc disease. IMPRESSION: 1. No acute cardiopulmonary process. 2. There are a few prominent loops of small bowel in the mid and left hemiabdomen, which radiographically appear improved compared to prior CT abdomen and pelvis. Electronically Signed   By: Lorenza Cambridge M.D.   On: 11/30/2022 09:19   CT CHEST WO CONTRAST  Result Date: 11/29/2022 CLINICAL DATA:  89 year old female with history of abnormal chest x-ray. History of lung cancer in 2017 treated with radiation therapy. Follow-up study. * Tracking Code: BO * EXAM: CT CHEST WITHOUT CONTRAST TECHNIQUE: Multidetector CT imaging of the chest was performed following the standard protocol without IV contrast. RADIATION DOSE REDUCTION: This exam was performed according to the departmental dose-optimization program which includes automated exposure control, adjustment of the mA and/or kV according to patient size and/or use of iterative reconstruction technique. COMPARISON:  Chest CT 05/30/2021. FINDINGS: Cardiovascular: Heart size is normal. There is no significant pericardial fluid, thickening or pericardial calcification. There is aortic atherosclerosis, as well  as atherosclerosis of the great vessels of the mediastinum and the coronary arteries, including calcified atherosclerotic plaque in the left anterior descending, left circumflex and right coronary arteries. Mild calcifications of the aortic valve. Mediastinum/Nodes: No pathologically enlarged mediastinal or hilar lymph nodes. Please note that accurate exclusion of hilar adenopathy is limited on noncontrast CT scans. Esophagus is unremarkable in appearance. Several prominent borderline enlarged left axillary lymph nodes  measuring up to 9 mm in short axis, nonspecific, but similar to the prior examination. No right axillary lymphadenopathy. Lungs/Pleura: Chronic mass-like area of architectural distortion in the posterior aspect of the left upper lobe, as well as the adjacent anterior left lower lobe, stable compared to prior examinations, most compatible with areas of chronic postradiation mass-like fibrosis. No other new suspicious appearing pulmonary nodules or masses are noted. No acute consolidative airspace disease. No pleural effusions. Calcified granuloma in the right lower lobe incidentally noted. Upper Abdomen: Aortic atherosclerosis. Several low-attenuation liver lesions are noted, incompletely characterized on today's noncontrast CT examination, but similar to prior studies measuring up to 11 mm in segment 4A (axial image 146 of series 2), likely small cysts (no imaging follow-up recommended). Musculoskeletal: Electronic device in the subcutaneous fat of the upper left chest with lead extending cephalad, potentially a vagal nerve stimulator. There are no aggressive appearing lytic or blastic lesions noted in the visualized portions of the skeleton. IMPRESSION: 1. 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 2. Aortic atherosclerosis, in addition to three-vessel coronary artery disease. 3. There are calcifications of the aortic valve. Echocardiographic correlation for evaluation of potential valvular dysfunction may be warranted if clinically indicated. 4. Additional incidental findings, as above. Aortic Atherosclerosis (ICD10-I70.0). Electronically Signed   By: Trudie Reed M.D.   On: 11/29/2022 13:21   CT ABDOMEN PELVIS W CONTRAST  Result Date: 11/29/2022 CLINICAL DATA:  89 year old female with history of acute onset of nonlocalized abdominal pain. Emesis. History of lung cancer. * Tracking Code: BO * EXAM: CT  ABDOMEN AND PELVIS WITH CONTRAST TECHNIQUE: Multidetector CT imaging of the abdomen and pelvis was performed using the standard protocol following bolus administration of intravenous contrast. RADIATION DOSE REDUCTION: This exam was performed according to the departmental dose-optimization program which includes automated exposure control, adjustment of the mA and/or kV according to patient size and/or use of iterative reconstruction technique. CONTRAST:  80mL OMNIPAQUE IOHEXOL 300 MG/ML  SOLN COMPARISON:  No priors. FINDINGS: Lower chest: Unremarkable. Hepatobiliary: There are 2 small subcentimeter low-attenuation lesions in the liver, too small to characterize, but statistically likely to represent cysts or biliary hamartomas (no imaging follow-up recommended). No other aggressive appearing hepatic lesions are noted. No intra or extrahepatic biliary ductal dilatation. Gallbladder is unremarkable in appearance. Pancreas: No pancreatic mass. No pancreatic ductal dilatation. No pancreatic or peripancreatic fluid collections or inflammatory changes. Spleen: Unremarkable. Adrenals/Urinary Tract: Multiple tiny subcentimeter low-attenuation lesions in both kidneys, too small to definitively characterize, but statistically likely tiny cysts (no imaging follow-up recommended). No hydroureteronephrosis. Urinary bladder is normal in appearance. Bilateral adrenal glands are normal in appearance. Stomach/Bowel: The appearance of the stomach is normal. Multiple dilated loops of mid to distal small bowel are noted, measuring up to 3.9 cm in diameter. Several small air-fluid levels are noted. In addition, in the region of the jejunum there is feculent material in the small bowel, suggesting small intestine bacterial overgrowth. Distal small bowel is normal in caliber. No discrete  transition point confidently identified. Liquid stool, gas and some solid stool are noted throughout the colon and rectum. Normal appendix.  Vascular/Lymphatic: Atherosclerosis in the abdominal aorta and pelvic vasculature, without evidence of aneurysm or dissection. No lymphadenopathy noted in the abdomen or pelvis. Numerous surgical clips are noted along the pelvic sidewall bilaterally, presumably from prior lymph node dissection. Reproductive: Status post hysterectomy. Ovaries are not confidently identified may be surgically absent or atrophic. Other: Small volume of ascites.  No pneumoperitoneum. Musculoskeletal: There are no aggressive appearing lytic or blastic lesions noted in the visualized portions of the skeleton. IMPRESSION: 1. Dilated loops of small bowel, with an overall appearance suggestive of early or partial small bowel obstruction. There also appears to be probable small intestinal bacterial overgrowth (SIBO), as above. 2. Small volume of ascites. 3. Aortic atherosclerosis. 4. Additional postoperative changes and incidental findings, as above. Electronically Signed   By: Trudie Reed M.D.   On: 11/29/2022 05:14   (Echo, Carotid, EGD, Colonoscopy, ERCP)    Subjective: Pt c/o loose stool.   Discharge Exam: Vitals:   12/01/22 0451 12/01/22 0909  BP: 137/66 (!) 121/52  Pulse: 73 62  Resp: 16 16  Temp: 98.4 F (36.9 C) 98.1 F (36.7 C)  SpO2: 95% 98%   Vitals:   11/30/22 1612 11/30/22 2015 12/01/22 0451 12/01/22 0909  BP: 139/65 136/65 137/66 (!) 121/52  Pulse: 75 83 73 62  Resp: 19 20 16 16   Temp: 97.9 F (36.6 C) 98.7 F (37.1 C) 98.4 F (36.9 C) 98.1 F (36.7 C)  TempSrc: Oral Oral  Oral  SpO2: 98% 99% 95% 98%  Weight:      Height:        General: Pt is alert, awake, not in acute distress Cardiovascular: S1/S2 +, no rubs, no gallops Respiratory: CTA bilaterally, no wheezing, no rhonchi Abdominal: Soft, NT, ND, bowel sounds + Extremities: no edema, no cyanosis    The results of significant diagnostics from this hospitalization (including imaging, microbiology, ancillary and laboratory) are  listed below for reference.     Microbiology: No results found for this or any previous visit (from the past 240 hour(s)).   Labs: BNP (last 3 results) No results for input(s): "BNP" in the last 8760 hours. Basic Metabolic Panel: Recent Labs  Lab 11/29/22 0231 11/30/22 0522  NA 141 140  K 4.5 3.9  CL 101 110  CO2 28 24  GLUCOSE 144* 90  BUN 39* 19  CREATININE 1.12* 0.88  CALCIUM 9.4 7.7*   Liver Function Tests: Recent Labs  Lab 11/29/22 0231 11/30/22 0522  AST 30 19  ALT 19 15  ALKPHOS 59 42  BILITOT 0.5 0.8  PROT 7.7 5.9*  ALBUMIN 4.4 3.3*   No results for input(s): "LIPASE", "AMYLASE" in the last 168 hours. No results for input(s): "AMMONIA" in the last 168 hours. CBC: Recent Labs  Lab 11/29/22 0231 11/30/22 0522  WBC 13.0* 7.1  NEUTROABS 10.2*  --   HGB 14.6 12.1  HCT 45.7 38.8  MCV 92.0 93.9  PLT 222 179   Cardiac Enzymes: No results for input(s): "CKTOTAL", "CKMB", "CKMBINDEX", "TROPONINI" in the last 168 hours. BNP: Invalid input(s): "POCBNP" CBG: No results for input(s): "GLUCAP" in the last 168 hours. D-Dimer No results for input(s): "DDIMER" in the last 72 hours. Hgb A1c No results for input(s): "HGBA1C" in the last 72 hours. Lipid Profile No results for input(s): "CHOL", "HDL", "LDLCALC", "TRIG", "CHOLHDL", "LDLDIRECT" in the last 72 hours. Thyroid function  studies No results for input(s): "TSH", "T4TOTAL", "T3FREE", "THYROIDAB" in the last 72 hours.  Invalid input(s): "FREET3" Anemia work up No results for input(s): "VITAMINB12", "FOLATE", "FERRITIN", "TIBC", "IRON", "RETICCTPCT" in the last 72 hours. Urinalysis No results found for: "COLORURINE", "APPEARANCEUR", "LABSPEC", "PHURINE", "GLUCOSEU", "HGBUR", "BILIRUBINUR", "KETONESUR", "PROTEINUR", "UROBILINOGEN", "NITRITE", "LEUKOCYTESUR" Sepsis Labs Recent Labs  Lab 11/29/22 0231 11/30/22 0522  WBC 13.0* 7.1   Microbiology No results found for this or any previous visit (from the  past 240 hour(s)).   Time coordinating discharge: Over 30 minutes  SIGNED:   Charise Killian, MD  [facility] 12/01/2022, 11:53 AM Pager   If 7PM-7AM, please contact night-coverage [email or web address]

## 2022-12-01 NOTE — Progress Notes (Signed)
[facility] SURGICAL PROGRESS NOTE (cpt [id number])  Hospital Day(s): 2.   Interval History: Patient seen and examined, no acute events or new complaints overnight. Patient reports she is tired this morning; did not get much rest. No abdominal pain, nausea, emesis. No fever, chills, nausea, emesis. No new labs. She is on regular diet; tolerating. She is passing flatus and having BM. Ambulating without issue.   Review of Systems:  Constitutional: denies fever, chills  HEENT: denies cough or congestion  Respiratory: denies any shortness of breath  Cardiovascular: denies chest pain or palpitations  Gastrointestinal: denies abdominal pain, N/V Genitourinary: denies burning with urination or urinary frequency  Vital signs in last 24 hours: [min-max] current  Temp:  [97.5 F (36.4 C)-98.7 F (37.1 C)] 98.4 F (36.9 C) (07/12 0451) Pulse Rate:  [71-83] 73 (07/12 0451) Resp:  [16-20] 16 (07/12 0451) BP: (124-139)/(56-66) 137/66 (07/12 0451) SpO2:  [95 %-99 %] 95 % (07/12 0451)     Height: 5\' 7"  (170.2 cm) Weight: 86 kg BMI (Calculated): 29.7   Intake/Output last 2 shifts:  07/11 0701 - 07/12 0700 In: 240 [P.O.:240] Out: -    Physical Exam:  Constitutional: alert, cooperative and no distress  HENT: normocephalic without obvious abnormality  Eyes: PERRL, EOM's grossly intact and symmetric  Respiratory: breathing non-labored at rest  Cardiovascular: regular rate and sinus rhythm  Gastrointestinal: soft, non-tender, and non-distended, no rebound/guarding. Musculoskeletal: no edema or wounds, motor and sensation grossly intact, NT    Labs:     Latest Ref Rng & Units 11/30/2022    5:22 AM 11/29/2022    2:31 AM 04/09/2021   10:49 PM  CBC  WBC 4.0 - 10.5 K/uL 7.1  13.0  6.3   Hemoglobin 12.0 - 15.0 g/dL 60.4  54.0  98.1   Hematocrit 36.0 - 46.0 % 38.8  45.7  42.7   Platelets 150 - 400 K/uL 179  222  170       Latest Ref Rng & Units 11/30/2022    5:22 AM 11/29/2022     2:31 AM 02/01/2022    3:43 PM  CMP  Glucose 70 - 99 mg/dL 90  191  91   BUN 8 - 23 mg/dL 19  39  23   Creatinine 0.44 - 1.00 mg/dL 4.78  2.95  6.21   Sodium 135 - 145 mmol/L 140  141  138   Potassium 3.5 - 5.1 mmol/L 3.9  4.5  4.7   Chloride 98 - 111 mmol/L 110  101  102   CO2 22 - 32 mmol/L 24  28    Calcium 8.9 - 10.3 mg/dL 7.7  9.4  8.9   Total Protein 6.5 - 8.1 g/dL 5.9  7.7  7.4   Total Bilirubin 0.3 - 1.2 mg/dL 0.8  0.5  0.3   Alkaline Phos 38 - 126 U/L 42  59  74   AST 15 - 41 U/L 19  30  21    ALT 0 - 44 U/L 15  19       Imaging studies:  No new imaging   Assessment/Plan: (ICD-10's: K92.609) 89 y.o. 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               - Okay to continue diet as tolerated              - No emergent surgical intervention - Monitor abdominal examination;  on-going bowel function - Pain control prn; antiemetics prn             - Mobilize as tolerated             - Further management per primary service   - Discharge Planning: Okay for discharge from surgical perspective. She will benefit from CT enterography in a few weeks as outpatient; will arrange this and follow up in 3-4 weeks.  All of the above findings and recommendations were discussed with the patient, and the medical team, and all of patient's questions were answered to her expressed satisfaction.  -- Lynden Oxford, PA-C [facility] 12/01/2022, 7:33 AM M-F: 7am - 4pm

## 2022-12-01 NOTE — Progress Notes (Signed)
Mobility Specialist - Progress Note    12/01/22 1000  Mobility  Activity Ambulated independently to bathroom  Level of Assistance Independent  Assistive Device None  Distance Ambulated (ft) 20 ft  Range of Motion/Exercises Active  Activity Response Tolerated well  Mobility Referral Yes  $Mobility charge 1 Mobility  Mobility Specialist Start Time (ACUTE ONLY) 1033  Mobility Specialist Stop Time (ACUTE ONLY) 1045  Mobility Specialist Time Calculation (min) (ACUTE ONLY) 12 min   Pt resting in bed upon entry on RA. Pt STS and ambulates to bathroom indep with no AD(did not use her walking stick). Pt returned to EOB and left with needs in reach. MD present at bedside.   Johnathan Hausen Mobility Specialist 12/01/22, 11:00 AM

## 2022-12-01 NOTE — Plan of Care (Signed)

## 2022-12-04 ENCOUNTER — Telehealth: Payer: Self-pay | Admitting: Surgery

## 2022-12-04 NOTE — Telephone Encounter (Signed)
Patient notified that central scheduling would call her to schedule this. Patient given their number to call if she has not heard from them by tomorrow. She is aware that she needs this test done prior to being seen on August 8th.

## 2022-12-04 NOTE — Telephone Encounter (Signed)
This patient has new patient appointment with Dr. Everlene Farrier on 12/25/22 for small bowel obstruction.  She was informed that she will need a CT enterography done prior.  She wants to know if this is something she needs to schedule or if we schedule for her.  She is needing some guidance and help with this.  Please call her.  Thank you.

## 2022-12-05 ENCOUNTER — Ambulatory Visit: Payer: Medicare PPO | Admitting: Dermatology

## 2022-12-05 VITALS — BP 98/51 | HR 71

## 2022-12-05 DIAGNOSIS — L578 Other skin changes due to chronic exposure to nonionizing radiation: Secondary | ICD-10-CM

## 2022-12-05 DIAGNOSIS — L821 Other seborrheic keratosis: Secondary | ICD-10-CM | POA: Diagnosis not present

## 2022-12-05 DIAGNOSIS — L814 Other melanin hyperpigmentation: Secondary | ICD-10-CM

## 2022-12-05 DIAGNOSIS — L910 Hypertrophic scar: Secondary | ICD-10-CM

## 2022-12-05 DIAGNOSIS — D225 Melanocytic nevi of trunk: Secondary | ICD-10-CM

## 2022-12-05 DIAGNOSIS — W908XXA Exposure to other nonionizing radiation, initial encounter: Secondary | ICD-10-CM

## 2022-12-05 DIAGNOSIS — D2272 Melanocytic nevi of left lower limb, including hip: Secondary | ICD-10-CM

## 2022-12-05 DIAGNOSIS — Z85820 Personal history of malignant melanoma of skin: Secondary | ICD-10-CM

## 2022-12-05 DIAGNOSIS — Z1283 Encounter for screening for malignant neoplasm of skin: Secondary | ICD-10-CM

## 2022-12-05 DIAGNOSIS — D2262 Melanocytic nevi of left upper limb, including shoulder: Secondary | ICD-10-CM

## 2022-12-05 DIAGNOSIS — D2271 Melanocytic nevi of right lower limb, including hip: Secondary | ICD-10-CM

## 2022-12-05 DIAGNOSIS — Z872 Personal history of diseases of the skin and subcutaneous tissue: Secondary | ICD-10-CM

## 2022-12-05 DIAGNOSIS — Z86018 Personal history of other benign neoplasm: Secondary | ICD-10-CM

## 2022-12-05 NOTE — Patient Instructions (Addendum)
 Seborrheic Keratosis  What causes seborrheic keratoses? Seborrheic keratoses are harmless, common skin growths that first appear during adult life.  As time goes by, more growths appear.  Some people may develop a large number of them.  Seborrheic keratoses appear on both covered and uncovered body parts.  They are not caused by sunlight.  The tendency to develop seborrheic keratoses can be inherited.  They vary in color from skin-colored to gray, brown, or even black.  They can be either smooth or have a rough, warty surface.   Seborrheic keratoses are superficial and look as if they were stuck on the skin.  Under the microscope this type of keratosis looks like layers upon layers of skin.  That is why at times the top layer may seem to fall off, but the rest of the growth remains and re-grows.    Treatment Seborrheic keratoses do not need to be treated, but can easily be removed in the office.  Seborrheic keratoses often cause symptoms when they rub on clothing or jewelry.  Lesions can be in the way of shaving.  If they become inflamed, they can cause itching, soreness, or burning.  Removal of a seborrheic keratosis can be accomplished by freezing, burning, or surgery. If any spot bleeds, scabs, or grows rapidly, please return to have it checked, as these can be an indication of a skin cancer.    Melanoma ABCDEs  Melanoma is the most dangerous type of skin cancer, and is the leading cause of death from skin disease.  You are more likely to develop melanoma if you: Have light-colored skin, light-colored eyes, or red or blond hair Spend a lot of time in the sun Tan regularly, either outdoors or in a tanning bed Have had blistering sunburns, especially during childhood Have a close family member who has had a melanoma Have atypical moles or large birthmarks  Early detection of melanoma is key since treatment is typically straightforward and cure rates are extremely high if we catch it early.    The first sign of melanoma is often a change in a mole or a new dark spot.  The ABCDE system is a way of remembering the signs of melanoma.  A for asymmetry:  The two halves do not match. B for border:  The edges of the growth are irregular. C for color:  A mixture of colors are present instead of an even brown color. D for diameter:  Melanomas are usually (but not always) greater than 67mm - the size of a pencil eraser. E for evolution:  The spot keeps changing in size, shape, and color.  Please check your skin once per month between visits. You can use a small mirror in front and a large mirror behind you to keep an eye on the back side or your body.   If you see any new or changing lesions before your next follow-up, please call to schedule a visit.  Please continue daily skin protection including broad spectrum sunscreen SPF 30+ to sun-exposed areas, reapplying every 2 hours as needed when you're outdoors.   Staying in the shade or wearing long sleeves, sun glasses (UVA+UVB protection) and wide brim hats (4-inch brim around the entire circumference of the hat) are also recommended for sun protection.    Due to recent changes in healthcare laws, you may see results of your pathology and/or laboratory studies on MyChart before the doctors have had a chance to review them. We understand that in some cases there  may be results that are confusing or concerning to you. Please understand that not all results are received at the same time and often the doctors may need to interpret multiple results in order to provide you with the best plan of care or course of treatment. Therefore, we ask that you please give [place] 2 business days to thoroughly review all your results before contacting the office for clarification. Should we see a critical lab result, you will be contacted sooner.   If You Need Anything After Your Visit  If you have any questions or concerns for your doctor, please call our main  line at [phone] and press option 4 to reach your doctor's medical assistant. If no one answers, please leave a voicemail as directed and we will return your call as soon as possible. Messages left after 4 pm will be answered the following business day.   You may also send [place] a message via [place]. We typically respond to MyChart messages within 1-2 business days.  For prescription refills, please ask your pharmacy to contact our office. Our fax number is [phone].  If you have an urgent issue when the clinic is closed that cannot wait until the next business day, you can page your doctor at the number below.    Please note that while we do our best to be available for urgent issues outside of office hours, we are not available 24/7.   If you have an urgent issue and are unable to reach [place], you may choose to seek medical care at your doctor's office, retail clinic, urgent care center, or emergency room.  If you have a medical emergency, please immediately call 911 or go to the emergency department.  Pager Numbers  - Dr. Nehemiah Massed: [phone]  - Dr. Laurence Ferrari: [phone]  - Dr. Nicole Kindred: [phone]  In the event of inclement weather, please call our main line at [phone] for an update on the status of any delays or closures.  Dermatology Medication Tips: Please keep the boxes that topical medications come in in order to help keep track of the instructions about where and how to use these. Pharmacies typically print the medication instructions only on the boxes and not directly on the medication tubes.   If your medication is too expensive, please contact our office at [phone] option 4 or send [place] a message through [place].   We are unable to tell what your co-pay for medications will be in advance as this is different depending on your insurance coverage. However, we may be able to find a substitute medication at lower cost or fill out paperwork to get insurance to cover a  needed medication.   If a prior authorization is required to get your medication covered by your insurance company, please allow [place] 1-2 business days to complete this process.  Drug prices often vary depending on where the prescription is filled and some pharmacies may offer cheaper prices.  The website www.goodrx.com contains coupons for medications through different pharmacies. The prices here do not account for what the cost may be with help from insurance (it may be cheaper with your insurance), but the website can give you the price if you did not use any insurance.  - You can print the associated coupon and take it with your prescription to the pharmacy.  - You may also stop by our office during regular business hours and pick up a GoodRx coupon card.  - If you need your prescription sent electronically to  a different pharmacy, notify our office through [facility] or by phone at [phone] option 4.     Si Usted Necesita Algo Despus de Su Visita  Tambin puede enviarnos un mensaje a travs de Pharmacist, community. Por lo general respondemos a los mensajes de MyChart en el transcurso de 1 a 2 das hbiles.  Para renovar recetas, por favor pida a su farmacia que se ponga en contacto con nuestra oficina. Harland Dingwall de fax es [place] [phone].  Si tiene un asunto urgente cuando la clnica est cerrada y que no puede esperar hasta el siguiente da hbil, puede llamar/localizar a su doctor(a) al nmero que aparece a continuacin.   Por favor, tenga en cuenta que aunque hacemos todo lo posible para estar disponibles para asuntos urgentes fuera del horario de [place], no estamos disponibles las 24 horas del da, los 7 das de la [place].   Si tiene un problema urgente y no puede comunicarse con nosotros, puede optar por buscar atencin mdica  en el consultorio de su doctor(a), en una clnica privada, en un centro de atencin urgente o en una sala de emergencias.  Si tiene Architect, por favor llame inmediatamente al 911 o vaya a la sala de emergencias.  Nmeros de bper  - Dr. Nehemiah Massed: [phone]  - Dra. Moye: [phone]  - Dra. Nicole Kindred: [phone]  En caso de inclemencias del [place], por favor llame a Johnsie Kindred principal al [phone] para una actualizacin sobre el [place] de cualquier retraso o cierre.  Consejos para la medicacin en dermatologa: Por favor, guarde las cajas en las que vienen los medicamentos de uso tpico para ayudarle a seguir las instrucciones sobre dnde y cmo usarlos. Las farmacias generalmente imprimen las instrucciones del medicamento slo en las cajas y no directamente en los tubos del [place].   Si su medicamento es muy caro, por favor, pngase en contacto con Zigmund Daniel llamando al [phone] y presione la opcin 4 o envenos un mensaje a travs de Pharmacist, community.   No podemos decirle cul ser su copago por los medicamentos por adelantado ya que esto es diferente dependiendo de la cobertura de su seguro. Sin embargo, es posible que podamos encontrar un medicamento sustituto a Electrical engineer un formulario para que el seguro cubra el medicamento que se considera necesario.   Si se requiere una autorizacin previa para que su compaa de seguros [place] su medicamento, por favor permtanos de 1 a 2 das hbiles para completar este proceso.  Los precios de los medicamentos varan con frecuencia dependiendo del Environmental consultant de dnde se surte la receta y alguna farmacias pueden ofrecer precios ms baratos.  El sitio web www.goodrx.com tiene cupones para medicamentos de Airline pilot. Los precios aqu no tienen en cuenta lo que podra costar con la ayuda del seguro (puede ser ms barato con su seguro), pero el sitio web puede darle el precio si no utiliz Research scientist (physical sciences).  - Puede imprimir el cupn correspondiente y llevarlo con su receta a la farmacia.  - Tambin puede pasar por nuestra oficina durante el horario de  atencin regular y Charity fundraiser una tarjeta de cupones de GoodRx.  - Si necesita que su receta se enve electrnicamente a una farmacia diferente, informe a nuestra oficina a travs de MyChart de [place] o por telfono llamando al [phone] y presione la opcin 4.

## 2022-12-05 NOTE — Progress Notes (Signed)
Follow-Up Visit   Subjective  Shelley Mahoney is a 89 y.o. female who presents for the following: Skin Cancer Screening and Full Body Skin Exam Hx of melanoma, isk, hx of dysplasia,   The patient presents for Total-Body Skin Exam (TBSE) for skin cancer screening and mole check. The patient has spots, moles and lesions to be evaluated, some may be new or changing and the patient may have concern these could be cancer.     The following portions of the chart were reviewed this encounter and updated as appropriate: medications, allergies, medical history  Review of Systems:  No other skin or systemic complaints except as noted in HPI or Assessment and Plan.  Objective  Well appearing patient in no apparent distress; mood and affect are within normal limits.  A full examination was performed including scalp, head, eyes, ears, nose, lips, neck, chest, axillae, abdomen, back, buttocks, bilateral upper extremities, bilateral lower extremities, hands, feet, fingers, toes, fingernails, and toenails. All findings within normal limits unless otherwise noted below.   Relevant physical exam findings are noted in the Assessment and Plan.    Assessment & Plan   SKIN CANCER SCREENING PERFORMED TODAY.  ACTINIC DAMAGE - Chronic condition, secondary to cumulative UV/sun exposure - diffuse scaly erythematous macules with underlying dyspigmentation - Recommend daily broad spectrum sunscreen SPF 30+ to sun-exposed areas, reapply every 2 hours as needed.  - Staying in the shade or wearing long sleeves, sun glasses (UVA+UVB protection) and wide brim hats (4-inch brim around the entire circumference of the hat) are also recommended for sun protection.  - Call for new or changing lesions.  LENTIGINES, SEBORRHEIC KERATOSES, HEMANGIOMAS - Benign normal skin lesions - Benign-appearing - Call for any changes  Multiple sks at back    HYPERTROPHIC SCAR Exam: Firm violaceous papule at upper abdomen,  ISK shaved off 3/24- bx proven  Benign-appearing.  Call clinic for new or changing lesions. Discussed option of intralesional triamcinolone if this becomes bothersome.   Treatment Plan: Observe.   MELANOCYTIC NEVI - Tan-brown and/or pink-flesh-colored symmetric macules and papules - Benign appearing on exam today - Observation - Call clinic for new or changing moles - Recommend daily use of broad spectrum spf 30+ sunscreen to sun-exposed areas.  Nevus    Left buttock: 3-54mm brown macules    Right 2nd Toe: 4.60mm regular brown macule  Left great toe 3 mm light brown macule   Left posterior shoulder  4 x 3 medium brown macule   Left medial popliteal  4 x 3 medium dark brown macule   Benign-appearing, stable.  Observation.  Call clinic for new or changing moles.  Recommend daily use of broad spectrum spf 30+ sunscreen to sun-exposed areas.    HISTORY OF MELANOMA Left posterior upper arm , Breslow 0.4 mm, level III, WLE 08/23/22 [place], [place] testing Class 1A  - No evidence of recurrence today - No lymphadenopathy, Slight nodular induration at right inferior mandible, will continue to monitor  - Recommend regular full body skin exams - Recommend daily broad spectrum sunscreen SPF 30+ to sun-exposed areas, reapply every 2 hours as needed.  - Call if any new or changing lesions are noted between office visits  HISTORY OF DYSPLASTIC NEVUS Right upper forehead severe atypia 09/22/19 No evidence of recurrence today Recommend regular full body skin exams Recommend daily broad spectrum sunscreen SPF 30+ to sun-exposed areas, reapply every 2 hours as needed.  Call if any new or changing lesions are noted  between office visits    Return in about 3 months (around 03/07/2023) for TBSE hx of melanoma .  I, Asher Muir, CMA, am acting as scribe for Willeen Niece, MD.   Documentation: I have reviewed the above documentation for accuracy and completeness, and I agree with  the above.  Willeen Niece, MD

## 2022-12-12 ENCOUNTER — Ambulatory Visit
Admission: RE | Admit: 2022-12-12 | Discharge: 2022-12-12 | Disposition: A | Discharge status: Home / Self Care | Payer: Medicare PPO | Source: Ambulatory Visit | Attending: Physician Assistant | Admitting: Physician Assistant

## 2022-12-12 DIAGNOSIS — K56609 Unspecified intestinal obstruction, unspecified as to partial versus complete obstruction: Secondary | ICD-10-CM | POA: Diagnosis not present

## 2022-12-12 MED ORDER — BARIUM SULFATE 0.1 % PO SUSP
1350.0000 mL | Freq: Once | ORAL | Status: AC
  Administered 2022-12-12: 1350 mL via ORAL

## 2022-12-12 MED ORDER — IOHEXOL 300 MG/ML  SOLN
100.0000 mL | Freq: Once | INTRAMUSCULAR | Status: AC | PRN
  Administered 2022-12-12: 100 mL via INTRAVENOUS

## 2022-12-25 ENCOUNTER — Ambulatory Visit: Payer: Medicare PPO | Admitting: Surgery

## 2022-12-25 ENCOUNTER — Encounter: Payer: Self-pay | Admitting: Surgery

## 2022-12-25 VITALS — BP 108/47 | HR 72 | Temp 97.9°F | Ht 67.5 in | Wt 180.6 lb

## 2022-12-25 DIAGNOSIS — K56609 Unspecified intestinal obstruction, unspecified as to partial versus complete obstruction: Secondary | ICD-10-CM | POA: Diagnosis not present

## 2022-12-25 NOTE — Progress Notes (Signed)
Outpatient Surgical Follow Up  12/25/2022  Shelley Mahoney is an 89 y.o. female.   Chief Complaint  Patient presents with   Hospitalization Follow-up    SBO    HPI: SBO resolved, . Taking po . No fevers or chill. + constipation. CT enterography pers reviewed, no evidence of SB tumor or definitive mechanical processes  Past Medical History:  Diagnosis Date   Carotid stenosis    Carotid stenosis    Chronic kidney disease    Stage 3, GFR30-59 ml-min   CKD (chronic kidney disease)    Dysplastic nevus 09/22/2019   Right upper forehead. Severe atypia.   Hypothyroidism    Lung cancer (HCC) 2018   Melanoma (HCC) 08/08/2022   L post upper arm, Breslow 0.4 mm, level III, WLE 08/23/22 [place], [place] testing Class 1A   Neuro-degenerative disorders (HCC)    Personal history of radiation therapy    Uterine cancer (HCC)    ovarian    Past Surgical History:  Procedure Laterality Date   ABDOMINAL HYSTERECTOMY     Total-does not have ovaries   COLONOSCOPY WITH PROPOFOL N/A 12/08/2014   Procedure: COLONOSCOPY WITH PROPOFOL;  Surgeon: Christena Deem, MD;  Location: [facility] ENDOSCOPY;  Service: Endoscopy;  Laterality: N/A;   EYE SURGERY     cataracts ou   FLEXIBLE BRONCHOSCOPY N/A 04/21/2016   Procedure: FLEXIBLE BRONCHOSCOPY;  Surgeon: Merwyn Katos, MD;  Location: [facility] ORS;  Service: Pulmonary;  Laterality: N/A;   THYROID SURGERY      Family History  Problem Relation Age of Onset   Breast cancer Mother    Other Father        erythrocytosis    Colon cancer Maternal Grandmother     Social History:  reports that she has never smoked. She has never used smokeless tobacco. She reports that she does not drink alcohol and does not use drugs.  Allergies: No Known Allergies  Medications reviewed.    ROS Full ROS performed and is otherwise negative other than what is stated in HPI   BP (!) 108/47   Pulse 72   Temp 97.9 F (36.6 C) (Oral)   Ht 5' 7.5" (1.715 m)   Wt 180  lb 9.6 oz (81.9 kg)   SpO2 96%   BMI 27.87 kg/m   Physical Exam Vitals and nursing note reviewed. Exam conducted with a chaperone present.  Constitutional:      General: She is not in acute distress.    Appearance: Normal appearance. She is not ill-appearing.  Cardiovascular:     Rate and Rhythm: Normal rate and regular rhythm.     Heart sounds: No murmur heard. Pulmonary:     Effort: Pulmonary effort is normal. No respiratory distress.     Breath sounds: Normal breath sounds. No stridor.  Abdominal:     General: Abdomen is flat. There is no distension.     Palpations: Abdomen is soft. There is no mass.     Tenderness: There is no abdominal tenderness. There is no guarding or rebound.     Hernia: No hernia is present.  Skin:    General: Skin is warm and dry.     Capillary Refill: Capillary refill takes less than 2 seconds.  Neurological:     General: No focal deficit present.     Mental Status: She is alert.  Psychiatric:        Mood and Affect: Mood normal.        Behavior: Behavior  normal.        Thought Content: Thought content normal.        Judgment: Judgment normal.       Assessment/Plan: SBO prior lower laparotomy likely adhesions. No definitive reversible causes. Recommend fiber and prn mirlax. May f/u w pcp. Please note that I spent 30 minutes in this encounter including person reviewing imaging studies, coordinating his care, placing orders and performing documentation  Sterling Big, MD [facility] General Surgeon

## 2022-12-25 NOTE — Patient Instructions (Signed)
Small Bowel Series  A small bowel series is an X-ray test. It is used to check for problems in the small bowel. The small bowel is also called the small intestine. For this test, you will drink a liquid called barium. The liquid (contrast liquid) shows up well on X-rays. This makes it easier for your doctor to see any problems. The test can help to find out why you have symptoms such as: Belly (abdominal) pain. Bloating. Watery poop (diarrhea). Tell your doctor about: Any allergies you have, especially allergies to liquids used in imaging tests. All medicines you are taking. This includes vitamins, herbs, eye drops, creams, and over-the-counter medicines. Any blood disorders you have. Any surgeries you have had. Any medical conditions you have. Whether you are pregnant or may be pregnant. Whether you are breastfeeding. What are the risks? Usually, this is a safe test. However, problems may happen, such as: Feeling like you may vomit (nauseous) after drinking the contrast liquid. Cramps. Trouble pooping (constipation). A blockage in your small bowel getting worse. Exposure to a very small amount of radiation. Allergic reaction to the contrast liquid. This is rare. What happens before the test? Follow instructions from your doctor about what you cannot eat or drink. Ask your doctor about changing or stopping: Your normal medicines. Vitamins, herbs, and supplements. Over-the-counter medicines. What happens during the test? You will drink the contrast liquid. It looks like a milkshake. Using a type of X-ray, the doctor will watch the contrast liquid as it moves through: The part of your body that moves food from your mouth to your stomach (esophagus). Your stomach. Your small bowel. Plain X-ray pictures will be taken often as the contrast liquid moves through your small bowel. These may be taken every 15-60 minutes. You may need to change positions often so the doctor can see all of  the small bowel. You may need to stand up or lie on a table. The table may move or tilt. You may need to turn from side to side. The procedure may vary among doctors and hospitals. What can I expect after the test? Your poop (stool) may be white or gray for 2-3 days until all the contrast liquid has passed out of your body in your poop. It is up to you to get the results of your test. Ask how to get your results when they are ready. Talk with your doctor about what your results mean. Follow these instructions at home:  Return to your normal diet as told by your doctor. Return to your normal activities when your doctor says that it is safe. Check your poop to make sure that it returns to normal color within a few days. If told, take actions to prevent trouble pooping and to help get the contrast liquid out of your body. You may need to: Drink enough fluid to keep your pee (urine) pale yellow. Take medicines. You will be told what medicines to take. Eat foods that are high in fiber. These include beans, whole grains, and fresh fruits and vegetables. Limit foods that are high in fat and sugar. These include fried or sweet foods. Contact a doctor if: You have trouble pooping for more than 2 days. Your poop still looks white or chalky after 3 days. You have cramps or pain. You have watery poop. You have swelling of your belly. You feel like you may vomit or you vomit. You have a fever. Get help right away if: You are not able to  poop. You cannot pass gas. You have belly pain that gets worse. You have itchy, red, swollen areas of skin (hives). Your throat swells. You have trouble breathing. You have a very hard and bloated (distended) belly. These symptoms may be an emergency. Get help right away. Call your local emergency services (911 in the [place] Do not wait to see if the symptoms will go away. Do not drive yourself to the hospital. Summary A small bowel series is an X-ray test.  It is used to check for problems in the small bowel. For this test, you will drink a contrast liquid called barium. The liquid shows up well on X-rays and makes it easier for your doctor to see problems. After the test, your poop may be white or gray for 2-3 days until all the contrast liquid has passed out of your body. This information is not intended to replace advice given to you by your health care provider. Make sure you discuss any questions you have with your health care provider. Document Revised: 01/19/2021 Document Reviewed: 06/21/2020 Elsevier Patient Education  2024 [employer].

## 2023-03-27 ENCOUNTER — Ambulatory Visit: Payer: Medicare PPO | Admitting: Dermatology

## 2023-03-27 DIAGNOSIS — L578 Other skin changes due to chronic exposure to nonionizing radiation: Secondary | ICD-10-CM

## 2023-03-27 DIAGNOSIS — Z1283 Encounter for screening for malignant neoplasm of skin: Secondary | ICD-10-CM | POA: Diagnosis not present

## 2023-03-27 DIAGNOSIS — L570 Actinic keratosis: Secondary | ICD-10-CM | POA: Diagnosis not present

## 2023-03-27 DIAGNOSIS — L814 Other melanin hyperpigmentation: Secondary | ICD-10-CM

## 2023-03-27 DIAGNOSIS — D1801 Hemangioma of skin and subcutaneous tissue: Secondary | ICD-10-CM

## 2023-03-27 DIAGNOSIS — Z86018 Personal history of other benign neoplasm: Secondary | ICD-10-CM

## 2023-03-27 DIAGNOSIS — D2272 Melanocytic nevi of left lower limb, including hip: Secondary | ICD-10-CM

## 2023-03-27 DIAGNOSIS — D225 Melanocytic nevi of trunk: Secondary | ICD-10-CM

## 2023-03-27 DIAGNOSIS — D2262 Melanocytic nevi of left upper limb, including shoulder: Secondary | ICD-10-CM

## 2023-03-27 DIAGNOSIS — D2271 Melanocytic nevi of right lower limb, including hip: Secondary | ICD-10-CM

## 2023-03-27 DIAGNOSIS — D229 Melanocytic nevi, unspecified: Secondary | ICD-10-CM

## 2023-03-27 DIAGNOSIS — Z872 Personal history of diseases of the skin and subcutaneous tissue: Secondary | ICD-10-CM

## 2023-03-27 DIAGNOSIS — W908XXA Exposure to other nonionizing radiation, initial encounter: Secondary | ICD-10-CM | POA: Diagnosis not present

## 2023-03-27 DIAGNOSIS — Z85820 Personal history of malignant melanoma of skin: Secondary | ICD-10-CM

## 2023-03-27 DIAGNOSIS — L821 Other seborrheic keratosis: Secondary | ICD-10-CM

## 2023-03-27 DIAGNOSIS — L910 Hypertrophic scar: Secondary | ICD-10-CM

## 2023-03-27 NOTE — Patient Instructions (Addendum)
Seborrheic Keratosis  What causes seborrheic keratoses? Seborrheic keratoses are harmless, common skin growths that first appear during adult life.  As time goes by, more growths appear.  Some people may develop a large number of them.  Seborrheic keratoses appear on both covered and uncovered body parts.  They are not caused by sunlight.  The tendency to develop seborrheic keratoses can be inherited.  They vary in color from skin-colored to gray, brown, or even black.  They can be either smooth or have a rough, warty surface.   Seborrheic keratoses are superficial and look as if they were stuck on the skin.  Under the microscope this type of keratosis looks like layers upon layers of skin.  That is why at times the top layer may seem to fall off, but the rest of the growth remains and re-grows.    Treatment Seborrheic keratoses do not need to be treated, but can easily be removed in the office.  Seborrheic keratoses often cause symptoms when they rub on clothing or jewelry.  Lesions can be in the way of shaving.  If they become inflamed, they can cause itching, soreness, or burning.  Removal of a seborrheic keratosis can be accomplished by freezing, burning, or surgery. If any spot bleeds, scabs, or grows rapidly, please return to have it checked, as these can be an indication of a skin cancer.  Actinic keratoses are precancerous spots that appear secondary to cumulative UV radiation exposure/sun exposure over time. They are chronic with expected duration over 1 year. A portion of actinic keratoses will progress to squamous cell carcinoma of the skin. It is not possible to reliably predict which spots will progress to skin cancer and so treatment is recommended to prevent development of skin cancer.  Recommend daily broad spectrum sunscreen SPF 30+ to sun-exposed areas, reapply every 2 hours as needed.  Recommend staying in the shade or wearing long sleeves, sun glasses (UVA+UVB protection) and  wide brim hats (4-inch brim around the entire circumference of the hat). Call for new or changing lesions.   Cryotherapy Aftercare  Wash gently with soap and water everyday.   Apply Vaseline and Band-Aid daily until healed.    Melanoma ABCDEs  Melanoma is the most dangerous type of skin cancer, and is the leading cause of death from skin disease.  You are more likely to develop melanoma if you: Have light-colored skin, light-colored eyes, or red or blond hair Spend a lot of time in the sun Tan regularly, either outdoors or in a tanning bed Have had blistering sunburns, especially during childhood Have a close family member who has had a melanoma Have atypical moles or large birthmarks  Early detection of melanoma is key since treatment is typically straightforward and cure rates are extremely high if we catch it early.   The first sign of melanoma is often a change in a mole or a new dark spot.  The ABCDE system is a way of remembering the signs of melanoma.  A for asymmetry:  The two halves do not match. B for border:  The edges of the growth are irregular. C for color:  A mixture of colors are present instead of an even brown color. D for diameter:  Melanomas are usually (but not always) greater than 6mm - the size of a pencil eraser. E for evolution:  The spot keeps changing in size, shape, and color.  Please check your skin once per month between visits. You can  use a small mirror in front and a large mirror behind you to keep an eye on the back side or your body.   If you see any new or changing lesions before your next follow-up, please call to schedule a visit.  Please continue daily skin protection including broad spectrum sunscreen SPF 30+ to sun-exposed areas, reapplying every 2 hours as needed when you're outdoors.   Staying in the shade or wearing long sleeves, sun glasses (UVA+UVB protection) and wide brim hats (4-inch brim around the entire circumference of the hat)  are also recommended for sun protection.    Due to recent changes in healthcare laws, you may see results of your pathology and/or laboratory studies on MyChart before the doctors have had a chance to review them. We understand that in some cases there may be results that are confusing or concerning to you. Please understand that not all results are received at the same time and often the doctors may need to interpret multiple results in order to provide you with the best plan of care or course of treatment. Therefore, we ask that you please give [place] 2 business days to thoroughly review all your results before contacting the office for clarification. Should we see a critical lab result, you will be contacted sooner.   If You Need Anything After Your Visit  If you have any questions or concerns for your doctor, please call our main line at [phone] and press option 4 to reach your doctor's medical assistant. If no one answers, please leave a voicemail as directed and we will return your call as soon as possible. Messages left after 4 pm will be answered the following business day.   You may also send [place] a message via MyChart. We typically respond to MyChart messages within 1-2 business days.  For prescription refills, please ask your pharmacy to contact our office. Our fax number is [phone].  If you have an urgent issue when the clinic is closed that cannot wait until the next business day, you can page your doctor at the number below.    Please note that while we do our best to be available for urgent issues outside of office hours, we are not available 24/7.   If you have an urgent issue and are unable to reach [place], you may choose to seek medical care at your doctor's office, retail clinic, urgent care center, or emergency room.  If you have a medical emergency, please immediately call 911 or go to the emergency department.  Pager Numbers  - Dr. Gwen Pounds: [phone]  - Dr. Roseanne Reno:  [phone]  - Dr. Katrinka Blazing: [phone]   In the event of inclement weather, please call our main line at [phone] for an update on the status of any delays or closures.  Dermatology Medication Tips: Please keep the boxes that topical medications come in in order to help keep track of the instructions about where and how to use these. Pharmacies typically print the medication instructions only on the boxes and not directly on the medication tubes.   If your medication is too expensive, please contact our office at [phone] option 4 or send [place] a message through MyChart.   We are unable to tell what your co-pay for medications will be in advance as this is different depending on your insurance coverage. However, we may be able to find a substitute medication at lower cost or fill out paperwork to get insurance to cover a needed medication.  If a prior authorization is required to get your medication covered by your insurance company, please allow [place] 1-2 business days to complete this process.  Drug prices often vary depending on where the prescription is filled and some pharmacies may offer cheaper prices.  The website www.goodrx.com contains coupons for medications through different pharmacies. The prices here do not account for what the cost may be with help from insurance (it may be cheaper with your insurance), but the website can give you the price if you did not use any insurance.  - You can print the associated coupon and take it with your prescription to the pharmacy.  - You may also stop by our office during regular business hours and pick up a GoodRx coupon card.  - If you need your prescription sent electronically to a different pharmacy, notify our office through [facility] or by phone at [phone] option 4.     Si Usted Necesita Algo Despus de Su Visita  Tambin puede enviarnos un mensaje a travs de Clinical cytogeneticist. Por lo general respondemos a los mensajes de  MyChart en el transcurso de 1 a 2 das hbiles.  Para renovar recetas, por favor pida a su farmacia que se ponga en contacto con nuestra oficina. Annie Sable de fax es [place] [phone].  Si tiene un asunto urgente cuando la clnica est cerrada y que no puede esperar hasta el siguiente da hbil, puede llamar/localizar a su doctor(a) al nmero que aparece a continuacin.   Por favor, tenga en cuenta que aunque hacemos todo lo posible para estar disponibles para asuntos urgentes fuera del horario de [place], no estamos disponibles las 24 horas del da, los 7 [address]  [address] de la [place].   Si tiene un problema urgente y no puede comunicarse con nosotros, puede optar por buscar atencin mdica  en el consultorio de su doctor(a), en una clnica privada, en un centro de atencin urgente o en una sala de emergencias.  Si tiene Engineer, drilling, por favor llame inmediatamente al 911 o vaya a la sala de emergencias.  Nmeros de bper  - Dr. Gwen Pounds: [phone]  - Dra. Roseanne Reno: [phone]  - Dr. Katrinka Blazing: [phone]   En caso de inclemencias del tiempo, por favor llame a Lacy Duverney principal al [phone] para una actualizacin sobre el [place] de cualquier retraso o cierre.  Consejos para la medicacin en dermatologa: Por favor, guarde las cajas en las que vienen los medicamentos de uso tpico para ayudarle a seguir las instrucciones sobre dnde y cmo usarlos. Las farmacias generalmente imprimen las instrucciones del medicamento slo en las cajas y no directamente en los tubos del [place].   Si su medicamento es muy caro, por favor, pngase en contacto con Rolm Gala llamando al [phone] y presione la opcin 4 o envenos un mensaje a travs de Clinical cytogeneticist.   No podemos decirle cul ser su copago por los medicamentos por adelantado ya que esto es diferente dependiendo de la cobertura de su seguro. Sin embargo, es posible que podamos encontrar un medicamento sustituto a Advice worker un formulario para que el seguro cubra el medicamento que se considera necesario.   Si se requiere una autorizacin previa para que su compaa de seguros [place] su medicamento, por favor permtanos de 1 a 2 das hbiles para completar [address]  Los precios de los medicamentos varan con frecuencia dependiendo del Environmental consultant de dnde se surte la receta y alguna farmacias pueden ofrecer precios ms baratos.  El sitio web  www.goodrx.com tiene cupones para medicamentos de Health and safety inspector. Los precios aqu no tienen en cuenta lo que podra costar con la ayuda del seguro (puede ser ms barato con su seguro), pero el sitio web puede darle el precio si no utiliz Tourist information centre manager.  - Puede imprimir el cupn correspondiente y llevarlo con su receta a la farmacia.  - Tambin puede pasar por nuestra oficina durante el horario de atencin regular y Education officer, museum una tarjeta de cupones de GoodRx.  - Si necesita que su receta se enve electrnicamente a una farmacia diferente, informe a nuestra oficina a travs de MyChart de [place] o por telfono llamando al [phone] y presione la opcin 4.

## 2023-03-27 NOTE — Progress Notes (Signed)
Follow-Up Visit   Subjective  Shelley Mahoney is a 89 y.o. female who presents for the following: Skin Cancer Screening and Full Body Skin Exam Hx of melanoma, hx of dysplastic nevus, hx of isk  The patient presents for Total-Body Skin Exam (TBSE) for skin cancer screening and mole check. The patient has spots, moles and lesions to be evaluated, some may be new or changing and the patient may have concern these could be cancer.   The following portions of the chart were reviewed this encounter and updated as appropriate: medications, allergies, medical history  Review of Systems:  No other skin or systemic complaints except as noted in HPI or Assessment and Plan.  Objective  Well appearing patient in no apparent distress; mood and affect are within normal limits.  A full examination was performed including scalp, head, eyes, ears, nose, lips, neck, chest, axillae, abdomen, back, buttocks, bilateral upper extremities, bilateral lower extremities, hands, feet, fingers, toes, fingernails, and toenails. All findings within normal limits unless otherwise noted below.   Relevant physical exam findings are noted in the Assessment and Plan.  right anterior thigh x 1 Erythematous thin papules/macules with gritty scale.     Assessment & Plan   SKIN CANCER SCREENING PERFORMED TODAY.  ACTINIC DAMAGE - Chronic condition, secondary to cumulative UV/sun exposure - diffuse scaly erythematous macules with underlying dyspigmentation - Recommend daily broad spectrum sunscreen SPF 30+ to sun-exposed areas, reapply every 2 hours as needed.  - Staying in the shade or wearing long sleeves, sun glasses (UVA+UVB protection) and wide brim hats (4-inch brim around the entire circumference of the hat) are also recommended for sun protection.  - Call for new or changing lesions.  LENTIGINES, SEBORRHEIC KERATOSES, HEMANGIOMAS - Benign normal skin lesions - Benign-appearing - Call for any  changes Multiple sks at back , chest  HYPERTROPHIC SCAR Exam: Firm violaceous papule at upper abdomen, ISK shaved off 3/24- bx proven   Benign-appearing.  Call clinic for new or changing lesions. Discussed option of intralesional triamcinolone if this becomes bothersome.    Treatment Plan: Observe.   MELANOCYTIC NEVI - Tan-brown and/or pink-flesh-colored symmetric macules and papules, including posterior legs - Benign appearing on exam today - Observation - Call clinic for new or changing moles - Recommend daily use of broad spectrum spf 30+ sunscreen to sun-exposed areas.   Nevus  Left buttock: x 4 3-2mm brown macules    Right 2nd Toe: 4.46mm regular brown macule   Left great toe 3 mm light brown macule    Left posterior shoulder  4 x 2 mm medium dark brown macule    Left medial popliteal  4 x 3 mm medium dark brown macule   Right mid back 3 mm medium dark brown macule   Benign-appearing, stable. Observation. Call clinic for new or changing moles. Recommend daily use of broad spectrum spf 30+ sunscreen to sun-exposed areas.   HISTORY OF MELANOMA Left posterior upper arm , Breslow 0.4 mm, level III, WLE 08/23/22 [place] Free, [place] testing Class 1A  - No evidence of recurrence today - No lymphadenopathy, Slight nodular induration at BL inferior mandible, will continue to monitor  - Recommend regular full body skin exams - Recommend daily broad spectrum sunscreen SPF 30+ to sun-exposed areas, reapply every 2 hours as needed.  - Call if any new or changing lesions are noted between office visits   HISTORY OF DYSPLASTIC NEVUS Right upper forehead severe atypia 09/22/19 No evidence of recurrence today  Recommend regular full body skin exams Recommend daily broad spectrum sunscreen SPF 30+ to sun-exposed areas, reapply every 2 hours as needed.  Call if any new or changing lesions are noted between office visits  Actinic keratosis right anterior thigh x 1  Vs ISK    Actinic keratoses are precancerous spots that appear secondary to cumulative UV radiation exposure/sun exposure over time. They are chronic with expected duration over 1 year. A portion of actinic keratoses will progress to squamous cell carcinoma of the skin. It is not possible to reliably predict which spots will progress to skin cancer and so treatment is recommended to prevent development of skin cancer.  Recommend daily broad spectrum sunscreen SPF 30+ to sun-exposed areas, reapply every 2 hours as needed.  Recommend staying in the shade or wearing long sleeves, sun glasses (UVA+UVB protection) and wide brim hats (4-inch brim around the entire circumference of the hat). Call for new or changing lesions.  Destruction of lesion - right anterior thigh x 1  Destruction method: cryotherapy   Informed consent: discussed and consent obtained   Lesion destroyed using liquid nitrogen: Yes   Region frozen until ice ball extended beyond lesion: Yes   Outcome: patient tolerated procedure well with no complications   Post-procedure details: wound care instructions given   Additional details:  Prior to procedure, discussed risks of blister formation, small wound, skin dyspigmentation, or rare scar following cryotherapy. Recommend Vaseline ointment to treated areas while healing.    Return in about 6 months (around 09/24/2023) for TBSE hx of melanoma.  I, Asher Muir, CMA, am acting as scribe for Willeen Niece, MD.   Documentation: I have reviewed the above documentation for accuracy and completeness, and I agree with the above.  Willeen Niece, MD

## 2023-08-21 ENCOUNTER — Encounter: Payer: Medicare PPO | Admitting: Dermatology

## 2023-10-02 ENCOUNTER — Ambulatory Visit: Payer: Medicare PPO | Admitting: Dermatology

## 2023-10-02 NOTE — Progress Notes (Signed)
 No chief complaint on file.   HPI  Shelley Mahoney is a 89 y.o. here for an acute issue.   She has a PMH of vascular disease, CKD, HLD, hypothyroidism, lung cancer, uterine cancer,who reports injuring her left rib cage about 5 days ago when she lost her balance and landing into a railing.  Continues to have pain to the area.  Is on aspirin .  No bleeding.  No shortness of breath.    ROS  Pertinent items are noted in HPI.  Outpatient Encounter Medications as of 10/02/2023  Medication Sig Dispense Refill  . aspirin  81 MG EC tablet Take 81 mg by mouth once daily    . cholecalciferol  (VITAMIN D3) 1,000 unit capsule Take 1,000 Units by mouth at bedtime    . ferrous sulfate (SLOW RELEASE IRON ORAL) Take 45 mg by mouth daily with lunch    . gabapentin (NEURONTIN) 300 MG capsule Take 1 capsule (300 mg) at noon, 1-2 capsules (300-600 mg) at dinner, and 2 capsules (600 mg) at bedtime, for restless legs syndrome 450 capsule 3  . L GASSERI/B BIFIDUM/B LONGUM (PHILLIPS' COLON HEALTH ORAL) Take 1 tablet by mouth once daily.      . levothyroxine  (SYNTHROID ) 100 MCG tablet Take 1 tablet (100 mcg total) by mouth once daily Take on an empty stomach with a glass of water at least 30-60 minutes before breakfast. 90 tablet 3  . MAGNESIUM GLYCINATE ORAL Take 1 tablet by mouth 2 (two) times daily    . potassium chloride  (KLOR-CON ) 10 MEQ ER tablet TAKE 1 TABLET BY MOUTH ONCE DAILY 90 tablet 3  . pramipexole  (MIRAPEX ) 0.125 MG tablet Take 3 tablets (0.375 mg total) by mouth at bedtime You may take an additional tablet if needed, either at bedtime or earlier in the day. 270 tablet 3  . primidone  (MYSOLINE ) 50 MG tablet TAKE 4 TABLETS (200 MG TOTAL) BY MOUTH AT BEDTIME FOR TREMOR 360 tablet 3  . simvastatin  (ZOCOR ) 40 MG tablet Take 1 tablet (40 mg total) by mouth at bedtime 90 tablet 3  . TORsemide  (DEMADEX ) 10 MG tablet Take 1 tablet (10 mg total) by mouth once daily 30 tablet 11  . vit C/E/Zn/coppr/lutein/zeaxan  (PRESERVISION AREDS-2 ORAL) Take 1 tablet by mouth 2 (two) times daily With breakfast and dinner    . propranoloL (INDERAL LA) 60 MG LA capsule TAKE 1 CAPSULE (60 MG TOTAL) BY MOUTH ONCE DAILY FOR TREMOR (Patient not taking: Reported on 10/02/2023) 90 capsule 3   No facility-administered encounter medications on file as of 10/02/2023.    Allergies as of 10/02/2023  . (No Known Allergies)    Past Medical History:  Diagnosis Date  . Aortic atherosclerosis   . Arthritis 2019  . Carotid stenosis   . Cataract cortical, senile '08 and '10  . CKD (chronic kidney disease) stage 3, GFR 30-59 ml/min (CMS/HHS-HCC) 11/18/2013  . Diverticulosis 12/08/2014  . Essential tremor   . Hyperlipidemia   . Hyperplastic colon polyp 12/08/2014  . Hypothyroidism, postsurgical   . Malignant neoplasm of upper lobe of left lung (CMS/HHS-HCC) 05/24/2016  . Melanoma of skin (CMS/HHS-HCC) 2024   Left upper arm, s/p excision  . Small bowel obstruction (CMS/HHS-HCC) 11/2022  . Uterine Cancer 10/1978    Past Surgical History:  Procedure Laterality Date  . COLONOSCOPY  04/20/2004   Hyperplastic polyp  . EGD  04/20/2004  . COLONOSCOPY  02/08/2006   Family history:  Colon polyps, hyperplastic polyp  . COLONOSCOPY  04/24/2006  Personal hx of adenomatous polyps  . THYROIDECTOMY TOTAL  11/2008  . COLONOSCOPY  07/13/2009   Tubular adenoma, personal hx of colonic polyps, repeat 07/13/2014  . COLONOSCOPY  12/08/2014   Tubular adenoma/Hyperplastic colon polyp/Repeat 71yrs/MUS  . IMPLANTATION NEUROELECTRODE W/O RECORDING Bilateral 03/18/2019   Procedure: **AIRO #1**IMPLANTATION NEUROELECTRODE W/O RECORDING - bilateral tremor DBS;  Surgeon: Shlomo Marinda LABOR, MD;  Location: [facility];  Service: Neurosurgery;  Laterality: Bilateral;  . IMPLANTATION CRANIAL NEUROSTIMULATOR Left 04/01/2019   Procedure: INSERTION OR REPLACEMENT CRANIAL NEUROSTIMULATOR PULSE GENERATOR W/CONNECTION 2 OR MORE ELECTRODE ARRAYS - new DBS  battery;  Surgeon: Shlomo Marinda LABOR, MD;  Location: [facility];  Service: Neurosurgery;  Laterality: Left;  . IMPLANTATION CRANIAL NEUROSTIMULATOR Left 11/07/2022   Procedure: INSERTION OR REPLACEMENT CRANIAL NEUROSTIMULATOR PULSE GENERATOR W/CONNECTION 2 OR MORE ELECTRODE ARRAYS;  Surgeon: Shlomo Marinda LABOR, MD;  Location: [facility];  Service: Neurosurgery;  Laterality: Left;  . CATARACT EXTRACTION Left   . HYSTERECTOMY    . THYROID SURGERY    . UPPER GASTROINTESTINAL ENDOSCOPY      Vitals:   10/02/23 1417  BP: 126/70  Pulse: 86    Physical Exam  General. Well appearing; NAD; VS reviewed     HEENT: Sclera and conjunctiva clear; EOMI,  Neck. Supple. No thyromegaly, lymphadenopathy. Lungs. Respirations unlabored; faint wheezing noted. Cardiovascular. Heart regular rate and rhythm without murmurs, gallops, or rubs. Chest wall: Tender along the left lateral chest wall without bruising noted. Extremities:  No edema. Skin. Normal color and turgor Neurologic. Alert and oriented x3   Assessment and Plan 1. Rib injury Left chest wall.  Continue Tylenol  for now.  May take occasional tramadol if needed. -     X-ray ribs left with PA chest 3 plus views; Future -     traMADoL (ULTRAM) 50 mg tablet; Take 1 tablet (50 mg total) by mouth every 8 (eight) hours as needed for up to 5 days    I have personally performed this service.  [address] [place], [place]

## 2023-10-03 ENCOUNTER — Ambulatory Visit: Admitting: Dermatology

## 2023-10-03 DIAGNOSIS — Z1283 Encounter for screening for malignant neoplasm of skin: Secondary | ICD-10-CM | POA: Diagnosis not present

## 2023-10-03 DIAGNOSIS — L820 Inflamed seborrheic keratosis: Secondary | ICD-10-CM | POA: Diagnosis not present

## 2023-10-03 DIAGNOSIS — L578 Other skin changes due to chronic exposure to nonionizing radiation: Secondary | ICD-10-CM | POA: Diagnosis not present

## 2023-10-03 DIAGNOSIS — D1801 Hemangioma of skin and subcutaneous tissue: Secondary | ICD-10-CM

## 2023-10-03 DIAGNOSIS — Z85820 Personal history of malignant melanoma of skin: Secondary | ICD-10-CM

## 2023-10-03 DIAGNOSIS — D229 Melanocytic nevi, unspecified: Secondary | ICD-10-CM

## 2023-10-03 DIAGNOSIS — Z86018 Personal history of other benign neoplasm: Secondary | ICD-10-CM

## 2023-10-03 DIAGNOSIS — L814 Other melanin hyperpigmentation: Secondary | ICD-10-CM | POA: Diagnosis not present

## 2023-10-03 DIAGNOSIS — L821 Other seborrheic keratosis: Secondary | ICD-10-CM

## 2023-10-03 NOTE — Progress Notes (Signed)
 Follow-Up Visit   Subjective  Shelley Mahoney is a 89 y.o. female who presents for the following: Skin Cancer Screening and Full Body Skin Exam  Hx of melanoma , hx of dysplastic, hx of nevi, hx of ISKs  The patient presents for Total-Body Skin Exam (TBSE) for skin cancer screening and mole check. The patient has spots, moles and lesions to be evaluated, some may be new or changing and the patient may have concern these could be cancer.    The following portions of the chart were reviewed this encounter and updated as appropriate: medications, allergies, medical history  Review of Systems:  No other skin or systemic complaints except as noted in HPI or Assessment and Plan.  Objective  Well appearing patient in no apparent distress; mood and affect are within normal limits.  A full examination was performed including scalp, head, eyes, ears, nose, lips, neck, chest, axillae, abdomen, back, buttocks, bilateral upper extremities, bilateral lower extremities, hands, feet, fingers, toes, fingernails, and toenails. All findings within normal limits unless otherwise noted below.   Relevant physical exam findings are noted in the Assessment and Plan.  left anterior thigh x 1, right anterior thigh x 1,  right medial thigh x 1 (3) Erythematous stuck-on, waxy papule or plaque  Assessment & Plan   SKIN CANCER SCREENING PERFORMED TODAY.  ACTINIC DAMAGE - Chronic condition, secondary to cumulative UV/sun exposure - diffuse scaly erythematous macules with underlying dyspigmentation - Recommend daily broad spectrum sunscreen SPF 30+ to sun-exposed areas, reapply every 2 hours as needed.  - Staying in the shade or wearing long sleeves, sun glasses (UVA+UVB protection) and wide brim hats (4-inch brim around the entire circumference of the hat) are also recommended for sun protection.  - Call for new or changing lesions.  LENTIGINES, SEBORRHEIC KERATOSES, HEMANGIOMAS - Benign normal skin  lesions - Benign-appearing - Call for any changes Multiple sks at back   MELANOCYTIC NEVI - Tan-brown and/or pink-flesh-colored symmetric macules and papules - Left buttock: x 5, 3-38mm brown macules  - Right 2nd Toe 3.51mm regular brown macule - Left great toe  3 mm light brown macule  - Left posterior shoulder 4 x 2 mm medium dark brown macule  - Left medial popliteal  4 x 3 mm medium dark brown macule  - Right mid back 3 mm medium dark brown macule  - Benign appearing on exam today - Observation - Call clinic for new or changing moles - Recommend daily use of broad spectrum spf 30+ sunscreen to sun-exposed areas.    HISTORY OF MELANOMA Left posterior upper arm , Breslow 0.4 mm, level III, WLE 08/23/22 [place], [place] testing Class 1A  - No evidence of recurrence today - No lymphadenopathy - Recommend regular full body skin exams - Recommend daily broad spectrum sunscreen SPF 30+ to sun-exposed areas, reapply every 2 hours as needed.  - Call if any new or changing lesions are noted between office visits   HISTORY OF DYSPLASTIC NEVUS Right upper forehead severe atypia 09/22/19 No evidence of recurrence today Recommend regular full body skin exams Recommend daily broad spectrum sunscreen SPF 30+ to sun-exposed areas, reapply every 2 hours as needed.  Call if any new or changing lesions are noted between office visits   INFLAMED SEBORRHEIC KERATOSIS (3) left anterior thigh x 1, right anterior thigh x 1,  right medial thigh x 1 (3) Symptomatic, irritating, patient would like treated. Destruction of lesion - left anterior thigh x 1, right  anterior thigh x 1,  right medial thigh x 1 (3)  Destruction method: cryotherapy   Informed consent: discussed and consent obtained   Lesion destroyed using liquid nitrogen: Yes   Region frozen until ice ball extended beyond lesion: Yes   Outcome: patient tolerated procedure well with no complications   Post-procedure details: wound care  instructions given   Additional details:  Prior to procedure, discussed risks of blister formation, small wound, skin dyspigmentation, or rare scar following cryotherapy. Recommend Vaseline ointment to treated areas while healing.  Return in about 6 months (around 04/04/2024) for TBSE.  I, Randee Busing, CMA, am acting as scribe for Artemio Larry, MD.   Documentation: I have reviewed the above documentation for accuracy and completeness, and I agree with the above.  Artemio Larry, MD

## 2023-10-03 NOTE — Patient Instructions (Addendum)

## 2023-10-19 ENCOUNTER — Encounter: Payer: Self-pay | Admitting: Dermatology

## 2023-12-25 NOTE — Procedures (Signed)
 Deep Brain Stimulation Therapy Programming Note    Service Date:  12/25/2023 Performed by: Luke Brow PA-C  Patient  Name:  Shelley Mahoney  Medical Record Number:  [id number]  Age:  89 y.o. Sex:  female    Physical Exam:  Vital Signs: BP 109/64 (Patient Position: Standing)   Pulse 77   Wt 83.9 kg (185 lb)   SpO2 97%   BMI 28.98 kg/m   Please see progress note for further physical exam findings.  DBS Programming:  DBS Programming was performed. Total time spent programming was 15 minutes.     Indication Diagnosis: Essential Tremor Lead Location: Bilateral VIM Implant Date: 2020 Most recent IPG Placement: 2024 Surgeon: Dr. Shlomo   Battery Information DBS battery location:  Left Chest Device:  [employer] Infinity   DBS IPG:  Battery: 2.89   All impedances normal    Initial Settings:    Grp L contact mA PW F Limits R contact mA PW F Limits  Feb 2025 1-2+ 5.35 100 190 4.5-5.5 11-C+ 3.0 60 130 3.00-3.00  April 1-2+ 5.35 90 190  11-C+ 3.0 60 130                           *[employer]     Selected Programming notes:  Left brain: Electrodes Voltage/Current P.W. Freq. Notes  1-2+ 5.35 100 190 Copied current program to Aug 2025   5.45   Tremor slightly improved   5.55   Better, speech seems OK                                               Final Settings:   Grp L contact mA PW F Limits R contact mA PW F Limits  Feb 2025 1-2+ 5.35 100 190 4.5-5.5 11-C+ 3.0 60 130 3.00-3.00  April 1-2+ 5.35 90 190  11-C+ 3.0 60 130   Aug 2025 1-2+ 5.55 100 190  11-C+ 3.0 60 130               *[employer]   Complications: None

## 2024-01-23 ENCOUNTER — Emergency Department

## 2024-01-23 ENCOUNTER — Observation Stay: Admit: 2024-01-23

## 2024-01-23 ENCOUNTER — Observation Stay (HOSPITAL_BASED_OUTPATIENT_CLINIC_OR_DEPARTMENT_OTHER)
Admit: 2024-01-23 | Discharge: 2024-01-23 | Disposition: A | Discharge status: Home / Self Care | Attending: Internal Medicine | Admitting: Internal Medicine

## 2024-01-23 ENCOUNTER — Observation Stay

## 2024-01-23 ENCOUNTER — Observation Stay: Admission: EM | Admit: 2024-01-23 | Discharge: 2024-01-24 | Disposition: A | Discharge status: Home / Self Care

## 2024-01-23 ENCOUNTER — Other Ambulatory Visit: Payer: Self-pay

## 2024-01-23 DIAGNOSIS — Z8543 Personal history of malignant neoplasm of ovary: Secondary | ICD-10-CM | POA: Diagnosis not present

## 2024-01-23 DIAGNOSIS — F5101 Primary insomnia: Secondary | ICD-10-CM | POA: Diagnosis not present

## 2024-01-23 DIAGNOSIS — Z85118 Personal history of other malignant neoplasm of bronchus and lung: Secondary | ICD-10-CM | POA: Diagnosis not present

## 2024-01-23 DIAGNOSIS — G629 Polyneuropathy, unspecified: Secondary | ICD-10-CM | POA: Diagnosis not present

## 2024-01-23 DIAGNOSIS — S0990XA Unspecified injury of head, initial encounter: Secondary | ICD-10-CM | POA: Insufficient documentation

## 2024-01-23 DIAGNOSIS — Z85820 Personal history of malignant melanoma of skin: Secondary | ICD-10-CM | POA: Diagnosis not present

## 2024-01-23 DIAGNOSIS — G894 Chronic pain syndrome: Secondary | ICD-10-CM | POA: Insufficient documentation

## 2024-01-23 DIAGNOSIS — R55 Syncope and collapse: Secondary | ICD-10-CM

## 2024-01-23 DIAGNOSIS — I779 Disorder of arteries and arterioles, unspecified: Secondary | ICD-10-CM | POA: Diagnosis present

## 2024-01-23 DIAGNOSIS — N183 Chronic kidney disease, stage 3 unspecified: Secondary | ICD-10-CM | POA: Insufficient documentation

## 2024-01-23 DIAGNOSIS — E039 Hypothyroidism, unspecified: Secondary | ICD-10-CM | POA: Diagnosis not present

## 2024-01-23 DIAGNOSIS — I6529 Occlusion and stenosis of unspecified carotid artery: Secondary | ICD-10-CM | POA: Diagnosis not present

## 2024-01-23 DIAGNOSIS — N179 Acute kidney failure, unspecified: Secondary | ICD-10-CM | POA: Diagnosis not present

## 2024-01-23 DIAGNOSIS — R2681 Unsteadiness on feet: Secondary | ICD-10-CM | POA: Insufficient documentation

## 2024-01-23 DIAGNOSIS — Z7982 Long term (current) use of aspirin: Secondary | ICD-10-CM | POA: Insufficient documentation

## 2024-01-23 DIAGNOSIS — G250 Essential tremor: Secondary | ICD-10-CM | POA: Diagnosis not present

## 2024-01-23 DIAGNOSIS — Z23 Encounter for immunization: Secondary | ICD-10-CM | POA: Diagnosis not present

## 2024-01-23 DIAGNOSIS — G2581 Restless legs syndrome: Secondary | ICD-10-CM | POA: Diagnosis not present

## 2024-01-23 DIAGNOSIS — R2689 Other abnormalities of gait and mobility: Secondary | ICD-10-CM | POA: Diagnosis present

## 2024-01-23 DIAGNOSIS — W19XXXA Unspecified fall, initial encounter: Secondary | ICD-10-CM | POA: Insufficient documentation

## 2024-01-23 DIAGNOSIS — G608 Other hereditary and idiopathic neuropathies: Secondary | ICD-10-CM | POA: Diagnosis present

## 2024-01-23 LAB — COMPREHENSIVE METABOLIC PANEL WITH GFR
ALT: 20 U/L (ref 0–44)
AST: 26 U/L (ref 15–41)
Albumin: 4 g/dL (ref 3.5–5.0)
Alkaline Phosphatase: 58 U/L (ref 38–126)
Anion gap: 10 (ref 5–15)
BUN: 32 mg/dL — ABNORMAL HIGH (ref 8–23)
CO2: 28 mmol/L (ref 22–32)
Calcium: 9.2 mg/dL (ref 8.9–10.3)
Chloride: 104 mmol/L (ref 98–111)
Creatinine, Ser: 1.13 mg/dL — ABNORMAL HIGH (ref 0.44–1.00)
GFR, Estimated: 46 mL/min — ABNORMAL LOW (ref >60)
Glucose, Bld: 101 mg/dL — ABNORMAL HIGH (ref 70–99)
Potassium: 4.3 mmol/L (ref 3.5–5.1)
Sodium: 142 mmol/L (ref 135–145)
Total Bilirubin: 0.3 mg/dL (ref 0.0–1.2)
Total Protein: 7.3 g/dL (ref 6.5–8.1)

## 2024-01-23 LAB — LACTIC ACID, PLASMA: Lactic Acid, Venous: 1 mmol/L (ref 0.5–1.9)

## 2024-01-23 LAB — URINALYSIS, ROUTINE W REFLEX MICROSCOPIC
Bilirubin Urine: NEGATIVE
Glucose, UA: NEGATIVE mg/dL
Hgb urine dipstick: NEGATIVE
Ketones, ur: NEGATIVE mg/dL
Leukocytes,Ua: NEGATIVE
Nitrite: NEGATIVE
Protein, ur: NEGATIVE mg/dL
Specific Gravity, Urine: 1.009 (ref 1.005–1.030)
pH: 7 (ref 5.0–8.0)

## 2024-01-23 LAB — GLUCOSE, CAPILLARY: Glucose-Capillary: 137 mg/dL — ABNORMAL HIGH (ref 70–99)

## 2024-01-23 LAB — CBC
HCT: 43.1 % (ref 36.0–46.0)
Hemoglobin: 13.6 g/dL (ref 12.0–15.0)
MCH: 29.6 pg (ref 26.0–34.0)
MCHC: 31.6 g/dL (ref 30.0–36.0)
MCV: 93.7 fL (ref 80.0–100.0)
Platelets: 190 K/uL (ref 150–400)
RBC: 4.6 MIL/uL (ref 3.87–5.11)
RDW: 13.6 % (ref 11.5–15.5)
WBC: 9.2 K/uL (ref 4.0–10.5)
nRBC: 0 % (ref 0.0–0.2)

## 2024-01-23 LAB — PROCALCITONIN: Procalcitonin: <0.1 ng/mL

## 2024-01-23 LAB — TROPONIN I (HIGH SENSITIVITY)
Troponin I (High Sensitivity): 6 ng/L (ref <18)
Troponin I (High Sensitivity): 7 ng/L (ref <18)

## 2024-01-23 LAB — VITAMIN B12: Vitamin B-12: 270 pg/mL (ref 180–914)

## 2024-01-23 MED ORDER — INFLUENZA VAC SPLIT HIGH-DOSE 0.5 ML IM SUSY
0.5000 mL | PREFILLED_SYRINGE | INTRAMUSCULAR | Status: AC
  Administered 2024-01-24: 0.5 mL via INTRAMUSCULAR
  Filled 2024-01-23 (×2): qty 0.5

## 2024-01-23 MED ORDER — PRAMIPEXOLE DIHYDROCHLORIDE 0.25 MG PO TABS
0.2500 mg | ORAL_TABLET | Freq: Every day | ORAL | Status: DC
  Administered 2024-01-23: 0.25 mg via ORAL
  Filled 2024-01-23: qty 1

## 2024-01-23 MED ORDER — VITAMIN D 25 MCG (1000 UNIT) PO TABS
1000.0000 [IU] | ORAL_TABLET | Freq: Every day | ORAL | Status: DC
  Administered 2024-01-24: 1000 [IU] via ORAL
  Filled 2024-01-23: qty 1

## 2024-01-23 MED ORDER — LEVOTHYROXINE SODIUM 88 MCG PO TABS: 88.0000 ug | ORAL_TABLET | ORAL | Status: DC

## 2024-01-23 MED ORDER — LEVOTHYROXINE SODIUM 100 MCG PO TABS
100.0000 ug | ORAL_TABLET | Freq: Every day | ORAL | Status: DC
  Administered 2024-01-24: 100 ug via ORAL
  Filled 2024-01-23: qty 1

## 2024-01-23 MED ORDER — ENOXAPARIN SODIUM 60 MG/0.6ML IJ SOSY
0.5000 mg/kg | PREFILLED_SYRINGE | INTRAMUSCULAR | Status: DC
  Administered 2024-01-23: 45 mg via SUBCUTANEOUS
  Filled 2024-01-23: qty 0.6

## 2024-01-23 MED ORDER — LEVOTHYROXINE SODIUM 88 MCG PO TABS: 44.0000 ug | ORAL_TABLET | ORAL | Status: DC

## 2024-01-23 MED ORDER — LACTATED RINGERS IV SOLN: INTRAVENOUS | Status: DC

## 2024-01-23 MED ORDER — SIMVASTATIN 20 MG PO TABS
40.0000 mg | ORAL_TABLET | Freq: Every day | ORAL | Status: DC
  Administered 2024-01-23: 40 mg via ORAL
  Filled 2024-01-23: qty 2

## 2024-01-23 MED ORDER — HYDRALAZINE HCL 20 MG/ML IJ SOLN: 5.0000 mg | Freq: Four times a day (QID) | INTRAMUSCULAR | Status: DC | PRN

## 2024-01-23 MED ORDER — ACETAMINOPHEN 325 MG PO TABS
650.0000 mg | ORAL_TABLET | Freq: Four times a day (QID) | ORAL | Status: DC | PRN
Start: 2024-01-23 — End: 2024-01-28

## 2024-01-23 MED ORDER — MELATONIN 5 MG PO TABS: 5.0000 mg | ORAL_TABLET | Freq: Every evening | ORAL | Status: DC | PRN

## 2024-01-23 MED ORDER — SODIUM CHLORIDE 0.9 % IV BOLUS
500.0000 mL | Freq: Once | INTRAVENOUS | Status: AC
  Administered 2024-01-23: 500 mL via INTRAVENOUS

## 2024-01-23 MED ORDER — PREGABALIN 75 MG PO CAPS
75.0000 mg | ORAL_CAPSULE | Freq: Three times a day (TID) | ORAL | Status: DC
  Administered 2024-01-24 (×2): 150 mg via ORAL
  Filled 2024-01-23 (×2): qty 2

## 2024-01-23 MED ORDER — SODIUM CHLORIDE 0.9% FLUSH
3.0000 mL | Freq: Two times a day (BID) | INTRAVENOUS | Status: DC
  Administered 2024-01-23 – 2024-01-24 (×2): 3 mL via INTRAVENOUS

## 2024-01-23 MED ORDER — PRIMIDONE 50 MG PO TABS
200.0000 mg | ORAL_TABLET | Freq: Every day | ORAL | Status: DC
  Administered 2024-01-23: 200 mg via ORAL
  Filled 2024-01-23: qty 4

## 2024-01-23 MED ORDER — POTASSIUM CHLORIDE CRYS ER 10 MEQ PO TBCR
10.0000 meq | EXTENDED_RELEASE_TABLET | Freq: Every day | ORAL | Status: DC
  Administered 2024-01-24: 10 meq via ORAL
  Filled 2024-01-23: qty 1

## 2024-01-23 MED ORDER — ASPIRIN 81 MG PO CHEW
81.0000 mg | CHEWABLE_TABLET | Freq: Every day | ORAL | Status: DC
  Administered 2024-01-24: 81 mg via ORAL
  Filled 2024-01-23: qty 1

## 2024-01-23 MED ORDER — ONDANSETRON HCL 4 MG PO TABS
4.0000 mg | ORAL_TABLET | Freq: Four times a day (QID) | ORAL | Status: DC | PRN
Start: 2024-01-23 — End: 2024-01-28

## 2024-01-23 MED ORDER — ACETAMINOPHEN 650 MG RE SUPP: 650.0000 mg | Freq: Four times a day (QID) | RECTAL | Status: DC | PRN

## 2024-01-23 MED ORDER — ONDANSETRON HCL 4 MG/2ML IJ SOLN: 4.0000 mg | Freq: Four times a day (QID) | INTRAMUSCULAR | Status: DC | PRN

## 2024-01-23 NOTE — ED Provider Notes (Signed)
[facility] Provider Note    Event Date/Time   First MD Initiated Contact with Patient 01/23/24 [phone]     (approximate)   History   Fall   HPI  Shelley Mahoney is a 89 y.o. female with a history of CKD, hyperlipidemia, hypothyroidism, vascular disease, lung cancer, and uterine cancer presents with a near syncopal event and subsequent fall.  The patient states that she was pouring milk in her kitchen when she started to feel a funny feeling in her head.  She then felt weak and lightheaded.  She started to let herself down but did fall and believes that she hit her head.  She did not lose consciousness.  She denies any neck, back, or hip pain.  She states that she feels very tired but denies feeling dizzy or lightheaded.  She denies any chest pain or difficulty breathing.  She has no nausea or vomiting.  She has no fever or chills.  I reviewed the past medical records.  The patient's most recent outpatient encounter was on 8/5 with neurology for deep brain stimulation therapy.   Physical Exam   Triage Vital Signs: ED Triage Vitals  Encounter Vitals Group     BP --      Girls Systolic BP Percentile --      Girls Diastolic BP Percentile --      Boys Systolic BP Percentile --      Boys Diastolic BP Percentile --      Pulse Rate 01/23/24 0920 65     Resp 01/23/24 0920 18     Temp 01/23/24 0920 98 F (36.7 C)     Temp src --      SpO2 01/23/24 0920 98 %     Weight 01/23/24 0921 192 lb 12.8 oz (87.5 kg)     Height 01/23/24 0921 5' 7 (1.702 m)     Head Circumference --      Peak Flow --      Pain Score 01/23/24 0921 0     Pain Loc --      Pain Education --      Exclude from Growth Chart --     Most recent vital signs: Vitals:   01/23/24 1300 01/23/24 1330  BP: 116/63 119/78  Pulse: 80 73  Resp: 15 18  Temp:    SpO2: 98% 100%     General: Alert, relatively well-appearing, no distress.  CV:  Good peripheral perfusion.  Resp:  Normal  effort.  Lungs CTAB. Abd:  Soft and nontender.  No distention.  Other:  EOMI.  PERRLA.  No facial droop.  Normal speech.  Motor intact in all extremities.  Somewhat dry mucous membranes.   ED Results / Procedures / Treatments   Labs (all labs ordered are listed, but only abnormal results are displayed) Labs Reviewed  COMPREHENSIVE METABOLIC PANEL WITH GFR - Abnormal; Notable for the following components:      Result Value   Glucose, Bld 101 (*)    BUN 32 (*)    Creatinine, Ser 1.13 (*)    GFR, Estimated 46 (*)    All other components within normal limits  CBC  LACTIC ACID, PLASMA  URINALYSIS, ROUTINE W REFLEX MICROSCOPIC  VITAMIN B12  PROCALCITONIN  CBG MONITORING, ED  TROPONIN I (HIGH SENSITIVITY)  TROPONIN I (HIGH SENSITIVITY)     EKG  ED ECG REPORT I, Waylon Cassis, the attending physician, personally viewed and interpreted this ECG.  Date: 01/23/2024 EKG  Time: 0921 Rate: 68 Rhythm: normal sinus rhythm QRS Axis: normal Intervals: Incomplete LBBB ST/T Wave abnormalities: Nonspecific ST abnormality Narrative Interpretation: no evidence of acute ischemia    RADIOLOGY  CT head/cervical spine: I independently viewed and interpreted the images; there is no ICH.  Radiology report indicates no acute intracranial abnormalities and no acute C-spine fracture.  PROCEDURES:  Critical Care performed: No  Procedures   MEDICATIONS ORDERED IN ED: Medications  sodium chloride  flush (NS) 0.9 % injection 3 mL (3 mLs Intravenous Not Given 01/23/24 1342)  enoxaparin  (LOVENOX ) injection 45 mg (has no administration in time range)  acetaminophen  (TYLENOL ) tablet 650 mg (has no administration in time range)    Or  acetaminophen  (TYLENOL ) suppository 650 mg (has no administration in time range)  ondansetron  (ZOFRAN ) tablet 4 mg (has no administration in time range)    Or  ondansetron  (ZOFRAN ) injection 4 mg (has no administration in time range)  lactated ringers  infusion  (has no administration in time range)  hydrALAZINE  (APRESOLINE ) injection 5 mg (has no administration in time range)  melatonin tablet 5 mg (has no administration in time range)  simvastatin  (ZOCOR ) tablet 40 mg (has no administration in time range)  levothyroxine  (SYNTHROID ) tablet 88 mcg (has no administration in time range)  pramipexole  (MIRAPEX ) tablet 0.25 mg (has no administration in time range)  primidone  (MYSOLINE ) tablet 200 mg (has no administration in time range)  cholecalciferol  (VITAMIN D3) 25 MCG (1000 UNIT) tablet 1,000 Units (has no administration in time range)  levothyroxine  (SYNTHROID ) tablet 44 mcg (has no administration in time range)  Influenza vac split trivalent PF (FLUZONE HIGH-DOSE) injection 0.5 mL (has no administration in time range)  sodium chloride  0.9 % bolus 500 mL (0 mLs Intravenous Stopped 01/23/24 1125)     IMPRESSION / MDM / ASSESSMENT AND PLAN / ED COURSE  I reviewed the triage vital signs and the nursing notes.  89 year old female with PMH as noted above presents with a near syncopal event and subsequent fall and head injury.  The patient now reports feeling very fatigued.  On exam her blood pressure is low.  Other vital signs are normal.  Differential diagnosis includes, but is not limited to, vasovagal near syncope, dehydration/hypovolemia, orthostatic hypotension, electrolyte abnormality, hypoglycemia, other metabolic cause, UTI or other infection, cardiac dysrhythmia, less likely CNS cause.  Patient's presentation is most consistent with acute presentation with potential threat to life or bodily function.  The patient is on the cardiac monitor to evaluate for evidence of arrhythmia and/or significant heart rate changes.  ----------------------------------------- 2:00 PM on 01/23/2024 -----------------------------------------  CT head and cervical spine are negative.  Lactate is normal.  Troponin is negative.  CMP and CBC show no acute findings.   The patient states she is feeling a bit better after fluids and her blood pressure is improved.  Urinalysis is still pending.  Given her age and the unprovoked syncopal episode, she will benefit from admission for further observation and monitoring.  I consulted Dr. Sherre from the hospitalist service; based on our discussion she agrees to evaluate the patient for admission.   FINAL CLINICAL IMPRESSION(S) / ED DIAGNOSES   Final diagnoses:  Near syncope  Minor head injury, initial encounter     Rx / DC Orders   ED Discharge Orders     None        Note:  This document was prepared using Dragon voice recognition software and may include unintentional dictation errors.    Jacolyn Pae, MD  01/23/24 1529  

## 2024-01-23 NOTE — Hospital Course (Signed)
 Ms. Kasie Leccese is a 89 year old female with history of hypothyroid, essential tremor, chronic pain, hyperlipidemia, insomnia, who presents ED for chief concerns of syncope.  Vitals in the ED showed T of 98, rr of 13, hr 58, blood pressure 100/48, SpO2 88% on room air improved to 100% on room air.  Serum sodium is 142, potassium 4.3, chloride 104, bicarb 28, BUN of 32, serum creatinine 1.13, EGFR 46, nonfasting blood glucose 101, WBC 9.2, hemoglobin 13.6, platelets of 190.  HS protein is 6 and a repeat is 7. Lactic acid is 1.0. UA has been ordered and pending collection.  ED treatment: NS 500 mL bolus.

## 2024-01-23 NOTE — Assessment & Plan Note (Addendum)
 Levothyroxine  88 mcg per home dosing resume: take 1 tablet (88 mcg total) by mouth once daily Monday-Saturday. Take HALF tablet (44 mcg total) on Sundays. Take on an empty stomach with a glass of water.

## 2024-01-23 NOTE — Assessment & Plan Note (Addendum)
 Versus increased serum creatinine level, baseline no CKD, serum creatinine is 0.88-1.0 Status post sodium chloride  500 mL liter bolus per EDP Continue with LR infusion, 100 mL/h, 1 day ordered Strict I's and O's, check BMP in a.m.

## 2024-01-23 NOTE — Assessment & Plan Note (Signed)
 -  Melatonin 5 mg nightly as needed for sleep

## 2024-01-23 NOTE — H&P (Addendum)
 History and Physical   Shelley Mahoney FMW:[id number] DOB: 1931-07-19 DOA: 01/23/2024  PCP: Lenon Layman ORN, MD  Patient coming from: [facility] via EMS  I have personally briefly reviewed patient's old medical records in [facility] EMR.  Chief Concern: Syncope  HPI: Ms. Shelley Mahoney is a 89 year old female with history of hypothyroid, essential tremor, chronic pain, hyperlipidemia, insomnia, who presents ED for chief concerns of syncope.  Vitals in the ED showed T of 98, rr of 13, hr 58, blood pressure 100/48, SpO2 88% on room air improved to 100% on room air.  Serum sodium is 142, potassium 4.3, chloride 104, bicarb 28, BUN of 32, serum creatinine 1.13, EGFR 46, nonfasting blood glucose 101, WBC 9.2, hemoglobin 13.6, platelets of 190.  HS protein is 6 and a repeat is 7. Lactic acid is 1.0. UA has been ordered and pending collection.  ED treatment: NS 500 mL bolus. -------------------------------------- At bedside, patient was able to tell me her first and last name, age, location, current calendar year.  She reports that when she got up this morning, she used the restroom and walked into the living room without difficulty.  She then went to the kitchen to pour some milk.  And she developed a sensation of whole body feeling 'funny' in the last thing she knew she tried to sleep down but she ended up hitting her head against the fridge.  She thought she may have lost consciousness.  She denies urinating/defecating on herself.  She reports this is never happened before.  She denies chest pain, shortness of breath, cough, fever, chills, dysphagia, dysuria, hematuria, diarrhea, blood in her stool.  She reports last night she had abdominal discomfort and after a regular bowel movement the abdominal pain went away.  Social history: She lives at [facility] in her own apartment.  She denies tobacco, EtOH, recreational drug use.  She is retired.  ROS: Constitutional: no weight change,  no fever ENT/Mouth: no sore throat, no rhinorrhea Eyes: no eye pain, no vision changes Cardiovascular: no chest pain, no dyspnea,  no edema, no palpitations Respiratory: no cough, no sputum, no wheezing Gastrointestinal: no nausea, no vomiting, no diarrhea, no constipation Genitourinary: no urinary incontinence, no dysuria, no hematuria Musculoskeletal: no arthralgias, no myalgias Skin: no skin lesions, no pruritus, Neuro: + weakness, + loss of consciousness, + syncope Psych: no anxiety, no depression, no decrease appetite Heme/Lymph: no bruising, no bleeding  ED Course: Discussed with EDP, patient requiring hospitalization for chief concerns of syncope.  Assessment/Plan  Principal Problem:   Syncope Active Problems:   Sensory polyneuropathy (lower extremites)   Balance problem   Carotid artery disease (HCC)   Neuropathy   Primary insomnia   Restless legs syndrome   Essential tremor   Chronic pain syndrome   AKI (acute kidney injury) (HCC)   Hypothyroidism   Assessment and Plan:  * Syncope Etiology workup in progress Differentials include infectious etiology including urinary tract infection, pneumonia, dysrhythmia, B12 deficiency, cva UA has been ordered and pending collection Chest x-ray, 2 view ordered, check B12 level Complete echo ordered CT head w/o contrast ordered for 01/24/2024 Fall precaution Admit to telemetry medical, observation  Addendum: Chest x-ray 2 view was read as hazy density, mostly thought to be on the left side but some potentially in the right, retrodiaphragmatic atelectasis or pneumonia not excluded, aspiration pneumonia is likewise a differential diagnosis consideration.  Check stat procalcitonin.  Hypothyroidism Levothyroxine  88 mcg per home dosing resume: take 1 tablet (  88 mcg total) by mouth once daily Monday-Saturday. Take HALF tablet (44 mcg total) on Sundays. Take on an empty stomach with a glass of water.   AKI (acute kidney injury)  (HCC) Versus increased serum creatinine level, baseline no CKD, serum creatinine is 0.88-1.0 Status post sodium chloride  500 mL liter bolus per EDP Continue with LR infusion, 100 mL/h, 1 day ordered Strict I's and O's, check BMP in a.m.  Chronic pain syndrome PDMP reviewed Patient currently with active prescription for pregabalin  75 mg capsule, 180 capsules, for 50-day supply written and filled on 12/25/2023  Essential tremor Home primidone  200 mg nightly, pramipexole  0.25 mg nightly resumed  Primary insomnia Melatonin 5 mg nightly as needed for sleep  Chart reviewed.   DVT prophylaxis: Enoxaparin  subcutaneous Code Status: dnr/dni  Diet: Regular diet Family Communication: updated good friend, Jan at bedside with patient's permission  Disposition Plan: pending clinical course  Consults called: none at this time Admission status: telemetry med, obs   Past Medical History:  Diagnosis Date   Carotid stenosis    Carotid stenosis    Chronic kidney disease    Stage 3, GFR30-59 ml-min   CKD (chronic kidney disease)    Dysplastic nevus 09/22/2019   Right upper forehead. Severe atypia.   Hypothyroidism    Lung cancer (HCC) 2018   Melanoma (HCC) 08/08/2022   L post upper arm, B[place] 0.4 mm, level III, WLE 08/23/22 [place], [place] testing Class 1A   Neuro-degenerative disorders (HCC)    Personal history of radiation therapy    Uterine cancer (HCC)    ovarian   Past Surgical History:  Procedure Laterality Date   ABDOMINAL HYSTERECTOMY     Total-does not have ovaries   COLONOSCOPY WITH PROPOFOL  N/A 12/08/2014   Procedure: COLONOSCOPY WITH PROPOFOL ;  Surgeon: Gladis RAYMOND Mariner, MD;  [facility] ENDOSCOPY;  Service: Endoscopy;  Laterality: N/A;   EYE SURGERY     cataracts ou   FLEXIBLE BRONCHOSCOPY N/A 04/21/2016   Procedure: FLEXIBLE BRONCHOSCOPY;  Surgeon: Alm KATHEE Nett, MD;  Location: [facility] ORS;  Service: Pulmonary;  Laterality: N/A;   THYROID SURGERY     Social  History:  reports that she has never smoked. She has never used smokeless tobacco. She reports that she does not drink alcohol and does not use drugs.  No Known Allergies Family History  Problem Relation Age of Onset   Breast cancer Mother    Other Father        erythrocytosis    Colon cancer Maternal Grandmother    Family history: Family history reviewed and not pertinent.  Prior to Admission medications   Medication Sig Start Date End Date Taking? Authorizing Provider  ASPIRIN  81 PO Take 81 mg by mouth daily. 11/11/09   [provider]  cholecalciferol  (VITAMIN D ) 1000 units tablet Take 1,000 Units by mouth daily.    [provider]  gabapentin (NEURONTIN) 300 MG capsule Take 300 mg by mouth 3 (three) times daily. 09/20/22   [provider]  IRON CR PO Take 45 mg by mouth daily.    [provider]  levothyroxine  (SYNTHROID ) 88 MCG tablet Take 1 tablet (88 mcg total) by mouth once daily Monday-Saturday. Take HALF tablet (44 mcg total) on Sundays. Take on an empty stomach with a glass of water. 01/12/22   [provider]  MAGNESIUM GLYCINATE PLUS PO Take 400 mg by mouth 2 (two) times daily.     [provider]  Multiple Vitamins-Minerals (PRESERVISION  AREDS) CAPS Take by mouth.    [provider]  pramipexole  (MIRAPEX ) 0.125 MG tablet Take 0.25 mg by mouth at bedtime.     [provider]  primidone  (MYSOLINE ) 50 MG tablet Take 200 mg by mouth at bedtime.    [provider]  Probiotic Product (PHILLIPS COLON HEALTH PO) Take 1 tablet by mouth daily.     [provider]  simvastatin  (ZOCOR ) 40 MG tablet Take 40 mg by mouth at bedtime.     [provider]  torsemide  (DEMADEX ) 10 MG tablet Take 10 mg by mouth daily. 11/14/22 11/14/23  [provider]   Physical Exam: Vitals:   01/23/24 1200 01/23/24 1230 01/23/24 1300 01/23/24 1330  BP: (!) 102/54 (!) 105/55 116/63 119/78  Pulse: (!) 58 70  80 73  Resp: 13 14 15 18   Temp:      SpO2: 99% 100% 98% 100%  Weight:      Height:       Constitutional: appears age-appropriate, frail, NAD, calm Eyes: PERRL, lids and conjunctivae normal ENMT: Mucous membranes are moist. Posterior pharynx clear of any exudate or lesions. Age-appropriate dentition. Hearing appropriate Neck: normal, supple, no masses, no thyromegaly Respiratory: clear to auscultation bilaterally, no wheezing, no crackles. Normal respiratory effort. No accessory muscle use.  Cardiovascular: Regular rate and rhythm, no murmurs / rubs / gallops. No extremity edema. 2+ pedal pulses. No carotid bruits.  Abdomen: obese abdomen, no tenderness, no masses palpated, no hepatosplenomegaly. Bowel sounds positive.  Musculoskeletal: no clubbing / cyanosis. No joint deformity upper and lower extremities. Good ROM, no contractures, no atrophy. Normal muscle tone.  Skin: no rashes, lesions, ulcers. No induration Neurologic: Sensation intact. Strength 5/5 in all 4.  Psychiatric: Normal judgment and insight. Alert and oriented x 3. Normal mood.   EKG: independently reviewed, showing sinus rhythm with rate of 68, QTc 427  Chest x-ray on Admission: I personally reviewed and I agree with radiologist reading as below.  X-ray chest PA and lateral Result Date: 01/23/2024 CLINICAL DATA:  Syncope EXAM: CHEST - 2 VIEW COMPARISON:  12/01/2022 FINDINGS: Deep brain stimulator noted with leads extending cephalad in the left neck. Left mid lung scarring along with some hazy densities in the retrodiaphragmatic region, mostly thought to be on the left side but some potentially on the right. Retrodiaphragmatic atelectasis or pneumonia not excluded, correlate with breath sounds. Aspiration pneumonitis is likewise a differential diagnostic consideration. Otherwise the lungs appear clear. Atherosclerotic calcification of the aortic arch. Heart size within normal limits. Degenerative glenohumeral spurring  bilaterally. Mild thoracic spondylosis. IMPRESSION: 1. Hazy densities in the retrodiaphragmatic region, mostly thought to be on the left side but some potentially on the right. Retrodiaphragmatic atelectasis or pneumonia not excluded, correlate with breath sounds. Aspiration pneumonitis is likewise a differential diagnostic consideration. 2. Aortic Atherosclerosis (ICD10-I70.0). 3. Degenerative glenohumeral spurring bilaterally. Electronically Signed   By: Ryan Salvage M.D.   On: 01/23/2024 14:28   CT Cervical Spine Wo Contrast Result Date: 01/23/2024 EXAM: CT CERVICAL SPINE WITHOUT CONTRAST 01/23/2024 10:21:21 AM TECHNIQUE: CT of the cervical spine was performed without the administration of intravenous contrast. Multiplanar reformatted images are provided for review. Automated exposure control, iterative reconstruction, and/or weight based adjustment of the mA/kV was utilized to reduce the radiation dose to as low as reasonably achievable. COMPARISON: None available. CLINICAL HISTORY: Neck trauma (Age >= 65y). Pt in via [facility] from [facility] for a fall at 0730 this morning. Pt states she fell  and did hit head on the fridge. Pt states she stood up and her head felt funny and she slowly slid to the floor. Pt does endorse hitting her head on the fridge. Pt states after she fell she felt tired. Pt denies any blood thinners. Pts speech noted to be slurred. Per pt this is her baseline. BP low at bedside. Per pt that is her baseline. Pt c/o feeling very sleepy. FINDINGS: CERVICAL SPINE: BONES AND ALIGNMENT: Trace anterolisthesis of C4 on C5 and C5 on C6. No facet subluxation or dislocation. Chronic deformity of the T2 superior endplate is similar to prior chest CT from 2024. DEGENERATIVE CHANGES: Osteoarthrosis and uncovertebral hypertrophy at multiple levels. Foraminal stenosis at multiple levels, most pronounced on the left at C3-4. No high-grade osseous spinal canal stenosis. SOFT TISSUES:  No prevertebral soft tissue swelling. IMPRESSION: 1. No acute abnormality of the cervical spine related to the reported neck trauma. Electronically signed by: Donnice Mania MD 01/23/2024 10:45 AM EDT RP Workstation: [id number]   CT Head Wo Contrast Result Date: 01/23/2024 EXAM: CT HEAD WITHOUT CONTRAST 01/23/2024 10:21:21 AM TECHNIQUE: CT of the head was performed without the administration of intravenous contrast. Automated exposure control, iterative reconstruction, and/or weight based adjustment of the mA/kV was utilized to reduce the radiation dose to as low as reasonably achievable. COMPARISON: CT head 01/27/2005 CLINICAL HISTORY: Syncope/presyncope, cerebrovascular cause suspected. Pt in via [facility] from [facility] for a fall at 0730 this morning. Pt states she fell and did hit head on the fridge. Pt states she stood up and her head felt funny and she slowly slid to the floor. Pt does endorse hitting her head on the fridge. Pt states after she fell she felt tired. Pt denies any blood thinners. Pts speech noted to be slurred. Per pt this is her baseline. BP low at bedside. Per pt that is her baseline. Pt c/o feeling very sleepy. FINDINGS: BRAIN AND VENTRICLES: No acute hemorrhage. No evidence of acute infarct. No hydrocephalus. No extra-axial collection. No mass effect or midline shift. Nonspecific hypoattenuation in the periventricular and subcortical white matter, most likely representing chronic small vessel disease. Bilateral deep brain stimulation leads which terminate within the thalami. There is mild encephalomalacia along the course of the left frontal approach deep brain stimulation lead. Streak artifact from extracranial portion of the stimulation leads which slightly limits evaluation of the left temporal and occipital lobes. ORBITS: Bilateral lens replacement. Orbits are otherwise unremarkable. SINUSES: Focal mucosal thickening in the left sphenoid sinus and in the alveolar  recess of the left maxillary sinus. SOFT TISSUES AND SKULL: No acute soft tissue abnormality. No skull fracture. IMPRESSION: 1. No acute intracranial abnormality. 2. Bilateral deep brain stimulation leads terminating within the thalami, with mild encephalomalacia along the course of the left frontal approach lead. Electronically signed by: Donnice Mania MD 01/23/2024 10:36 AM EDT RP Workstation: [id number]   Labs on Admission: I have personally reviewed following labs  CBC: Recent Labs  Lab 01/23/24 0926  WBC 9.2  HGB 13.6  HCT 43.1  MCV 93.7  PLT 190   Basic Metabolic Panel: Recent Labs  Lab 01/23/24 0926  NA 142  K 4.3  CL 104  CO2 28  GLUCOSE 101*  BUN 32*  CREATININE 1.13*  CALCIUM 9.2   GFR: Estimated Creatinine Clearance: 37.6 mL/min (A) (by C-G formula based on SCr of 1.13 mg/dL (H)).  Liver Function Tests: Recent Labs  Lab 01/23/24 0926  AST 26  ALT 20  ALKPHOS 58  BILITOT 0.3  PROT 7.3  ALBUMIN 4.0   This document was prepared using Dragon Voice Recognition software and may include unintentional dictation errors.  Dr. Sherre [facility]  If 7PM-7AM, please contact overnight-coverage provider If 7AM-7PM, please contact day attending provider [email or web address]  01/23/2024, 2:53 PM

## 2024-01-23 NOTE — ED Notes (Signed)
 Assumed care of pt

## 2024-01-23 NOTE — ED Triage Notes (Signed)
 Pt in via [facility] from [facility] for a fall at 0730 this morning. Pt states she fell and did hit head on the fridge. Pt states she stood up and her head felt funny and she slowly slid to the floor. Pt does endorse hitting her head on the fridge. Pt states after she fell she felt tired. Pt denies any blood thinners. Pts speech noted to be slurred. Per pt this is her baseline. BP low at bedside. Per pt that is her baseline. Pt c/o feeling very sleepy.   EMS Vitals: BS-90 HR-67 T-98.1 BP-106/51

## 2024-01-23 NOTE — Assessment & Plan Note (Addendum)
 Etiology workup in progress Differentials include infectious etiology including urinary tract infection, pneumonia, dysrhythmia, B12 deficiency, cva UA has been ordered and pending collection Chest x-ray, 2 view ordered, check B12 level Complete echo ordered CT head w/o contrast ordered for 01/24/2024 Fall precaution Admit to telemetry medical, observation  Addendum: Chest x-ray 2 view was read as hazy density, mostly thought to be on the left side but some potentially in the right, retrodiaphragmatic atelectasis or pneumonia not excluded, aspiration pneumonia is likewise a differential diagnosis consideration.  Check stat procalcitonin.

## 2024-01-23 NOTE — Progress Notes (Signed)
 PHARMACIST - PHYSICIAN COMMUNICATION  CONCERNING:  Enoxaparin  (Lovenox ) for DVT Prophylaxis    RECOMMENDATION: Patient was prescribed enoxaprin 40mg  q24 hours for VTE prophylaxis.   Filed Weights   01/23/24 0921  Weight: 87.5 kg (192 lb 12.8 oz)    Body mass index is 30.2 kg/m.  Estimated Creatinine Clearance: 37.6 mL/min (A) (by C-G formula based on SCr of 1.13 mg/dL (H)).   Based on [facility] policy patient is candidate for enoxaparin  0.5mg /kg TBW SQ every 24 hours based on BMI being >30.  DESCRIPTION: Pharmacy has adjusted enoxaparin  dose per [facility] policy.  Patient is now receiving enoxaparin  45 mg every 24 hours    Damien Napoleon, PharmD Clinical Pharmacist  01/23/2024 1:39 PM

## 2024-01-23 NOTE — Assessment & Plan Note (Signed)
 Home primidone  200 mg nightly, pramipexole  0.25 mg nightly resumed

## 2024-01-23 NOTE — Plan of Care (Signed)
  Problem: Education: Goal: Knowledge of condition and prescribed therapy will improve Outcome: Not Progressing   Problem: Cardiac: Goal: Will achieve and/or maintain adequate cardiac output Outcome: Not Progressing   Problem: Physical Regulation: Goal: Complications related to the disease process, condition or treatment will be avoided or minimized Outcome: Not Progressing   Problem: Education: Goal: Knowledge of General Education information will improve Description: Including pain rating scale, medication(s)/side effects and non-pharmacologic comfort measures Outcome: Not Progressing   Problem: Health Behavior/Discharge Planning: Goal: Ability to manage health-related needs will improve Outcome: Not Progressing   Problem: Clinical Measurements: Goal: Ability to maintain clinical measurements within normal limits will improve Outcome: Not Progressing Goal: Will remain free from infection Outcome: Not Progressing Goal: Diagnostic test results will improve Outcome: Not Progressing Goal: Respiratory complications will improve Outcome: Not Progressing Goal: Cardiovascular complication will be avoided Outcome: Not Progressing   Problem: Activity: Goal: Risk for activity intolerance will decrease Outcome: Not Progressing   Problem: Nutrition: Goal: Adequate nutrition will be maintained Outcome: Not Progressing   Problem: Coping: Goal: Level of anxiety will decrease Outcome: Not Progressing   Problem: Elimination: Goal: Will not experience complications related to bowel motility Outcome: Not Progressing Goal: Will not experience complications related to urinary retention Outcome: Not Progressing   Problem: Pain Managment: Goal: General experience of comfort will improve and/or be controlled Outcome: Not Progressing   Problem: Safety: Goal: Ability to remain free from injury will improve Outcome: Not Progressing   Problem: Skin Integrity: Goal: Risk for impaired  skin integrity will decrease Outcome: Not Progressing   Problem: Education: Goal: Knowledge of General Education information will improve Description: Including pain rating scale, medication(s)/side effects and non-pharmacologic comfort measures Outcome: Not Progressing

## 2024-01-23 NOTE — Assessment & Plan Note (Signed)
 PDMP reviewed Patient currently with active prescription for pregabalin  75 mg capsule, 180 capsules, for 50-day supply written and filled on 12/25/2023

## 2024-01-24 ENCOUNTER — Observation Stay

## 2024-01-24 DIAGNOSIS — R55 Syncope and collapse: Secondary | ICD-10-CM | POA: Diagnosis not present

## 2024-01-24 LAB — CBC
HCT: 38.4 % (ref 36.0–46.0)
Hemoglobin: 12.3 g/dL (ref 12.0–15.0)
MCH: 29.7 pg (ref 26.0–34.0)
MCHC: 32 g/dL (ref 30.0–36.0)
MCV: 92.8 fL (ref 80.0–100.0)
Platelets: 172 K/uL (ref 150–400)
RBC: 4.14 MIL/uL (ref 3.87–5.11)
RDW: 13.7 % (ref 11.5–15.5)
WBC: 7.9 K/uL (ref 4.0–10.5)
nRBC: 0 % (ref 0.0–0.2)

## 2024-01-24 LAB — BASIC METABOLIC PANEL WITH GFR
Anion gap: 7 (ref 5–15)
BUN: 22 mg/dL (ref 8–23)
CO2: 25 mmol/L (ref 22–32)
Calcium: 8.2 mg/dL — ABNORMAL LOW (ref 8.9–10.3)
Chloride: 108 mmol/L (ref 98–111)
Creatinine, Ser: 0.89 mg/dL (ref 0.44–1.00)
GFR, Estimated: >60 mL/min (ref >60)
Glucose, Bld: 77 mg/dL (ref 70–99)
Potassium: 4 mmol/L (ref 3.5–5.1)
Sodium: 140 mmol/L (ref 135–145)

## 2024-01-24 LAB — GLUCOSE, CAPILLARY: Glucose-Capillary: 82 mg/dL (ref 70–99)

## 2024-01-24 LAB — ECHOCARDIOGRAM COMPLETE
Area-P 1/2: 3.21 cm2
Height: 67 in
S' Lateral: 3.1 cm
Weight: 3084.8 [oz_av]

## 2024-01-24 MED ORDER — TORSEMIDE 10 MG PO TABS: 10.0000 mg | ORAL_TABLET | Freq: Every day | ORAL | Status: AC | PRN

## 2024-01-24 MED ORDER — ENOXAPARIN SODIUM 40 MG/0.4ML IJ SOSY: 40.0000 mg | PREFILLED_SYRINGE | INTRAMUSCULAR | Status: DC

## 2024-01-24 MED ORDER — CYANOCOBALAMIN 1000 MCG PO TABS
1000.0000 ug | ORAL_TABLET | Freq: Every day | ORAL | 1 refills | Status: AC
Start: 2024-01-25 — End: ?

## 2024-01-24 MED ORDER — PREGABALIN 75 MG PO CAPS: 150.0000 mg | ORAL_CAPSULE | Freq: Every day | ORAL | Status: DC

## 2024-01-24 MED ORDER — VITAMIN B-12 1000 MCG PO TABS
1000.0000 ug | ORAL_TABLET | Freq: Every day | ORAL | Status: DC
  Administered 2024-01-24: 1000 ug via ORAL
  Filled 2024-01-24: qty 1

## 2024-01-24 MED ORDER — PREGABALIN 75 MG PO CAPS: 75.0000 mg | ORAL_CAPSULE | Freq: Two times a day (BID) | ORAL | Status: DC | PRN

## 2024-01-24 MED ORDER — PREGABALIN 75 MG PO CAPS: 75.0000 mg | ORAL_CAPSULE | Freq: Two times a day (BID) | ORAL | Status: DC

## 2024-01-24 NOTE — Evaluation (Signed)
 Physical Therapy Evaluation Patient Details Name: Shelley Mahoney MRN: [id number] DOB: [birth year] Today's Date: 01/24/2024  History of Present Illness  Pt is a 89 y/o F presenting to ED after sustaining a fall with syncope at home. CT head and neck negative. PMH significant for CKD, HLD, hypothyroidism, vascular disease, lung cancer, uterine cancer, essential tremor with deep brain stimulator.   Clinical Impression  Pt A&Ox4, agreeable to participate in PT evaluation, denied pain throughout session. At baseline, pt is an active community ambulator with walking stick or RW, able to perform basic ADLs independently. Pt has history of 2 falls in the last 6 months, including the one associated with this admission. Pt was met seated in recliner, STS completed with RW and supervision. Pt demonstrated steady static standing balance, able to perform ADLs at sink in room with supervision. Pt amb ~175 ft total, RW used initially but progressed to no AD. No LOB and minimal postural sway noted throughout. Pt was left seated in recliner, all needs within reach. Pt appears to be near her baseline level of function, but would benefit from acute follow-up to ensure safe return to PLOF.         If plan is discharge home, recommend the following: Assist for transportation;A little help with walking and/or transfers;Assistance with cooking/housework   Can travel by private vehicle        Equipment Recommendations None recommended by PT (pt has all recommended equipment at home)  Recommendations for Other Services       Functional Status Assessment Patient has had a recent decline in their functional status and demonstrates the ability to make significant improvements in function in a reasonable and predictable amount of time.     Precautions / Restrictions Precautions Precautions: Fall Recall of Precautions/Restrictions: Intact Restrictions Weight Bearing Restrictions Per Provider Order: No       Mobility  Bed Mobility               General bed mobility comments: NT this session, pt in recliner at start/end of session.    Transfers Overall transfer level: Needs assistance Equipment used: Rolling walker (2 wheels) Transfers: Sit to/from Stand Sit to Stand: Supervision           General transfer comment: STS from recliner with supervision, cues to push from arm rests of recliner    Ambulation/Gait Ambulation/Gait assistance: Contact guard assist Gait Distance (Feet): 175 Feet Assistive device: Rolling walker (2 wheels), None Gait Pattern/deviations: Step-through pattern, Narrow base of support, Trunk flexed Gait velocity: decreased     General Gait Details: initially amb with RW, progressed to no AD, no LOB, decreased cadence, minimal arm swing bilaterally  Stairs            Wheelchair Mobility     Tilt Bed    Modified Rankin (Stroke Patients Only)       Balance Overall balance assessment: Needs assistance, History of Falls Sitting-balance support: Feet supported Sitting balance-Leahy Scale: Good     Standing balance support: No upper extremity supported, During functional activity Standing balance-Leahy Scale: Good Standing balance comment: able to stand at sink to wash hands/face and brush teeth with steady balance, minimal sway                             Pertinent Vitals/Pain Pain Assessment Pain Assessment: No/denies pain    Home Living Family/patient expects to be discharged to:: Private residence Living Arrangements: Alone  Available Help at Discharge: Friend(s);Available PRN/intermittently Type of Home: Apartment Home Access: Stairs to enter Entrance Stairs-Rails: None Entrance Stairs-Number of Steps: 1 clearance   Home Layout: One level Home Equipment: Cane - single point (walking stick) Additional Comments: Pt lives at [facility] independent living    Prior Function Prior Level of Function :  Independent/Modified Independent             Mobility Comments: Pt reports being amb with walking stick during the day, RW for when she goes to bathroom at night. 1 previous fall in May ADLs Comments: Pt reports being IND with basic ADLs     Extremity/Trunk Assessment   Upper Extremity Assessment Upper Extremity Assessment: Defer to OT evaluation    Lower Extremity Assessment Lower Extremity Assessment: Generalized weakness (hip flexion 4-/5, knee extension 5/5, ankle DF/PF 5/5)       Communication   Communication Communication: No apparent difficulties    Cognition Arousal: Alert Behavior During Therapy: WFL for tasks assessed/performed   PT - Cognitive impairments: No apparent impairments                       PT - Cognition Comments: A&Ox4 Following commands: Intact       Cueing Cueing Techniques: Verbal cues, Visual cues     General Comments      Exercises     Assessment/Plan    PT Assessment Patient needs continued PT services  PT Problem List Decreased mobility;Decreased strength       PT Treatment Interventions DME instruction;Gait training;Functional mobility training;Therapeutic activities;Therapeutic exercise;Balance training;Neuromuscular re-education;Patient/family education    PT Goals (Current goals can be found in the Care Plan section)  Acute Rehab PT Goals Patient Stated Goal: to go home PT Goal Formulation: With patient Time For Goal Achievement: 02/07/24 Potential to Achieve Goals: Good    Frequency Min 2X/week     Co-evaluation               AM-PAC PT 6 Clicks Mobility  Outcome Measure Help needed turning from your back to your side while in a flat bed without using bedrails?: None Help needed moving from lying on your back to sitting on the side of a flat bed without using bedrails?: A Little Help needed moving to and from a bed to a chair (including a wheelchair)?: None Help needed standing up from a chair  using your arms (e.g., wheelchair or bedside chair)?: None Help needed to walk in hospital room?: A Little Help needed climbing 3-5 steps with a railing? : A Little 6 Click Score: 21    End of Session Equipment Utilized During Treatment: Gait belt Activity Tolerance: Patient tolerated treatment well Patient left: in chair;with call bell/phone within reach Nurse Communication: Mobility status PT Visit Diagnosis: Other abnormalities of gait and mobility (R26.89);Muscle weakness (generalized) (M62.81);History of falling (Z91.81)    Time: 9074-9051 PT Time Calculation (min) (ACUTE ONLY): 23 min   Charges:   PT Evaluation $PT Eval Low Complexity: 1 Low PT Treatments $Therapeutic Activity: 8-22 mins PT General Charges $$ ACUTE PT VISIT: 1 Visit         Janell Axe, SPT

## 2024-01-24 NOTE — Plan of Care (Signed)
  Problem: Education: Goal: Knowledge of condition and prescribed therapy will improve 01/24/2024 1542 by Joesph Donnell LABOR, RN Outcome: Progressing 01/24/2024 1542 by Joesph Donnell LABOR, RN Outcome: Progressing   Problem: Cardiac: Goal: Will achieve and/or maintain adequate cardiac output 01/24/2024 1542 by Joesph Donnell LABOR, RN Outcome: Progressing 01/24/2024 1542 by Joesph Donnell LABOR, RN Outcome: Progressing   Problem: Physical Regulation: Goal: Complications related to the disease process, condition or treatment will be avoided or minimized 01/24/2024 1542 by Joesph Donnell LABOR, RN Outcome: Progressing 01/24/2024 1542 by Joesph Donnell LABOR, RN Outcome: Progressing   Problem: Education: Goal: Knowledge of General Education information will improve Description: Including pain rating scale, medication(s)/side effects and non-pharmacologic comfort measures 01/24/2024 1542 by Joesph Donnell LABOR, RN Outcome: Progressing 01/24/2024 1542 by Joesph Donnell LABOR, RN Outcome: Progressing   Problem: Health Behavior/Discharge Planning: Goal: Ability to manage health-related needs will improve 01/24/2024 1542 by Joesph Donnell LABOR, RN Outcome: Progressing 01/24/2024 1542 by Joesph Donnell LABOR, RN Outcome: Progressing   Problem: Clinical Measurements: Goal: Ability to maintain clinical measurements within normal limits will improve 01/24/2024 1542 by Joesph Donnell LABOR, RN Outcome: Progressing 01/24/2024 1542 by Joesph Donnell LABOR, RN Outcome: Progressing Goal: Will remain free from infection 01/24/2024 1542 by Joesph Donnell LABOR, RN Outcome: Progressing 01/24/2024 1542 by Joesph Donnell LABOR, RN Outcome: Progressing Goal: Diagnostic test results will improve 01/24/2024 1542 by Joesph Donnell LABOR, RN Outcome: Progressing 01/24/2024 1542 by Joesph Donnell LABOR, RN Outcome: Progressing Goal: Respiratory complications will improve 01/24/2024 1542 by Joesph Donnell LABOR, RN Outcome: Progressing 01/24/2024 1542 by  Joesph Donnell LABOR, RN Outcome: Progressing Goal: Cardiovascular complication will be avoided 01/24/2024 1542 by Joesph Donnell LABOR, RN Outcome: Progressing 01/24/2024 1542 by Joesph Donnell LABOR, RN Outcome: Progressing   Problem: Activity: Goal: Risk for activity intolerance will decrease 01/24/2024 1542 by Joesph Donnell LABOR, RN Outcome: Progressing 01/24/2024 1542 by Joesph Donnell LABOR, RN Outcome: Progressing   Problem: Nutrition: Goal: Adequate nutrition will be maintained 01/24/2024 1542 by Joesph Donnell LABOR, RN Outcome: Progressing 01/24/2024 1542 by Joesph Donnell LABOR, RN Outcome: Progressing   Problem: Coping: Goal: Level of anxiety will decrease 01/24/2024 1542 by Joesph Donnell LABOR, RN Outcome: Progressing 01/24/2024 1542 by Joesph Donnell LABOR, RN Outcome: Progressing   Problem: Elimination: Goal: Will not experience complications related to bowel motility 01/24/2024 1542 by Joesph Donnell LABOR, RN Outcome: Progressing 01/24/2024 1542 by Joesph Donnell LABOR, RN Outcome: Progressing Goal: Will not experience complications related to urinary retention 01/24/2024 1542 by Joesph Donnell LABOR, RN Outcome: Progressing 01/24/2024 1542 by Joesph Donnell LABOR, RN Outcome: Progressing   Problem: Pain Managment: Goal: General experience of comfort will improve and/or be controlled 01/24/2024 1542 by Joesph Donnell LABOR, RN Outcome: Progressing 01/24/2024 1542 by Joesph Donnell LABOR, RN Outcome: Progressing   Problem: Safety: Goal: Ability to remain free from injury will improve 01/24/2024 1542 by Joesph Donnell LABOR, RN Outcome: Progressing 01/24/2024 1542 by Joesph Donnell LABOR, RN Outcome: Progressing   Problem: Skin Integrity: Goal: Risk for impaired skin integrity will decrease 01/24/2024 1542 by Joesph Donnell LABOR, RN Outcome: Progressing 01/24/2024 1542 by Joesph Donnell LABOR, RN Outcome: Progressing   Problem: Education: Goal: Knowledge of General Education information will  improve Description: Including pain rating scale, medication(s)/side effects and non-pharmacologic comfort measures 01/24/2024 1542 by Joesph Donnell LABOR, RN Outcome: Progressing 01/24/2024 1542 by Joesph Donnell LABOR, RN Outcome: Progressing

## 2024-01-24 NOTE — Discharge Summary (Addendum)
 Physician Discharge Summary   Patient: Shelley Mahoney MRN: [id number] DOB: 06/30/1931  Admit date:     01/23/2024  Discharge date: 01/24/24  Discharge Physician: Laree Lock   PCP: Lenon Layman ORN, MD   Recommendations at discharge:   Follow-up with PCP within 1 week -repeat BMP  Discharge Diagnoses: Principal Problem:   Syncope Active Problems:   Sensory polyneuropathy (lower extremites)   Balance problem   Carotid artery disease (HCC)   Neuropathy   Primary insomnia   Restless legs syndrome   Essential tremor   Chronic pain syndrome   AKI (acute kidney injury) (HCC)   Hypothyroidism  Resolved Problems:   * No resolved hospital problems. [facility] Course: Shelley Mahoney is a 89 year old female with history of hypothyroid, essential tremor, chronic pain, hyperlipidemia, insomnia, who presents ED for chief concerns of near-syncope, admitted for further work up. Hospital course as below  Assessment and Plan:  Near Syncope - Reports developed a sensation of whole body feeling 'funny' and states she slipped to the ground, denies LOC but hit her head to the fridge. CT head and c-spine negative. Denies bowel/bladder incontinence, focal weakness, focal numbness, slurry speech - No leukocytosis, LA not elevated, procalcitonin not elevated, UA negative. CXR  hazy density, mostly thought to be on the left side but some potentially in the right, retrodiaphragmatic atelectasis or pneumonia - No clinical evidence of pna - Telemetry with occasional PVCs, Orthostatic vitals after IV fluids negative, Cardiac etiology unlikely - Echo with EF 60 to 65%, no RWMA.  G1 DD.  Mild MR, trivial AR. - CT head/C-spine negative, no evidence of infarct - B12 low normal - start daily supplements - Suspect patient had vasovagal vs near syncope due to dehydration being on torsemide , also presented with elevated creatinine. Change Torsemide  to prn for leg swelling - PT eval - no recs,  advised to return to ER if patient has an episode of syncope   AKI (acute kidney injury) (HCC) 1.13 -> 0.89 Resolved s/p IV fluids Change torsemide  to as needed only, discontinue potassium supplements Follow-up with PCP to repeat BMP in 1 week   Consultants: None Procedures performed: None  Disposition: Home Diet recommendation:  Discharge Diet Orders (From admission, onward)     Start     Ordered   01/24/24 0000  Diet - low sodium heart healthy        01/24/24 1503            DISCHARGE MEDICATION: Allergies as of 01/24/2024   No Known Allergies      Medication List     STOP taking these medications    gabapentin 300 MG capsule Commonly known as: NEURONTIN   potassium chloride  10 MEQ tablet Commonly known as: KLOR-CON        TAKE these medications    ASPIRIN  81 PO Take 81 mg by mouth daily.   cholecalciferol  1000 units tablet Commonly known as: VITAMIN D  Take 1,000 Units by mouth daily.   cyanocobalamin  1000 MCG tablet Take 1 tablet (1,000 mcg total) by mouth daily. Start taking on: January 25, 2024   IRON CR PO Take 45 mg by mouth daily.   levothyroxine  100 MCG tablet Commonly known as: SYNTHROID  Take 100 mcg by mouth every morning.   MAGNESIUM GLYCINATE PLUS PO Take 400 mg by mouth 2 (two) times daily.   PHILLIPS COLON HEALTH PO Take 1 tablet by mouth daily.   pramipexole  0.125 MG tablet Commonly known as: MIRAPEX  Take  0.25 mg by mouth at bedtime.   pregabalin  75 MG capsule Commonly known as: LYRICA  1 caps po qnoon, 1 caps at dinner and 2 caps at bedtime. If needed, you can increase all 3 doses to 2 pills   PreserVision AREDS Caps Take by mouth.   primidone  50 MG tablet Commonly known as: MYSOLINE  Take 200 mg by mouth at bedtime.   simvastatin  40 MG tablet Commonly known as: ZOCOR  Take 40 mg by mouth at bedtime.   torsemide  10 MG tablet Commonly known as: DEMADEX  Take 1 tablet (10 mg total) by mouth daily as needed (leg  swelling). What changed:  when to take this reasons to take this        Follow-up Information     Lenon Layman ORN, MD Follow up.   Specialty: Internal Medicine Why: hospital follow up Contact information: [address] [place] [address] [phone]                Discharge Exam: [name]   01/23/24 0921 01/24/24 0500  Weight: 87.5 kg 85.7 kg    Constitutional: appears age-appropriate, frail, NAD, calm Neck: normal, supple, no masses, no thyromegaly Respiratory: clear to auscultation bilaterally, no wheezing, no crackles. Normal respiratory effort. No accessory muscle use.  Cardiovascular: Regular rate and rhythm, no murmurs / rubs / gallops. No extremity edema. 2+ pedal pulses. No carotid bruits.  Abdomen: obese abdomen, no tenderness, no masses palpated, no hepatosplenomegaly. Bowel sounds positive.  Musculoskeletal: no clubbing / cyanosis. No joint deformity upper and lower extremities. Good ROM, no contractures, no atrophy. Normal muscle tone.  Skin: no rashes, lesions, ulcers. No induration Neurologic: Sensation intact. Strength 5/5 in all 4  Condition at discharge: good  The results of significant diagnostics from this hospitalization (including imaging, microbiology, ancillary and laboratory) are listed below for reference.   Imaging Studies: ECHOCARDIOGRAM COMPLETE Result Date: 01/24/2024    ECHOCARDIOGRAM REPORT   Patient Name:   Shelley Mahoney Date of Exam: 01/23/2024 Medical Rec #:  [id number]         Height:       67.0 in Accession #:    [id number]        Weight:       192.8 lb Date of Birth:  13-Feb-1932         BSA:          1.991 m Patient Age:    89 years          BP:           102/54 mmHg Patient Gender: F                 HR:           60 bpm. Exam Location:  [facility] Procedure: 2D Echo, Cardiac Doppler and Color Doppler (Both Spectral and Color            Flow Doppler were utilized during procedure). Indications:     R55 Syncope  History:          Patient has no prior history of Echocardiogram examinations.                  Risk Factors:Dyslipidemia.  Sonographer:     Carl Coma RDCS Referring Phys:  [id number] AMY N COX Diagnosing Phys: Evalene Lunger MD IMPRESSIONS  1. Left ventricular ejection fraction, by estimation, is 60 to 65%. The left ventricle has normal function. The left ventricle has no regional wall motion abnormalities.  Left ventricular diastolic parameters are consistent with Grade I diastolic dysfunction (impaired relaxation).  2. Right ventricular systolic function is normal. The right ventricular size is normal. There is normal pulmonary artery systolic pressure. The estimated right ventricular systolic pressure is 35.2 mmHg.  3. The mitral valve is normal in structure. Mild mitral valve regurgitation. No evidence of mitral stenosis.  4. The aortic valve is normal in structure. There is mild calcification of the aortic valve. Aortic valve regurgitation is trivial. Aortic valve sclerosis/calcification is present, without any evidence of aortic stenosis.  5. The inferior vena cava is normal in size with greater than 50% respiratory variability, suggesting right atrial pressure of 3 mmHg. FINDINGS  Left Ventricle: Left ventricular ejection fraction, by estimation, is 60 to 65%. The left ventricle has normal function. The left ventricle has no regional wall motion abnormalities. Strain was performed and the global longitudinal strain is indeterminate. The left ventricular internal cavity size was normal in size. There is no left ventricular hypertrophy. Left ventricular diastolic parameters are consistent with Grade I diastolic dysfunction (impaired relaxation). Right Ventricle: The right ventricular size is normal. No increase in right ventricular wall thickness. Right ventricular systolic function is normal. There is normal pulmonary artery systolic pressure. The tricuspid regurgitant velocity is 2.75 m/s, and  with an  assumed right atrial pressure of 5 mmHg, the estimated right ventricular systolic pressure is 35.2 mmHg. Left Atrium: Left atrial size was normal in size. Right Atrium: Right atrial size was normal in size. Pericardium: There is no evidence of pericardial effusion. Mitral Valve: The mitral valve is normal in structure. Mild mitral annular calcification. Mild mitral valve regurgitation. No evidence of mitral valve stenosis. Tricuspid Valve: The tricuspid valve is normal in structure. Tricuspid valve regurgitation is mild . No evidence of tricuspid stenosis. Aortic Valve: The aortic valve is normal in structure. There is mild calcification of the aortic valve. Aortic valve regurgitation is trivial. Aortic valve sclerosis/calcification is present, without any evidence of aortic stenosis. Pulmonic Valve: The pulmonic valve was normal in structure. Pulmonic valve regurgitation is not visualized. No evidence of pulmonic stenosis. Aorta: The aortic root is normal in size and structure. Venous: The inferior vena cava is normal in size with greater than 50% respiratory variability, suggesting right atrial pressure of 3 mmHg. IAS/Shunts: No atrial level shunt detected by color flow Doppler. Additional Comments: 3D was performed not requiring image post processing on an independent workstation and was indeterminate.  LEFT VENTRICLE PLAX 2D LVIDd:         4.50 cm   Diastology LVIDs:         3.10 cm   LV e' medial:    9.64 cm/s LV PW:         1.00 cm   LV E/e' medial:  9.1 LV IVS:        1.10 cm   LV e' lateral:   8.83 cm/s LVOT diam:     1.90 cm   LV E/e' lateral: 9.9 LV SV:         59 LV SV Index:   30 LVOT Area:     2.84 cm  RIGHT VENTRICLE             IVC RV Basal diam:  3.70 cm     IVC diam: 1.90 cm RV S prime:     19.85 cm/s TAPSE (M-mode): 2.5 cm LEFT ATRIUM             Index  RIGHT ATRIUM           Index LA diam:        4.10 cm 2.06 cm/m   RA Area:     12.30 cm LA Vol (A2C):   30.0 ml 15.07 ml/m  RA Volume:    30.40 ml  15.27 ml/m LA Vol (A4C):   66.7 ml 33.50 ml/m LA Biplane Vol: 49.5 ml 24.86 ml/m  AORTIC VALVE LVOT Vmax:   88.90 cm/s LVOT Vmean:  62.550 cm/s LVOT VTI:    0.209 m  AORTA Ao Root diam: 2.80 cm MITRAL VALVE                TRICUSPID VALVE MV Area (PHT): 3.21 cm     TR Peak grad:   30.2 mmHg MV Decel Time: 236 msec     TR Vmax:        275.00 cm/s MV E velocity: 87.50 cm/s MV A velocity: 107.00 cm/s  SHUNTS MV E/A ratio:  0.82         Systemic VTI:  0.21 m                             Systemic Diam: 1.90 cm Evalene Lunger MD Electronically signed by Evalene Lunger MD Signature Date/Time: 01/24/2024/2:52:12 PM    Final    CT HEAD WO CONTRAST ( ) Result Date: 01/24/2024 EXAM: CT HEAD WITHOUT CONTRAST 01/24/2024 08:59:19 AM TECHNIQUE: CT of the head was performed without the administration of intravenous contrast. Automated exposure control, iterative reconstruction, and/or weight based adjustment of the mA/kV was utilized to reduce the radiation dose to as low as reasonably achievable. COMPARISON: CT of the head dated 01/23/2024. CLINICAL HISTORY: Neuro deficit, acute, stroke suspected. F/u FINDINGS: BRAIN AND VENTRICLES: No acute hemorrhage. No evidence of acute infarct. No hydrocephalus. No extra-axial collection. No mass effect or midline shift. There is mild leukomalacia along the left deep brain stimulator lead. There is age-related cerebral and cerebellar volume loss. ORBITS: Patient is status post bilateral lens replacement. SINUSES: No acute abnormality. SOFT TISSUES AND SKULL: No acute soft tissue abnormality. No skull fracture. There are bilateral deep brain stimulator leads demonstrated terminating the thalami. IMPRESSION: 1. No acute intracranial abnormality. 2. Bilateral deep brain stimulator leads terminating in the thalami with mild leukomalacia along the left lead. 3. Age-related cerebral and cerebellar volume loss. Electronically signed by: Evalene Coho MD 01/24/2024 09:07 AM EDT RP  Workstation: [id number]   X-ray chest PA and lateral Result Date: 01/23/2024 CLINICAL DATA:  Syncope EXAM: CHEST - 2 VIEW COMPARISON:  12/01/2022 FINDINGS: Deep brain stimulator noted with leads extending cephalad in the left neck. Left mid lung scarring along with some hazy densities in the retrodiaphragmatic region, mostly thought to be on the left side but some potentially on the right. Retrodiaphragmatic atelectasis or pneumonia not excluded, correlate with breath sounds. Aspiration pneumonitis is likewise a differential diagnostic consideration. Otherwise the lungs appear clear. Atherosclerotic calcification of the aortic arch. Heart size within normal limits. Degenerative glenohumeral spurring bilaterally. Mild thoracic spondylosis. IMPRESSION: 1. Hazy densities in the retrodiaphragmatic region, mostly thought to be on the left side but some potentially on the right. Retrodiaphragmatic atelectasis or pneumonia not excluded, correlate with breath sounds. Aspiration pneumonitis is likewise a differential diagnostic consideration. 2. Aortic Atherosclerosis (ICD10-I70.0). 3. Degenerative glenohumeral spurring bilaterally. Electronically Signed   By: Ryan Salvage M.D.   On: 01/23/2024 14:28   CT Cervical Spine Wo Contrast  Result Date: 01/23/2024 EXAM: CT CERVICAL SPINE WITHOUT CONTRAST 01/23/2024 10:21:21 AM TECHNIQUE: CT of the cervical spine was performed without the administration of intravenous contrast. Multiplanar reformatted images are provided for review. Automated exposure control, iterative reconstruction, and/or weight based adjustment of the mA/kV was utilized to reduce the radiation dose to as low as reasonably achievable. COMPARISON: None available. CLINICAL HISTORY: Neck trauma (Age >= 65y). Pt in via [facility] from [facility] for a fall at 0730 this morning. Pt states she fell and did hit head on the fridge. Pt states she stood up and her head felt funny and she slowly slid  to the floor. Pt does endorse hitting her head on the fridge. Pt states after she fell she felt tired. Pt denies any blood thinners. Pts speech noted to be slurred. Per pt this is her baseline. BP low at bedside. Per pt that is her baseline. Pt c/o feeling very sleepy. FINDINGS: CERVICAL SPINE: BONES AND ALIGNMENT: Trace anterolisthesis of C4 on C5 and C5 on C6. No facet subluxation or dislocation. Chronic deformity of the T2 superior endplate is similar to prior chest CT from 2024. DEGENERATIVE CHANGES: Osteoarthrosis and uncovertebral hypertrophy at multiple levels. Foraminal stenosis at multiple levels, most pronounced on the left at C3-4. No high-grade osseous spinal canal stenosis. SOFT TISSUES: No prevertebral soft tissue swelling. IMPRESSION: 1. No acute abnormality of the cervical spine related to the reported neck trauma. Electronically signed by: Donnice Mania MD 01/23/2024 10:45 AM EDT RP Workstation: [id number]   CT Head Wo Contrast Result Date: 01/23/2024 EXAM: CT HEAD WITHOUT CONTRAST 01/23/2024 10:21:21 AM TECHNIQUE: CT of the head was performed without the administration of intravenous contrast. Automated exposure control, iterative reconstruction, and/or weight based adjustment of the mA/kV was utilized to reduce the radiation dose to as low as reasonably achievable. COMPARISON: CT head 01/27/2005 CLINICAL HISTORY: Syncope/presyncope, cerebrovascular cause suspected. Pt in via [facility] from [facility] for a fall at 0730 this morning. Pt states she fell and did hit head on the fridge. Pt states she stood up and her head felt funny and she slowly slid to the floor. Pt does endorse hitting her head on the fridge. Pt states after she fell she felt tired. Pt denies any blood thinners. Pts speech noted to be slurred. Per pt this is her baseline. BP low at bedside. Per pt that is her baseline. Pt c/o feeling very sleepy. FINDINGS: BRAIN AND VENTRICLES: No acute hemorrhage. No evidence of  acute infarct. No hydrocephalus. No extra-axial collection. No mass effect or midline shift. Nonspecific hypoattenuation in the periventricular and subcortical white matter, most likely representing chronic small vessel disease. Bilateral deep brain stimulation leads which terminate within the thalami. There is mild encephalomalacia along the course of the left frontal approach deep brain stimulation lead. Streak artifact from extracranial portion of the stimulation leads which slightly limits evaluation of the left temporal and occipital lobes. ORBITS: Bilateral lens replacement. Orbits are otherwise unremarkable. SINUSES: Focal mucosal thickening in the left sphenoid sinus and in the alveolar recess of the left maxillary sinus. SOFT TISSUES AND SKULL: No acute soft tissue abnormality. No skull fracture. IMPRESSION: 1. No acute intracranial abnormality. 2. Bilateral deep brain stimulation leads terminating within the thalami, with mild encephalomalacia along the course of the left frontal approach lead. Electronically signed by: Donnice Mania MD 01/23/2024 10:36 AM EDT RP Workstation: [id number]    Microbiology: Results for orders placed or performed during the hospital encounter of 04/09/21  Resp Panel by RT-PCR (Flu A&B, Covid) Nasopharyngeal Swab     Status: None   Collection Time: 04/09/21 10:49 PM   Specimen: Nasopharyngeal Swab; Nasopharyngeal(NP) swabs in vial transport medium  Result Value Ref Range Status   SARS Coronavirus 2 by RT PCR NEGATIVE NEGATIVE Final    Comment: (NOTE) SARS-CoV-2 target nucleic acids are NOT DETECTED.  The SARS-CoV-2 RNA is generally detectable in upper respiratory specimens during the acute phase of infection. The lowest concentration of SARS-CoV-2 viral copies this assay can detect is 138 copies/mL. A negative result does not preclude SARS-Cov-2 infection and should not be used as the sole basis for treatment or other patient management decisions. A negative  result may occur with  improper specimen collection/handling, submission of specimen other than nasopharyngeal swab, presence of viral mutation(s) within the areas targeted by this assay, and inadequate number of viral copies(<138 copies/mL). A negative result must be combined with clinical observations, patient history, and epidemiological information. The expected result is Negative.  Fact Sheet for Patients:  BloggerCourse.com  Fact Sheet for Healthcare Providers:  SeriousBroker.it  This test is no t yet approved or cleared by the [place]  FDA and  has been authorized for detection and/or diagnosis of SARS-CoV-2 by FDA under an Emergency Use Authorization (EUA). This EUA will remain  in effect (meaning this test can be used) for the duration of the COVID-19 declaration under Section 564(b)(1) of the Act, 21 U.S.C.section 360bbb-3(b)(1), unless the authorization is terminated  or revoked sooner.       Influenza A by PCR NEGATIVE NEGATIVE Final   Influenza B by PCR NEGATIVE NEGATIVE Final    Comment: (NOTE) The Xpert Xpress SARS-CoV-2/FLU/RSV plus assay is intended as an aid in the diagnosis of influenza from Nasopharyngeal swab specimens and should not be used as a sole basis for treatment. Nasal washings and aspirates are unacceptable for Xpert Xpress SARS-CoV-2/FLU/RSV testing.  Fact Sheet for Patients: BloggerCourse.com  Fact Sheet for Healthcare Providers: SeriousBroker.it  This test is not yet approved or cleared by the [place]  FDA and has been authorized for detection and/or diagnosis of SARS-CoV-2 by FDA under an Emergency Use Authorization (EUA). This EUA will remain in effect (meaning this test can be used) for the duration of the COVID-19 declaration under Section 564(b)(1) of the Act, 21 U.S.C. section 360bbb-3(b)(1), unless the authorization is  terminated or revoked.  Performed at [facility], [address] [place], [place] [address]     Labs: CBC: Recent Labs  Lab 01/23/24 [phone] 01/24/24 0607  WBC 9.2 7.9  HGB 13.6 12.3  HCT 43.1 38.4  MCV 93.7 92.8  PLT 190 172   Basic Metabolic Panel: Recent Labs  Lab 01/23/24 0926 01/24/24 0607  NA 142 140  K 4.3 4.0  CL 104 108  CO2 28 25  GLUCOSE 101* 77  BUN 32* 22  CREATININE 1.13* 0.89  CALCIUM 9.2 8.2*   Liver Function Tests: Recent Labs  Lab 01/23/24 0926  AST 26  ALT 20  ALKPHOS 58  BILITOT 0.3  PROT 7.3  ALBUMIN 4.0   CBG: Recent Labs  Lab 01/23/24 1648 01/24/24 0453  GLUCAP 137* 82    Discharge time spent: greater than 30 minutes.  Signed: Laree Lock, MD [facility] 01/24/2024

## 2024-01-24 NOTE — Care Management Obs Status (Signed)
 MEDICARE OBSERVATION STATUS NOTIFICATION   Patient Details  Name: Shelley Mahoney MRN: [id number] Date of Birth: 07-17-1931   Medicare Observation Status Notification Given:  Yes (Telephoned sister Shelley Mahoney [phone] she did not need a copy)    Shelley Mahoney 01/24/2024, 10:11 AM

## 2024-01-24 NOTE — Plan of Care (Signed)
  Problem: Education: Goal: Knowledge of condition and prescribed therapy will improve Outcome: Progressing   Problem: Cardiac: Goal: Will achieve and/or maintain adequate cardiac output Outcome: Progressing   Problem: Physical Regulation: Goal: Complications related to the disease process, condition or treatment will be avoided or minimized Outcome: Progressing   Problem: Activity: Goal: Risk for activity intolerance will decrease Outcome: Progressing   Problem: Nutrition: Goal: Adequate nutrition will be maintained Outcome: Progressing   Problem: Elimination: Goal: Will not experience complications related to bowel motility Outcome: Progressing Goal: Will not experience complications related to urinary retention Outcome: Progressing   Problem: Safety: Goal: Ability to remain free from injury will improve Outcome: Progressing

## 2024-01-24 NOTE — Evaluation (Signed)
 Occupational Therapy Evaluation Patient Details Name: Shelley Mahoney MRN: [id number] DOB: 08/23/1931 Today's Date: 01/24/2024   History of Present Illness   Pt is a 89 y/o F presenting to ED after sustaining a fall with syncope at home. CT head and neck negative. PMH significant for CKD, HLD, hypothyroidism, vascular disease, lung cancer, uterine cancer, essential tremor with deep brain stimulator.     Clinical Impressions Pt seen for OT evaluation. Pt pleasant, agreeable. Pt reports being IND with basic ADLs but did endorse more difficulty completing laundry 2/2 back pain with repetative bending. OT facilitated problem solving for laundry management to prevent over exertion, falls, and minimize back strain/pain. Pt identified a couple strategies to trial: smaller loads, spreading laundry out across the week. Pt educated in AE/DME for minimizing bending, improving access to top load washer clothing, and encouraged use of rollator for laundry basket to pull clothing out of the dryer all at once then sit to fold/hang. Pt appreciative of instruction and verbalized understanding and plan to implement learned strategies. No additional acute OT needs. Will sign off.     If plan is discharge home, recommend the following:   Assistance with cooking/housework;Assist for transportation     Functional Status Assessment   Patient has had a recent decline in their functional status and demonstrates the ability to make significant improvements in function in a reasonable and predictable amount of time.     Equipment Recommendations   None recommended by OT     Recommendations for Other Services         Precautions/Restrictions   Precautions Precautions: Fall Recall of Precautions/Restrictions: Intact Restrictions Weight Bearing Restrictions Per Provider Order: No     Mobility Bed Mobility               General bed mobility comments: NT this session, pt in recliner at  start/end of session.    Transfers Overall transfer level: Needs assistance Equipment used: Rolling walker (2 wheels) Transfers: Sit to/from Stand Sit to Stand: Supervision                  Balance Overall balance assessment: Needs assistance, History of Falls Sitting-balance support: Feet supported Sitting balance-Leahy Scale: Good     Standing balance support: No upper extremity supported, During functional activity Standing balance-Leahy Scale: Good                             ADL either performed or assessed with clinical judgement   ADL Overall ADL's : Modified independent                                             Vision         Perception         Praxis         Pertinent Vitals/Pain Pain Assessment Pain Assessment: No/denies pain     Extremity/Trunk Assessment Upper Extremity Assessment Upper Extremity Assessment: Generalized weakness;Overall WFL for tasks assessed (grossly age appropriate)   Lower Extremity Assessment Lower Extremity Assessment: Defer to PT evaluation       Communication Communication Communication: No apparent difficulties   Cognition Arousal: Alert Behavior During Therapy: WFL for tasks assessed/performed Cognition: No apparent impairments  Following commands: Intact       Cueing  General Comments   Cueing Techniques: Verbal cues;Visual cues      Exercises Other Exercises Other Exercises: OT facilitated problem solving for laundry management to prevent over exertion, falls, and minimize back strain/pain. Educated in AE/DME and compensatory strategies/positioning to improve comfort and safety.   Shoulder Instructions      Home Living Family/patient expects to be discharged to:: Private residence Living Arrangements: Alone Available Help at Discharge: Friend(s);Available PRN/intermittently Type of Home: Apartment Home Access: Stairs  to enter Entrance Stairs-Number of Steps: 1 clearance Entrance Stairs-Rails: None Home Layout: One level     Bathroom Shower/Tub: Walk-in shower         Home Equipment: [place] - single point (walking stick)   Additional Comments: Pt lives at [facility] independent living      Prior Functioning/Environment Prior Level of Function : Independent/Modified Independent             Mobility Comments: Pt reports being amb with walking stick during the day, RW for when she goes to bathroom at night. 1 previous fall in May ADLs Comments: Pt reports being IND with basic ADLs but did endorse difficulty completing laundry 2/2 back pain with repetative bending    OT Problem List: Decreased strength;Decreased activity tolerance;Pain   OT Treatment/Interventions:        OT Goals(Current goals can be found in the care plan section)   Acute Rehab OT Goals Patient Stated Goal: go home OT Goal Formulation: All assessment and education complete, DC therapy   OT Frequency:       Co-evaluation              AM-PAC OT 6 Clicks Daily Activity     Outcome Measure Help from another person eating meals?: None Help from another person taking care of personal grooming?: None Help from another person toileting, which includes using toliet, bedpan, or urinal?: None Help from another person bathing (including washing, rinsing, drying)?: A Little Help from another person to put on and taking off regular upper body clothing?: None Help from another person to put on and taking off regular lower body clothing?: None 6 Click Score: 23   End of Session    Activity Tolerance: Patient tolerated treatment well Patient left: in chair;with call bell/phone within reach;with chair alarm set  OT Visit Diagnosis: Pain Pain - part of body:  (back)                Time: 8793-8776 OT Time Calculation (min): 17 min Charges:  OT General Charges $OT Visit: 1 Visit OT Evaluation $OT Eval Low  Complexity: 1 Low OT Treatments $Self Care/Home Management : 8-22 mins  Warren SAUNDERS., MPH, MS, OTR/L ascom [phone] 01/24/24, 1:46 PM

## 2024-01-24 NOTE — Plan of Care (Signed)
  Problem: Education: Goal: Knowledge of condition and prescribed therapy will improve Outcome: Progressing   Problem: Cardiac: Goal: Will achieve and/or maintain adequate cardiac output Outcome: Progressing   Problem: Physical Regulation: Goal: Complications related to the disease process, condition or treatment will be avoided or minimized Outcome: Progressing   Problem: Education: Goal: Knowledge of General Education information will improve Description: Including pain rating scale, medication(s)/side effects and non-pharmacologic comfort measures Outcome: Progressing   Problem: Health Behavior/Discharge Planning: Goal: Ability to manage health-related needs will improve Outcome: Progressing   Problem: Clinical Measurements: Goal: Ability to maintain clinical measurements within normal limits will improve Outcome: Progressing Goal: Will remain free from infection Outcome: Progressing Goal: Diagnostic test results will improve Outcome: Progressing Goal: Respiratory complications will improve Outcome: Progressing Goal: Cardiovascular complication will be avoided Outcome: Progressing   Problem: Activity: Goal: Risk for activity intolerance will decrease Outcome: Progressing   Problem: Nutrition: Goal: Adequate nutrition will be maintained Outcome: Progressing   Problem: Coping: Goal: Level of anxiety will decrease Outcome: Progressing   Problem: Elimination: Goal: Will not experience complications related to bowel motility Outcome: Progressing Goal: Will not experience complications related to urinary retention Outcome: Progressing   Problem: Pain Managment: Goal: General experience of comfort will improve and/or be controlled Outcome: Progressing   Problem: Safety: Goal: Ability to remain free from injury will improve Outcome: Progressing   Problem: Skin Integrity: Goal: Risk for impaired skin integrity will decrease Outcome: Progressing   Problem:  Education: Goal: Knowledge of General Education information will improve Description: Including pain rating scale, medication(s)/side effects and non-pharmacologic comfort measures Outcome: Progressing

## 2024-01-24 NOTE — Progress Notes (Signed)
 Orthostatic vitals   01/24/24 1100  Orthostatic Lying   BP- Lying 113/55  Pulse- Lying 69  Orthostatic Sitting  BP- Sitting 109/55  Pulse- Sitting 71  Orthostatic Standing at 0 minutes  BP- Standing at 0 minutes 108/52  Pulse- Standing at 0 minutes 80  Orthostatic Standing at 3 minutes  BP- Standing at 3 minutes 123/55  Pulse- Standing at 3 minutes 73

## 2024-01-24 NOTE — TOC Initial Note (Addendum)
 Transition of Care [facility]) - Initial/Assessment Note    Patient Details  Name: Shelley Mahoney MRN: [id number] Date of Birth: April 14, 1932  Transition of Care [facility]) CM/SW Contact:    Dalia GORMAN Fuse, RN Phone Number: 01/24/2024, 11:06 AM  Clinical Narrative:                 TOC spoke with the patient in the room. She is from Independent Living at [facility]. She is drives and is able to get to and from appoinments and to run errands. She has a PCP and uses [facility]. She occasionally uses a walking stick to assist with ambulation. Her friend who also lives at [facility] will drive her home at discharge. No TOC needs at this time.    Barriers to Discharge: Continued Medical Work up   Patient Goals and CMS Choice            Expected Discharge Plan and Services   Discharge Planning Services: CM Consult   Living arrangements for the past 2 months: Single Family Home                                      Prior Living Arrangements/Services Living arrangements for the past 2 months: Single Family Home Lives with:: Self                   Activities of Daily Living   ADL Screening (condition at time of admission) Independently performs ADLs?: No Does the patient have a NEW difficulty with bathing/dressing/toileting/self-feeding that is expected to last >3 days?: Yes (Initiates electronic notice to provider for possible OT consult) Does the patient have a NEW difficulty with getting in/out of bed, walking, or climbing stairs that is expected to last >3 days?: Yes (Initiates electronic notice to provider for possible PT consult) Does the patient have a NEW difficulty with communication that is expected to last >3 days?: No Is the patient deaf or have difficulty hearing?: No Does the patient have difficulty seeing, even when wearing glasses/contacts?: No Does the patient have difficulty concentrating, remembering, or making decisions?: No  Permission Sought/Granted                   Emotional Assessment              Admission diagnosis:  Syncope [R55] Patient Active Problem List   Diagnosis Date Noted   Syncope 01/23/2024   AKI (acute kidney injury) (HCC) 01/23/2024   Hypothyroidism 01/23/2024   Small bowel obstruction (HCC) 11/29/2022   Partial small bowel obstruction (HCC) 11/29/2022   Melanoma (HCC) 08/08/2022   Spondylosis without myelopathy or radiculopathy, lumbosacral region 04/24/2022    Class: Chronic   Dextroscoliosis of lumbar spine 04/24/2022    Class: Chronic   Lumbar facet joint pain 04/24/2022    Class: Chronic   Lumbosacral facet syndrome 04/24/2022    Class: Chronic   Chronic feet pain (2ry area of Pain) (Bilateral) 04/24/2022    Class: Chronic   Balance problem 04/24/2022    Class: Chronic   Abnormal NCS (nerve conduction studies) (LE) (04/04/2022) 04/24/2022   Sensory polyneuropathy (lower extremites) 04/24/2022    Class: Chronic   Abnormal CT scan, lumbar spine (10/28/2019) 02/01/2022   Grade 1 Anterolisthesis of lumbosacral spine (L5/S1) 02/01/2022   Chronic mid back pain 02/01/2022   Lumbar scoliosis due to degenerative disease of spine in adult 02/01/2022  Lumbar spondylolisthesis (L5-S1 3 mm Anterolisthesis) (Right: L3-4 9 mm translation) 02/01/2022   Lumbar facet hypertrophy (Multilevel) (Bilateral) 02/01/2022   Lumbar facet joint osteoarthritis 02/01/2022   DDD (degenerative disc disease), lumbar 02/01/2022   DJD (degenerative joint disease), lumbosacral 02/01/2022   Ligamentum flavum hypertrophy (L2-3, L3-4, L4-5) 02/01/2022   Lumbosacral foraminal stenosis (Left: L2-3) (Bilateral: L3-4) (L>R: L4-5, L5-S1) 02/01/2022   Lumbosacral lateral recess stenosis (Left: L2-3) (Bilateral: L3-4, L4-5, L5-S1) 02/01/2022   Chronic pain syndrome 01/31/2022   Pharmacologic therapy 01/31/2022   Disorder of skeletal system 01/31/2022   Problems influencing health status 01/31/2022   Lung cancer (HCC) 12/08/2020   Strain of  right knee 10/25/2020   Status post deep brain stimulator placement 01/29/2020   Chronic low back pain (1ry area of Pain) (Bilateral) (R=L) w/o sciatica 11/07/2019   Lumbar stenosis without neurogenic claudication 11/07/2019   History of skin cancer 08/04/2019   Prediabetes 07/29/2019   Aortic atherosclerosis (HCC) 07/29/2018   Dysphonia of essential tremor 05/26/2016   Malignant neoplasm of upper lobe of left lung (HCC) 05/24/2016   Primary cancer of left upper lobe of lung (HCC) 04/28/2016   Iron deficiency 04/20/2016   Periodic limb movement sleep disorder 04/20/2016   Mass of upper lobe of left lung 04/11/2016   Health care maintenance 02/08/2016   Restless legs syndrome 01/13/2016   Primary insomnia 12/01/2014   CKD (chronic kidney disease) stage 3, GFR 30-59 ml/min (HCC) 11/18/2013   Carotid artery disease (HCC) 11/02/2013   Hypothyroid 11/02/2013   Neuropathy 11/02/2013   Essential tremor 11/02/2013   Vocal tremor 01/21/2013   PCP:  Lenon Layman ORN, MD Pharmacy:   [phone] [address], [facility] - [address] [place] [address] Phone: [phone] Fax: [phone]     Social Drivers of Health (SDOH) Social History: SDOH Screenings   Food Insecurity: No Food Insecurity (01/23/2024)  Housing: Low Risk  (01/23/2024)  Transportation Needs: No Transportation Needs (01/23/2024)  Utilities: Not At Risk (01/23/2024)  Depression (PHQ2-9): Low Risk  (08/29/2022)  Financial Resource Strain: Low Risk  (09/27/2023)   Received from [facility]  Social Connections: Patient Declined (01/23/2024)  Tobacco Use: Low Risk  (01/23/2024)   SDOH Interventions:     Readmission Risk Interventions     No data to display

## 2024-01-24 NOTE — Progress Notes (Signed)
 PHARMACIST - PHYSICIAN COMMUNICATION  CONCERNING:  Enoxaparin  (Lovenox ) for DVT Prophylaxis   ASSESSMENT: Patient was prescribed enoxaparin  0.5 mg/kg subcutaneously every 24 hours for VTE prophylaxis.   Body mass index is 29.59 kg/m.  Estimated Creatinine Clearance: 47.2 mL/min (by C-G formula based on SCr of 0.89 mg/dL).  Based on [facility] policy, patient qualifies for enoxaparin  dosing of 40 mg every 24 hours because their creatinine clearance is >30 mL/min and their BMI is <30  PLAN: Pharmacy has adjusted enoxaparin  dose per [facility] policy.  Description: Patient is now receiving enoxaparin  40 mg subcutaneously every 24 hours.  Will M. Lenon, PharmD Clinical Pharmacist 01/24/2024 2:10 PM

## 2024-04-08 ENCOUNTER — Ambulatory Visit: Admitting: Dermatology

## 2024-04-08 DIAGNOSIS — Z1283 Encounter for screening for malignant neoplasm of skin: Secondary | ICD-10-CM

## 2024-04-08 DIAGNOSIS — L821 Other seborrheic keratosis: Secondary | ICD-10-CM

## 2024-04-08 DIAGNOSIS — D2262 Melanocytic nevi of left upper limb, including shoulder: Secondary | ICD-10-CM

## 2024-04-08 DIAGNOSIS — L814 Other melanin hyperpigmentation: Secondary | ICD-10-CM | POA: Diagnosis not present

## 2024-04-08 DIAGNOSIS — L578 Other skin changes due to chronic exposure to nonionizing radiation: Secondary | ICD-10-CM | POA: Diagnosis not present

## 2024-04-08 DIAGNOSIS — D485 Neoplasm of uncertain behavior of skin: Secondary | ICD-10-CM | POA: Diagnosis not present

## 2024-04-08 DIAGNOSIS — W908XXA Exposure to other nonionizing radiation, initial encounter: Secondary | ICD-10-CM

## 2024-04-08 DIAGNOSIS — D1801 Hemangioma of skin and subcutaneous tissue: Secondary | ICD-10-CM

## 2024-04-08 DIAGNOSIS — D229 Melanocytic nevi, unspecified: Secondary | ICD-10-CM

## 2024-04-08 DIAGNOSIS — D225 Melanocytic nevi of trunk: Secondary | ICD-10-CM

## 2024-04-08 DIAGNOSIS — Z86018 Personal history of other benign neoplasm: Secondary | ICD-10-CM

## 2024-04-08 DIAGNOSIS — Z85820 Personal history of malignant melanoma of skin: Secondary | ICD-10-CM

## 2024-04-08 DIAGNOSIS — D2272 Melanocytic nevi of left lower limb, including hip: Secondary | ICD-10-CM

## 2024-04-08 DIAGNOSIS — L820 Inflamed seborrheic keratosis: Secondary | ICD-10-CM | POA: Diagnosis not present

## 2024-04-08 DIAGNOSIS — D2271 Melanocytic nevi of right lower limb, including hip: Secondary | ICD-10-CM

## 2024-04-08 NOTE — Progress Notes (Signed)
 Follow-Up Visit   Subjective  Shelley Mahoney is a 89 y.o. female who presents for the following: Skin Cancer Screening and Full Body Skin Exam.   The patient presents for Total-Body Skin Exam (TBSE) for skin cancer screening and mole check. The patient has spots, moles and lesions to be evaluated, some may be new or changing. Irritated growths on right side and L neck.  Catches on clothing.  The following portions of the chart were reviewed this encounter and updated as appropriate: medications, allergies, medical history  Review of Systems:  No other skin or systemic complaints except as noted in HPI or Assessment and Plan.  Objective  Well appearing patient in no apparent distress; mood and affect are within normal limits.  A full examination was performed including scalp, head, eyes, ears, nose, lips, neck, chest, axillae, abdomen, back, buttocks, bilateral upper extremities, bilateral lower extremities, hands, feet, fingers, toes, fingernails, and toenails. All findings within normal limits unless otherwise noted below.   Relevant physical exam findings are noted in the Assessment and Plan.  Right Hip 4 x 3.5 cm verrucous waxy plaque L lower neck (7) Erythematous stuck-on, waxy papules  Assessment & Plan   SKIN CANCER SCREENING PERFORMED TODAY.  ACTINIC DAMAGE - Chronic condition, secondary to cumulative UV/sun exposure - diffuse scaly erythematous macules with underlying dyspigmentation - Recommend daily broad spectrum sunscreen SPF 30+ to sun-exposed areas, reapply every 2 hours as needed.  - Staying in the shade or wearing long sleeves, sun glasses (UVA+UVB protection) and wide brim hats (4-inch brim around the entire circumference of the hat) are also recommended for sun protection.  - Call for new or changing lesions.  LENTIGINES, SEBORRHEIC KERATOSES, HEMANGIOMAS - Benign normal skin lesions - Benign-appearing - Call for any changes  MELANOCYTIC NEVI -  Tan-brown and/or pink-flesh-colored symmetric macules and papules - Left buttock x 5, 3-15mm brown macules  - Right 2nd Toe 3.78mm regular brown macule - Left great toe 3 mm light brown macule  - Left posterior shoulder 4 x 2 mm medium dark brown macule  - Left medial popliteal  4 x 3 mm medium dark brown macule  - Right mid back 3 mm medium dark brown macule  - Benign appearing on exam today - Observation - Call clinic for new or changing moles - Recommend daily use of broad spectrum spf 30+ sunscreen to sun-exposed areas.   HISTORY OF MELANOMA Left posterior upper arm , Breslow 0.4 mm, level III, WLE 08/23/22 Margins Free, [place] testing Class 1A  - No evidence of recurrence today - No lymphadenopathy - Recommend regular full body skin exams - Recommend daily broad spectrum sunscreen SPF 30+ to sun-exposed areas, reapply every 2 hours as needed.  - Call if any new or changing lesions are noted between office visits   HISTORY OF DYSPLASTIC NEVUS Right upper forehead severe atypia 09/22/19 No evidence of recurrence today Recommend regular full body skin exams Recommend daily broad spectrum sunscreen SPF 30+ to sun-exposed areas, reapply every 2 hours as needed.  Call if any new or changing lesions are noted between office visits   NEOPLASM OF UNCERTAIN BEHAVIOR OF SKIN Right Hip Epidermal / dermal shaving  Lesion diameter (cm):  4 Informed consent: discussed and consent obtained   Patient was prepped and draped in usual sterile fashion: Area prepped with alcohol. Anesthesia: the lesion was anesthetized in a standard fashion   Anesthetic:  1% lidocaine  w/ epinephrine 1-100,000 buffered w/ 8.4% NaHCO3 Instrument used:  flexible razor blade   Hemostasis achieved with: pressure, aluminum chloride and electrodesiccation   Outcome: patient tolerated procedure well   Post-procedure details: wound care instructions given   Post-procedure details comment:  Ointment and small bandage  applied  Specimen 1 - Surgical pathology Differential Diagnosis: Irritated SK Check Margins: No INFLAMED SEBORRHEIC KERATOSIS (7) L lower neck (7) Symptomatic, irritating, patient would like treated. Destruction of lesion - L lower neck (7)  Destruction method: cryotherapy   Informed consent: discussed and consent obtained   Lesion destroyed using liquid nitrogen: Yes   Region frozen until ice ball extended beyond lesion: Yes   Outcome: patient tolerated procedure well with no complications   Post-procedure details: wound care instructions given   Additional details:  Prior to procedure, discussed risks of blister formation, small wound, skin dyspigmentation, or rare scar following cryotherapy. Recommend Vaseline ointment to treated areas while healing.   Return in about 6 months (around 10/06/2024) for TBSE, Hx melanoma.  IAndrea Kerns, CMA, am acting as scribe for Rexene Rattler, MD .   Documentation: I have reviewed the above documentation for accuracy and completeness, and I agree with the above.  Rexene Rattler, MD

## 2024-04-08 NOTE — Patient Instructions (Addendum)

## 2024-04-10 LAB — SURGICAL PATHOLOGY

## 2024-04-14 ENCOUNTER — Ambulatory Visit: Payer: Self-pay | Admitting: Dermatology

## 2024-04-14 NOTE — Telephone Encounter (Signed)
 Advised pt of bx results/sh ?

## 2024-04-14 NOTE — Telephone Encounter (Signed)
-----   Message from Rexene Rattler sent at 04/14/2024 10:34 AM EST -----  1. Skin (M), right hip :       SEBORRHEIC KERATOSIS, IRRITATED, INFLAMED   Benign ISK - please call patient ----- Message ----- From: Interface, Lab In [facility] Sent: 04/10/2024   5:29 PM EST To: Rexene Rattler, MD

## 2024-10-07 ENCOUNTER — Ambulatory Visit: Admitting: Dermatology
